# Patient Record
Sex: Female | Born: 1950 | Race: White | Hispanic: No | Marital: Married | Smoking: Never smoker
Health system: Southern US, Community
[De-identification: ages and names within clinical notes are randomized; demographics above are authoritative.]

## PROBLEM LIST (undated history)

## (undated) DIAGNOSIS — F419 Anxiety disorder, unspecified: Secondary | ICD-10-CM

## (undated) DIAGNOSIS — K5790 Diverticulosis of intestine, part unspecified, without perforation or abscess without bleeding: Secondary | ICD-10-CM

## (undated) DIAGNOSIS — M549 Dorsalgia, unspecified: Secondary | ICD-10-CM

## (undated) DIAGNOSIS — M1712 Unilateral primary osteoarthritis, left knee: Secondary | ICD-10-CM

## (undated) DIAGNOSIS — R569 Unspecified convulsions: Secondary | ICD-10-CM

## (undated) DIAGNOSIS — K589 Irritable bowel syndrome without diarrhea: Secondary | ICD-10-CM

## (undated) DIAGNOSIS — M8580 Other specified disorders of bone density and structure, unspecified site: Secondary | ICD-10-CM

## (undated) DIAGNOSIS — D509 Iron deficiency anemia, unspecified: Secondary | ICD-10-CM

## (undated) DIAGNOSIS — E782 Mixed hyperlipidemia: Secondary | ICD-10-CM

## (undated) DIAGNOSIS — G40209 Localization-related (focal) (partial) symptomatic epilepsy and epileptic syndromes with complex partial seizures, not intractable, without status epilepticus: Secondary | ICD-10-CM

## (undated) DIAGNOSIS — E559 Vitamin D deficiency, unspecified: Secondary | ICD-10-CM

## (undated) DIAGNOSIS — M1990 Unspecified osteoarthritis, unspecified site: Secondary | ICD-10-CM

## (undated) DIAGNOSIS — E538 Deficiency of other specified B group vitamins: Secondary | ICD-10-CM

## (undated) DIAGNOSIS — I10 Essential (primary) hypertension: Secondary | ICD-10-CM

## (undated) HISTORY — PX: FOOT SURGERY: SHX648

## (undated) HISTORY — DX: Diverticulosis of intestine, part unspecified, without perforation or abscess without bleeding: K57.90

## (undated) HISTORY — PX: MOUTH SURGERY: SHX715

## (undated) HISTORY — DX: Other specified disorders of bone density and structure, unspecified site: M85.80

## (undated) HISTORY — DX: Unspecified convulsions: R56.9

---

## 1981-07-27 HISTORY — PX: BREAST SURGERY: SHX581

## 1996-07-27 HISTORY — PX: AUGMENTATION MAMMAPLASTY: SUR837

## 1997-07-27 HISTORY — PX: GYNECOLOGIC CRYOSURGERY: SHX857

## 1997-07-27 HISTORY — PX: OTHER SURGICAL HISTORY: SHX169

## 2000-09-18 ENCOUNTER — Encounter: Payer: Self-pay | Admitting: Family Medicine

## 2000-09-18 LAB — CONVERTED CEMR LAB: Pap Smear: NORMAL

## 2000-10-11 ENCOUNTER — Encounter: Payer: Self-pay | Admitting: Family Medicine

## 2000-10-11 LAB — CONVERTED CEMR LAB: Blood Glucose, Fasting: 98 mg/dL

## 2000-10-18 ENCOUNTER — Other Ambulatory Visit: Admission: RE | Admit: 2000-10-18 | Discharge: 2000-10-18 | Payer: Self-pay | Admitting: Family Medicine

## 2001-11-22 ENCOUNTER — Encounter: Payer: Self-pay | Admitting: Family Medicine

## 2001-11-22 ENCOUNTER — Other Ambulatory Visit: Admission: RE | Admit: 2001-11-22 | Discharge: 2001-11-22 | Payer: Self-pay | Admitting: Family Medicine

## 2003-03-29 ENCOUNTER — Encounter: Payer: Self-pay | Admitting: Family Medicine

## 2003-03-29 ENCOUNTER — Other Ambulatory Visit: Admission: RE | Admit: 2003-03-29 | Discharge: 2003-03-29 | Payer: Self-pay | Admitting: Family Medicine

## 2003-03-29 LAB — CONVERTED CEMR LAB: Pap Smear: NORMAL

## 2003-03-30 ENCOUNTER — Encounter: Payer: Self-pay | Admitting: Family Medicine

## 2003-03-30 LAB — CONVERTED CEMR LAB: Blood Glucose, Fasting: 78 mg/dL

## 2004-04-18 ENCOUNTER — Encounter: Payer: Self-pay | Admitting: Family Medicine

## 2004-04-18 ENCOUNTER — Other Ambulatory Visit: Admission: RE | Admit: 2004-04-18 | Discharge: 2004-04-18 | Payer: Self-pay | Admitting: Family Medicine

## 2004-04-18 LAB — CONVERTED CEMR LAB
Blood Glucose, Fasting: 74 mg/dL
Pap Smear: NORMAL

## 2004-06-12 ENCOUNTER — Ambulatory Visit: Payer: Self-pay | Admitting: Family Medicine

## 2004-08-08 ENCOUNTER — Ambulatory Visit: Payer: Self-pay | Admitting: Family Medicine

## 2004-08-08 LAB — CONVERTED CEMR LAB: TSH: 1.83 microintl units/mL

## 2004-09-02 ENCOUNTER — Ambulatory Visit: Payer: Self-pay | Admitting: Family Medicine

## 2004-10-07 ENCOUNTER — Ambulatory Visit: Payer: Self-pay | Admitting: Family Medicine

## 2004-10-09 ENCOUNTER — Ambulatory Visit: Payer: Self-pay | Admitting: Family Medicine

## 2005-03-05 ENCOUNTER — Other Ambulatory Visit: Admission: RE | Admit: 2005-03-05 | Discharge: 2005-03-05 | Payer: Self-pay | Admitting: Family Medicine

## 2005-03-05 ENCOUNTER — Ambulatory Visit: Payer: Self-pay | Admitting: Family Medicine

## 2005-03-05 LAB — CONVERTED CEMR LAB: Pap Smear: ABNORMAL

## 2005-03-27 ENCOUNTER — Ambulatory Visit: Payer: Self-pay | Admitting: Family Medicine

## 2005-04-03 ENCOUNTER — Ambulatory Visit: Payer: Self-pay | Admitting: Family Medicine

## 2005-04-03 LAB — CONVERTED CEMR LAB
Blood Glucose, Fasting: 84 mg/dL
RBC count: 4.13 10*6/mm3
WBC, blood: 6.3 10*3/mm3

## 2005-04-07 ENCOUNTER — Ambulatory Visit: Payer: Self-pay | Admitting: Gastroenterology

## 2005-05-01 ENCOUNTER — Ambulatory Visit: Payer: Self-pay | Admitting: Gastroenterology

## 2005-05-01 HISTORY — PX: COLONOSCOPY: SHX174

## 2005-07-27 HISTORY — PX: LASIK: SHX215

## 2005-08-05 ENCOUNTER — Ambulatory Visit: Payer: Self-pay | Admitting: Family Medicine

## 2005-09-07 ENCOUNTER — Ambulatory Visit: Payer: Self-pay | Admitting: Family Medicine

## 2005-09-07 ENCOUNTER — Other Ambulatory Visit: Admission: RE | Admit: 2005-09-07 | Discharge: 2005-09-07 | Payer: Self-pay | Admitting: Family Medicine

## 2005-09-07 LAB — CONVERTED CEMR LAB: Pap Smear: NORMAL

## 2006-10-13 ENCOUNTER — Ambulatory Visit: Payer: Self-pay | Admitting: Family Medicine

## 2006-10-13 ENCOUNTER — Encounter (INDEPENDENT_AMBULATORY_CARE_PROVIDER_SITE_OTHER): Payer: Self-pay | Admitting: *Deleted

## 2006-10-13 ENCOUNTER — Other Ambulatory Visit: Admission: RE | Admit: 2006-10-13 | Discharge: 2006-10-13 | Payer: Self-pay | Admitting: Family Medicine

## 2006-10-13 LAB — CONVERTED CEMR LAB
ALT: 26 units/L (ref 0–40)
AST: 24 units/L (ref 0–37)
Basophils Relative: 0.2 % (ref 0.0–1.0)
Bilirubin, Direct: 0.1 mg/dL (ref 0.0–0.3)
Calcium: 9.2 mg/dL (ref 8.4–10.5)
Chloride: 106 meq/L (ref 96–112)
Creatinine, Ser: 0.6 mg/dL (ref 0.4–1.2)
Eosinophils Absolute: 0 10*3/uL (ref 0.0–0.6)
Eosinophils Relative: 0.4 % (ref 0.0–5.0)
GFR calc non Af Amer: 110 mL/min
Glucose, Bld: 88 mg/dL (ref 70–99)
HCT: 39.1 % (ref 36.0–46.0)
LDL Cholesterol: 111 mg/dL — ABNORMAL HIGH (ref 0–99)
Neutrophils Relative %: 54.6 % (ref 43.0–77.0)
Platelets: 297 10*3/uL (ref 150–400)
RBC count: 4.33 10*6/uL
RBC: 4.33 M/uL (ref 3.87–5.11)
RDW: 12.9 % (ref 11.5–14.6)
Sodium: 141 meq/L (ref 135–145)
TSH: 2.64 microintl units/mL (ref 0.35–5.50)
Total Bilirubin: 0.8 mg/dL (ref 0.3–1.2)
Total CHOL/HDL Ratio: 2.9
Triglycerides: 65 mg/dL (ref 0–149)
VLDL: 13 mg/dL (ref 0–40)
WBC: 6.3 10*3/uL (ref 4.5–10.5)

## 2006-11-11 ENCOUNTER — Ambulatory Visit: Payer: Self-pay | Admitting: Family Medicine

## 2006-12-01 ENCOUNTER — Telehealth (INDEPENDENT_AMBULATORY_CARE_PROVIDER_SITE_OTHER): Payer: Self-pay | Admitting: Internal Medicine

## 2006-12-13 ENCOUNTER — Telehealth (INDEPENDENT_AMBULATORY_CARE_PROVIDER_SITE_OTHER): Payer: Self-pay | Admitting: Internal Medicine

## 2006-12-14 ENCOUNTER — Ambulatory Visit: Payer: Self-pay | Admitting: Family Medicine

## 2007-01-10 ENCOUNTER — Telehealth (INDEPENDENT_AMBULATORY_CARE_PROVIDER_SITE_OTHER): Payer: Self-pay | Admitting: Internal Medicine

## 2007-01-12 ENCOUNTER — Ambulatory Visit: Payer: Self-pay | Admitting: Family Medicine

## 2007-01-12 DIAGNOSIS — M545 Low back pain, unspecified: Secondary | ICD-10-CM | POA: Insufficient documentation

## 2007-01-14 ENCOUNTER — Encounter: Admission: RE | Admit: 2007-01-14 | Discharge: 2007-01-14 | Payer: Self-pay | Admitting: Family Medicine

## 2007-03-09 ENCOUNTER — Telehealth (INDEPENDENT_AMBULATORY_CARE_PROVIDER_SITE_OTHER): Payer: Self-pay | Admitting: Internal Medicine

## 2007-04-04 ENCOUNTER — Encounter (INDEPENDENT_AMBULATORY_CARE_PROVIDER_SITE_OTHER): Payer: Self-pay | Admitting: Internal Medicine

## 2007-08-11 ENCOUNTER — Encounter (INDEPENDENT_AMBULATORY_CARE_PROVIDER_SITE_OTHER): Payer: Self-pay | Admitting: Internal Medicine

## 2007-11-17 ENCOUNTER — Encounter: Admission: RE | Admit: 2007-11-17 | Discharge: 2007-11-17 | Payer: Self-pay | Admitting: Orthopaedic Surgery

## 2007-12-06 ENCOUNTER — Encounter (INDEPENDENT_AMBULATORY_CARE_PROVIDER_SITE_OTHER): Payer: Self-pay | Admitting: Internal Medicine

## 2007-12-16 ENCOUNTER — Ambulatory Visit: Payer: Self-pay | Admitting: Family Medicine

## 2007-12-16 ENCOUNTER — Other Ambulatory Visit: Admission: RE | Admit: 2007-12-16 | Discharge: 2007-12-16 | Payer: Self-pay | Admitting: Family Medicine

## 2007-12-16 ENCOUNTER — Encounter (INDEPENDENT_AMBULATORY_CARE_PROVIDER_SITE_OTHER): Payer: Self-pay | Admitting: Internal Medicine

## 2007-12-19 ENCOUNTER — Ambulatory Visit: Payer: Self-pay | Admitting: Family Medicine

## 2007-12-20 ENCOUNTER — Encounter (INDEPENDENT_AMBULATORY_CARE_PROVIDER_SITE_OTHER): Payer: Self-pay | Admitting: Internal Medicine

## 2007-12-21 ENCOUNTER — Ambulatory Visit: Payer: Self-pay | Admitting: Family Medicine

## 2007-12-21 ENCOUNTER — Encounter (INDEPENDENT_AMBULATORY_CARE_PROVIDER_SITE_OTHER): Payer: Self-pay | Admitting: Internal Medicine

## 2007-12-21 DIAGNOSIS — M8580 Other specified disorders of bone density and structure, unspecified site: Secondary | ICD-10-CM

## 2007-12-21 HISTORY — DX: Other specified disorders of bone density and structure, unspecified site: M85.80

## 2007-12-22 ENCOUNTER — Encounter (INDEPENDENT_AMBULATORY_CARE_PROVIDER_SITE_OTHER): Payer: Self-pay | Admitting: Internal Medicine

## 2007-12-22 ENCOUNTER — Encounter (INDEPENDENT_AMBULATORY_CARE_PROVIDER_SITE_OTHER): Payer: Self-pay | Admitting: *Deleted

## 2007-12-23 ENCOUNTER — Encounter (INDEPENDENT_AMBULATORY_CARE_PROVIDER_SITE_OTHER): Payer: Self-pay | Admitting: *Deleted

## 2007-12-23 ENCOUNTER — Encounter (INDEPENDENT_AMBULATORY_CARE_PROVIDER_SITE_OTHER): Payer: Self-pay | Admitting: Internal Medicine

## 2007-12-27 ENCOUNTER — Encounter (INDEPENDENT_AMBULATORY_CARE_PROVIDER_SITE_OTHER): Payer: Self-pay | Admitting: Internal Medicine

## 2007-12-27 ENCOUNTER — Encounter: Payer: Self-pay | Admitting: Family Medicine

## 2007-12-27 ENCOUNTER — Encounter (INDEPENDENT_AMBULATORY_CARE_PROVIDER_SITE_OTHER): Payer: Self-pay | Admitting: *Deleted

## 2007-12-27 DIAGNOSIS — M949 Disorder of cartilage, unspecified: Secondary | ICD-10-CM

## 2007-12-27 DIAGNOSIS — M47817 Spondylosis without myelopathy or radiculopathy, lumbosacral region: Secondary | ICD-10-CM | POA: Insufficient documentation

## 2007-12-27 DIAGNOSIS — N951 Menopausal and female climacteric states: Secondary | ICD-10-CM | POA: Insufficient documentation

## 2007-12-27 DIAGNOSIS — M899 Disorder of bone, unspecified: Secondary | ICD-10-CM | POA: Insufficient documentation

## 2007-12-27 DIAGNOSIS — K5730 Diverticulosis of large intestine without perforation or abscess without bleeding: Secondary | ICD-10-CM | POA: Insufficient documentation

## 2007-12-27 DIAGNOSIS — Z978 Presence of other specified devices: Secondary | ICD-10-CM | POA: Insufficient documentation

## 2008-01-09 ENCOUNTER — Encounter: Payer: Self-pay | Admitting: Family Medicine

## 2008-02-08 ENCOUNTER — Telehealth (INDEPENDENT_AMBULATORY_CARE_PROVIDER_SITE_OTHER): Payer: Self-pay | Admitting: Internal Medicine

## 2008-02-25 DIAGNOSIS — R569 Unspecified convulsions: Secondary | ICD-10-CM | POA: Insufficient documentation

## 2008-02-27 ENCOUNTER — Other Ambulatory Visit: Payer: Self-pay

## 2008-02-27 ENCOUNTER — Emergency Department: Payer: Self-pay

## 2008-03-08 ENCOUNTER — Telehealth (INDEPENDENT_AMBULATORY_CARE_PROVIDER_SITE_OTHER): Payer: Self-pay | Admitting: Internal Medicine

## 2008-03-08 LAB — CONVERTED CEMR LAB
ALT: 22 units/L (ref 0–35)
AST: 23 units/L (ref 0–37)
Albumin: 3.9 g/dL (ref 3.5–5.2)
Alkaline Phosphatase: 34 units/L — ABNORMAL LOW (ref 39–117)
BUN: 16 mg/dL (ref 6–23)
Basophils Relative: 0.6 % (ref 0.0–1.0)
CO2: 26 meq/L (ref 19–32)
Chloride: 106 meq/L (ref 96–112)
Creatinine, Ser: 0.7 mg/dL (ref 0.4–1.2)
Eosinophils Relative: 0.9 % (ref 0.0–5.0)
Glucose, Bld: 100 mg/dL — ABNORMAL HIGH (ref 70–99)
HCT: 39.6 % (ref 36.0–46.0)
HDL: 46.5 mg/dL (ref >39.0)
Monocytes Relative: 8.8 % (ref 3.0–12.0)
Neutrophils Relative %: 49.8 % (ref 43.0–77.0)
Platelets: 279 10*3/uL (ref 150–400)
Potassium: 3.8 meq/L (ref 3.5–5.1)
RBC: 4.32 M/uL (ref 3.87–5.11)
TSH: 1.98 microintl units/mL (ref 0.35–5.50)
Total CHOL/HDL Ratio: 3.6
Total Protein: 6.8 g/dL (ref 6.0–8.3)
VLDL: 12 mg/dL (ref 0–40)
WBC: 5.3 10*3/uL (ref 4.5–10.5)

## 2008-03-20 ENCOUNTER — Encounter (INDEPENDENT_AMBULATORY_CARE_PROVIDER_SITE_OTHER): Payer: Self-pay | Admitting: Internal Medicine

## 2008-12-19 ENCOUNTER — Ambulatory Visit: Payer: Self-pay | Admitting: Family Medicine

## 2008-12-19 ENCOUNTER — Other Ambulatory Visit: Admission: RE | Admit: 2008-12-19 | Discharge: 2008-12-19 | Payer: Self-pay | Admitting: Family Medicine

## 2008-12-19 ENCOUNTER — Encounter (INDEPENDENT_AMBULATORY_CARE_PROVIDER_SITE_OTHER): Payer: Self-pay | Admitting: Internal Medicine

## 2008-12-20 ENCOUNTER — Telehealth (INDEPENDENT_AMBULATORY_CARE_PROVIDER_SITE_OTHER): Payer: Self-pay | Admitting: Internal Medicine

## 2008-12-20 LAB — CONVERTED CEMR LAB
ALT: 20 units/L (ref 0–35)
AST: 23 units/L (ref 0–37)
Albumin: 4.2 g/dL (ref 3.5–5.2)
Alkaline Phosphatase: 36 units/L — ABNORMAL LOW (ref 39–117)
BUN: 17 mg/dL (ref 6–23)
Basophils Relative: 0.6 % (ref 0.0–3.0)
Calcium: 9.3 mg/dL (ref 8.4–10.5)
Chloride: 111 meq/L (ref 96–112)
Cholesterol: 172 mg/dL (ref 0–200)
Creatinine, Ser: 0.7 mg/dL (ref 0.4–1.2)
Hemoglobin: 13.8 g/dL (ref 12.0–15.0)
LDL Cholesterol: 100 mg/dL — ABNORMAL HIGH (ref 0–99)
Lymphocytes Relative: 31.3 % (ref 12.0–46.0)
MCHC: 34 g/dL (ref 30.0–36.0)
Monocytes Relative: 9.1 % (ref 3.0–12.0)
Neutro Abs: 4 10*3/uL (ref 1.4–7.7)
RBC: 4.42 M/uL (ref 3.87–5.11)
Total CHOL/HDL Ratio: 3
Total Protein: 7.1 g/dL (ref 6.0–8.3)

## 2008-12-24 ENCOUNTER — Encounter (INDEPENDENT_AMBULATORY_CARE_PROVIDER_SITE_OTHER): Payer: Self-pay | Admitting: Internal Medicine

## 2008-12-24 ENCOUNTER — Ambulatory Visit: Payer: Self-pay | Admitting: Family Medicine

## 2008-12-27 ENCOUNTER — Encounter (INDEPENDENT_AMBULATORY_CARE_PROVIDER_SITE_OTHER): Payer: Self-pay | Admitting: Internal Medicine

## 2009-01-01 ENCOUNTER — Ambulatory Visit: Payer: Self-pay | Admitting: Family Medicine

## 2009-01-02 ENCOUNTER — Encounter (INDEPENDENT_AMBULATORY_CARE_PROVIDER_SITE_OTHER): Payer: Self-pay | Admitting: Internal Medicine

## 2009-07-27 HISTORY — PX: ROTATOR CUFF REPAIR: SHX139

## 2009-08-16 ENCOUNTER — Encounter: Admission: RE | Admit: 2009-08-16 | Discharge: 2009-08-16 | Payer: Self-pay | Admitting: Neurosurgery

## 2009-09-10 ENCOUNTER — Telehealth: Payer: Self-pay | Admitting: Family Medicine

## 2009-10-07 ENCOUNTER — Telehealth: Payer: Self-pay | Admitting: Family Medicine

## 2009-12-31 ENCOUNTER — Other Ambulatory Visit: Admission: RE | Admit: 2009-12-31 | Discharge: 2009-12-31 | Payer: Self-pay | Admitting: Family Medicine

## 2009-12-31 ENCOUNTER — Ambulatory Visit: Payer: Self-pay | Admitting: Family Medicine

## 2010-01-02 ENCOUNTER — Telehealth: Payer: Self-pay | Admitting: Family Medicine

## 2010-01-02 LAB — CONVERTED CEMR LAB
AST: 24 units/L (ref 0–37)
Alkaline Phosphatase: 46 units/L (ref 39–117)
BUN: 14 mg/dL (ref 6–23)
Direct LDL: 135.4 mg/dL
GFR calc non Af Amer: 87.85 mL/min (ref >60)
HDL: 73.2 mg/dL (ref >39.00)
Potassium: 4.2 meq/L (ref 3.5–5.1)
Sodium: 141 meq/L (ref 135–145)
Total Bilirubin: 0.6 mg/dL (ref 0.3–1.2)
VLDL: 12.8 mg/dL (ref 0.0–40.0)

## 2010-02-03 ENCOUNTER — Encounter: Payer: Self-pay | Admitting: Family Medicine

## 2010-02-03 ENCOUNTER — Ambulatory Visit: Payer: Self-pay | Admitting: Family Medicine

## 2010-02-06 ENCOUNTER — Ambulatory Visit: Payer: Self-pay | Admitting: Family Medicine

## 2010-02-06 ENCOUNTER — Encounter: Payer: Self-pay | Admitting: Family Medicine

## 2010-02-06 DIAGNOSIS — R928 Other abnormal and inconclusive findings on diagnostic imaging of breast: Secondary | ICD-10-CM | POA: Insufficient documentation

## 2010-02-18 ENCOUNTER — Encounter: Payer: Self-pay | Admitting: Family Medicine

## 2010-03-27 ENCOUNTER — Ambulatory Visit: Payer: Self-pay | Admitting: Family Medicine

## 2010-04-03 ENCOUNTER — Telehealth: Payer: Self-pay | Admitting: Family Medicine

## 2010-04-15 ENCOUNTER — Encounter (INDEPENDENT_AMBULATORY_CARE_PROVIDER_SITE_OTHER): Payer: Self-pay | Admitting: *Deleted

## 2010-05-16 ENCOUNTER — Ambulatory Visit: Payer: Self-pay | Admitting: Family Medicine

## 2010-05-16 DIAGNOSIS — H9209 Otalgia, unspecified ear: Secondary | ICD-10-CM | POA: Insufficient documentation

## 2010-05-16 DIAGNOSIS — R519 Headache, unspecified: Secondary | ICD-10-CM | POA: Insufficient documentation

## 2010-05-16 DIAGNOSIS — R51 Headache: Secondary | ICD-10-CM | POA: Insufficient documentation

## 2010-05-21 ENCOUNTER — Telehealth: Payer: Self-pay | Admitting: Family Medicine

## 2010-06-04 ENCOUNTER — Telehealth: Payer: Self-pay | Admitting: Family Medicine

## 2010-08-26 NOTE — Progress Notes (Signed)
Summary: needs prempro  Phone Note Call from Patient Call back at Home Phone [phone]   Caller: Patient Call For: Dr. Dayton Martes Summary of Call: Pt stopped prempro last month but has decided she needs to go back on it, cant deal with the hot flashes.  She has a physical appt with you on 6/7 and she is asking if she can have enough refills to last till then.  [facility]. Initial call taken by: Lowella Petties CMA,  October 07, 2009 12:40 PM  Follow-up for Phone Call        yes, please refill until she is seen. Follow-up by: Ruthe Mannan MD,  October 07, 2009 12:42 PM    Prescriptions: PREMPRO 0.45-1.5 MG  TABS (CONJ ESTROG-MEDROXYPROGEST ACE) `1 once daily fpr HRT by mouth  #1pk x 6   Entered by:   Delilah Shan CMA (AAMA)   Authorized by:   Ruthe Mannan MD   Signed by:   Delilah Shan CMA (AAMA) on 10/07/2009   Method used:   [facility]        [facility] [phone][facility] (retail)       [address]       [place], [place]  [address]       Ph: [phone]       Fax: [phone]   RxID:   [id number]

## 2010-08-26 NOTE — Assessment & Plan Note (Signed)
Summary: EAR/CLE   Vital Signs:  Patient profile:   60 year old female Height:      66.25 inches Weight:      172 pounds BMI:     27.65 Temp:     97.7 degrees F oral Pulse rate:   76 / minute Pulse rhythm:   regular BP sitting:   130 / 76  (right arm) Cuff size:   regular  Vitals Entered By: Linde Gillis CMA Duncan Dull) (May 16, 2010 3:19 PM) CC: left ear pain   History of Present Illness: 60 yo here for left ear pain x 2 weeks. Had some URI symptoms but they have resolved, still having some left ear pain. No discharge, hearing loss. Noticed her left temple was hurting this morning too. No pain with chewing. No fevers, chills or other symptoms.    Never had anything like this before.   No sensitivity to light.  Current Medications (verified): 1)  Prempro 0.45-1.5 Mg  Tabs (Conj Estrog-Medroxyprogest Ace) .... `1 Once Daily Fpr Hrt By Mouth 2)  Asa 81 Mg .Marland Kitchen.. 1 Once Daily 3)  Calcium Carbonate-Vitamin D 600-400 Mg-Unit  Tabs (Calcium Carbonate-Vitamin D) .... One Daily 4)  Multivitamins   Tabs (Multiple Vitamin) .... One Daily 5)  Lamictal Xr 100 Mg Xr24h-Tab (Lamotrigine) .... One Three Times A Day 6)  Azithromycin 250 Mg  Tabs (Azithromycin) .... 2 By  Mouth Today and Then 1 Daily For 4 Days  Allergies: 1)  ! Pcn 2)  ! * Shellfish 3)  ! * Shrimp  Past History:  Past Medical History: Last updated: 12/27/2007 Diverticulosis, colon:(04/2005) Osteopenia:(12/21/2007)  Past Surgical History: Last updated: 03/08/2008 NSVD X 1 SON BILATERAL BREAST IMPLANTS :(1983) CERVICAL DISEASE-- H/O NGEX:(5284) lasik bilat 2007 COLONOSCOPY --DIVERICS:(05/01/2005) DEXA SCAN --ABNORMAL:(12/21/2007) MRI and EEG after seizure 8/09--sees Dr Thad Ranger, neurology  Family History: Last updated: 2009/01/11 Father: DECEASED AT 64 GUN SHOT WOUND Mother: ALIVE AT 59 YOA SHINGLES, skin cancers--11/2008 Siblings: 1 OLDER SISTER/  1 TWIN SISTER CV: NEGATIVE HBP: NEGATIVE DM:  NEGATIVE GOUT/ARTHRITIS:  PROSTATE CANCER: BREAST/OVARIAN/UTERINE CANCER: NEGATIVE COLON CANCER: DEPRESSION: NEGATIVE ETOH/DRUG ABUSE: NEGATIVE OTHER STROKE NEGATIVE  Social History: Last updated: Jan 11, 2009 Marital Status: Married Children: 1 son --11/2008--new grand daughter 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 ENT:  Complains of earache; denies ear discharge, nasal congestion, and postnasal drainage.  Physical Exam  General:  alert, well-developed, well-nourished, and well-hydrated.   Head:  Previous scars from facial surgery, non tender to palp over left temple. Ears:  mild clear fluid behind left TM, right TM normal Nose:  External nasal examination shows no deformity or inflammation. Nasal mucosa are pink and moist without lesions or exudates. Mouth:  Oral mucosa and oropharynx without lesions or exudates.  Teeth in ok repair, no obvious abscess. Lungs:  normal respiratory effort, no intercostal retractions, no accessory muscle use, and normal breath sounds.   Heart:  normal rate, regular rhythm, and no murmur.   Extremities:  no edema either lower leg Psych:  normally interactive and subdued.     Impression & Recommendations:  Problem # 1:  EAR PAIN, LEFT (ICD-388.70) Assessment New LIkely due to URI, fluid behind ear drum. Will check ESR to rule out TA although very unlikely  based on exam. Her updated medication list for this problem includes:    Azithromycin 250 Mg Tabs (Azithromycin) .Marland Kitchen... 2 by  mouth today and then 1 daily for 4 days  Complete Medication List: 1)  Prempro 0.45-1.5 Mg Tabs (Conj estrog-medroxyprogest ace) .... `1 once daily fpr hrt by mouth 2)  Asa 81 Mg  .Marland Kitchen.. 1 once  daily 3)  Calcium Carbonate-vitamin D 600-400 Mg-unit Tabs (Calcium carbonate-vitamin d) .... One daily 4)  Multivitamins Tabs (Multiple vitamin) .... One daily 5)  Lamictal Xr 100 Mg Xr24h-tab (Lamotrigine) .... One three times a day 6)  Azithromycin 250 Mg Tabs (Azithromycin) .... 2 by  mouth today and then 1 daily for 4 days  Other Orders: Specimen Handling (64332) T-Sed Rate (Automated) (95188-41660) Prescriptions: AZITHROMYCIN 250 MG  TABS (AZITHROMYCIN) 2 by  mouth today and then 1 daily for 4 days  #6 x 0   Entered and Authorized by:   Ruthe Mannan MD   Signed by:   Ruthe Mannan MD on 05/16/2010   Method used:   Print then Give to Patient   RxID:   [id number]    Orders Added: 1)  Specimen Handling [99000] 2)  T-Sed Rate (Automated) [22025-42706] 3)  Est. Patient Level IV [23762]    Current Allergies (reviewed today): ! PCN ! * SHELLFISH ! * SHRIMP

## 2010-08-26 NOTE — Miscellaneous (Signed)
Summary: Orders Update  Clinical Lists Changes  Problems: Added new problem of MAMMOGRAM, ABNORMAL (ICD-793.80) Orders: Added new Referral order of Radiology Referral (Radiology) - Signed 

## 2010-08-26 NOTE — Progress Notes (Signed)
Summary: wants diet information  Phone Note Call from Patient   Caller: Patient Call For: Lisa Mannan MD Summary of Call: Pt is requesting that information be mailed to her regarding what foods she should and should not eat to help control her cholesterol.  Please mail to her home address. Initial call taken by: Lowella Petties CMA,  January 02, 2010 8:59 AM  Follow-up for Phone Call        In my box. Lisa Mannan MD  January 02, 2010 9:15 AM  Information mailed to patient's home address.  Follow-up by: Linde Gillis CMA Duncan Dull),  January 02, 2010 9:18 AM

## 2010-08-26 NOTE — Progress Notes (Signed)
Summary: update on ear pain   Phone Note Call from Patient Call back at Home Phone [phone] Call back at [phone]   Caller: Patient Call For: Ruthe Mannan MD Summary of Call: Patient was here last week for her ears. Paient called to let you know that she went to the dentist and they think that it is  a nerve and they gave her something for the inflammation. She was told that she may need to have a root canal, but at this point they feel that it is just the inflammation causing the pain.  Initial call taken by: Melody Comas,  May 21, 2010 2:35 PM  Follow-up for Phone Call        ok thank you. Ruthe Mannan MD  May 22, 2010 7:39 AM

## 2010-08-26 NOTE — Miscellaneous (Signed)
Summary: got zostavax at [facility]   Clinical Lists Changes  Observations: Added new observation of ZOSTAVAX: Zostavax (04/07/2010 10:23)      Immunization History:  Zostavax History:    Zostavax # 1:  zostavax (04/07/2010)  Zostavax vaccine received at [facility] pharmacy.                  Lowella Petties CMA  April 15, 2010 10:24 AM

## 2010-08-26 NOTE — Progress Notes (Signed)
Summary: wants order for zostavax  Phone Note Call from Patient   Caller: Patient Call For: Lisa Mannan MD Summary of Call: Pt is asking for order for zostavax to be sent to [facility].  She said she will pay for this if her insurance does not, [facility] charges $209.00. Initial call taken by: Lowella Petties CMA,  April 03, 2010 3:35 PM  Follow-up for Phone Call        In my box. Lisa Mannan MD  April 03, 2010 3:41 PM  Patient advised as instructed via telephone.  Rx for Zostavax vaccine was mailed to patient's home address per her request. Follow-up by: Linde Gillis CMA Duncan Dull),  April 03, 2010 4:29 PM

## 2010-08-26 NOTE — Assessment & Plan Note (Signed)
Summary: CPX--XFERED FROM BILLIE  CYD   Vital Signs:  Patient profile:   60 year old female Height:      66.25 inches Weight:      166.50 pounds BMI:     26.77 Temp:     98.6 degrees F oral Pulse rate:   64 / minute Pulse rhythm:   regular BP sitting:   120 / 80  (right arm) Cuff size:   regular  Vitals Entered By: Linde Gillis CMA Duncan Dull) (December 31, 2009 9:28 AM) CC: complete physical   History of Present Illness: 60 yo new to me here for CPX.  Well woman- Menopausal since 2004, has not had a hysterectomy.  Has been on Prempro   0.45-1.5 x76yr.  Tried to come off of it earlier this year but hot flashes became too severe.  Restarted it and feel much better now.   Due for pap, mammogram. UTD colonoscopy, tetanus, DEXA.  Seizure disorder- had one seizure on 02/27/08--sees Dr Thad Ranger, neurologist.  Sees them every three months, checks her levels and LFTs.  Currently taking 400 mg of Lamictal because was still having auras on lower dose.  Problems Prior to Update: 1)  Other Screening Mammogram  (ICD-V76.12) 2)  Screening For Malignant Neoplasm of The Cervix  (ICD-V76.2) 3)  Special Screening Malig Neoplasms Other Sites  (ICD-V76.49) 4)  Seizure Disorder  (ICD-780.39) 5)  Spondylosis, Lumbar  (ICD-721.3) 6)  Osteopenia  (ICD-733.90) 7)  Diverticulosis, Colon  (ICD-562.10) 8)  Breast Implants, Bilateral, Hx of  (ICD-V43.82) 9)  Well Adult Exam  (ICD-V70.0) 10)  Low Back Pain, Acute  (ICD-724.2) 11)  State, Symptomatic Menopause/fem Climacteric  (ICD-627.2)  Medications Prior to Update: 1)  Theratramadol-60 50 Mg  Misc (Tramadol Hcl-Nutritional Supl) .... 21 Q6h As Needed Pain 2)  Prempro 0.45-1.5 Mg  Tabs (Conj Estrog-Medroxyprogest Ace) .... `1 Once Daily Fpr Hrt By Mouth 3)  Asa 81 Mg .Marland Kitchen.. 1 Once Daily 4)  Calcium Carbonate-Vitamin D 600-400 Mg-Unit  Tabs (Calcium Carbonate-Vitamin D) .... One Daily 5)  Multivitamins   Tabs (Multiple Vitamin) .... One Daily 6)  Lamictal Xr 100  Mg Xr24h-Tab (Lamotrigine) .... One Three Times A Day 7)  Prempro 0.3-1.5 Mg Tabs (Conj Estrog-Medroxyprogest Ace) .Marland Kitchen.. 1 Daily For Hormone Replacement  Current Medications (verified): 1)  Prempro 0.45-1.5 Mg  Tabs (Conj Estrog-Medroxyprogest Ace) .... `1 Once Daily Fpr Hrt By Mouth 2)  Asa 81 Mg .Marland Kitchen.. 1 Once Daily 3)  Calcium Carbonate-Vitamin D 600-400 Mg-Unit  Tabs (Calcium Carbonate-Vitamin D) .... One Daily 4)  Multivitamins   Tabs (Multiple Vitamin) .... One Daily 5)  Lamictal Xr 100 Mg Xr24h-Tab (Lamotrigine) .... One Three Times A Day  Allergies: 1)  ! Pcn 2)  ! * Shellfish 3)  ! * Shrimp  Past History:  Past Medical History: Last updated: 12/27/2007 Diverticulosis, colon:(04/2005) Osteopenia:(12/21/2007)  Past Surgical History: Last updated: 03/08/2008 NSVD X 1 SON BILATERAL BREAST IMPLANTS :(1983) CERVICAL DISEASE-- H/O ZOXW:(9604) lasik bilat 2007 COLONOSCOPY --DIVERICS:(05/01/2005) DEXA SCAN --ABNORMAL:(12/21/2007) MRI and EEG after seizure 8/09--sees Dr Thad Ranger, neurology  Family History: Last updated: Dec 21, 2008 Father: DECEASED AT 15 GUN SHOT WOUND Mother: ALIVE AT 77 YOA SHINGLES, skin cancers--11/2008 Siblings: 1 OLDER SISTER/  1 TWIN SISTER CV: NEGATIVE HBP: NEGATIVE DM: NEGATIVE GOUT/ARTHRITIS:  PROSTATE CANCER: BREAST/OVARIAN/UTERINE CANCER: NEGATIVE COLON CANCER: DEPRESSION: NEGATIVE ETOH/DRUG ABUSE: NEGATIVE OTHER STROKE NEGATIVE  Social History: Last updated: 12/21/08 Marital Status: Married Children: 1 son --11/2008--new grand daughter 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 Father: DECEASED AT 50 GUN SHOT WOUND Mother: ALIVE AT 67 YOA  SHINGLES, skin cancers--11/2008 Siblings: 1 OLDER SISTER/  1 TWIN SISTER CV: NEGATIVE HBP: NEGATIVE DM: NEGATIVE GOUT/ARTHRITIS:  PROSTATE CANCER: BREAST/OVARIAN/UTERINE CANCER: NEGATIVE COLON CANCER: DEPRESSION: NEGATIVE ETOH/DRUG ABUSE: NEGATIVE OTHER STROKE NEGATIVE  Social History: Reviewed history from 12/19/2008 and no changes required. 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 Eyes:  Denies blurring. ENT:  Denies difficulty swallowing. CV:  Denies chest pain or discomfort and difficulty breathing at night. Resp:  Denies shortness of breath. GI:  Denies abdominal pain, bloody stools, and change in bowel habits. GU:  Denies abnormal vaginal bleeding. MS:  Denies joint pain, joint redness, and joint swelling. Derm:  Denies poor wound healing. Neuro:  Denies falling down. Psych:  Denies anxiety and depression. Endo:  Denies cold intolerance and heat intolerance.  Physical Exam  General:  alert, well-developed, well-nourished, and well-hydrated.   Head:  normocephalic.   Eyes:  no injection.   Ears:  R ear normal and L ear normal.   Nose:  External nasal examination shows no deformity or inflammation. Nasal mucosa are pink and moist without lesions or exudates. Mouth:  Oral mucosa and oropharynx without lesions or exudates.  Teeth in good repair. Neck:  No deformities, masses, or tenderness noted. Lungs:  normal respiratory effort, no intercostal retractions, no accessory muscle use, and normal breath sounds.   Heart:  normal rate, regular rhythm, and no murmur.   Abdomen:  soft, non-tender, normal bowel sounds, no distention, no masses, no guarding, no abdominal hernia, no inguinal hernia, no hepatomegaly, and no splenomegaly.   Genitalia:  Pelvic Exam:        External:  normal female genitalia without lesions or masses        Vagina: normal without lesions or masses        Cervix: normal without lesions or masses        Adnexa: normal bimanual exam without masses or fullness        Uterus: normal by palpation        Pap smear: performed Msk:  no joint swelling, no joint warmth, and no redness over joints.   Extremities:  no edema either lower leg Neurologic:  alert & oriented X3, sensation intact to light touch, and gait normal.   Skin:  turgor normal, color normal, and no rashes.   Psych:  normally interactive and subdued.     Impression & Recommendations:  Problem # 1:  Preventive Health Care (ICD-V70.0) Reviewed preventive care protocols, scheduled due services, and updated immunizations Discussed nutrition, exercise, diet, and healthy lifestyle.  Pap today. Set up mammogram today. FLP, hepatic panel, BMET today.  Complete Medication List: 1)  Prempro 0.45-1.5 Mg Tabs (Conj estrog-medroxyprogest ace) .... `1 once daily fpr hrt by mouth 2)  Asa 81 Mg  .Marland Kitchen.. 1 once daily 3)  Calcium Carbonate-vitamin D 600-400 Mg-unit Tabs (Calcium carbonate-vitamin d) .... One daily 4)  Multivitamins Tabs (Multiple vitamin) .... One daily 5)  Lamictal Xr 100 Mg Xr24h-tab (Lamotrigine) .... One three times a day  Other Orders: Pap Smear, Thin Prep ( Collection of) (E4540) Venipuncture (98119) TLB-Lipid Panel (80061-LIPID) TLB-BMP (Basic Metabolic Panel-BMET) (80048-METABOL) TLB-Hepatic/Liver Function Pnl (80076-HEPATIC) Radiology Referral (Radiology)  Patient Instructions: 1)  Great to meet you. 2)  Please stop by to see [name] on your way out to set up your mammogram.  Current Allergies (reviewed today): ! PCN ! * SHELLFISH ! * SHRIMP

## 2010-08-26 NOTE — Progress Notes (Signed)
Summary: wants abx  Phone Note Call from Patient Call back at [phone]   Caller: Patient Call For: Ruthe Mannan MD Summary of Call: Pt says she has a sore throat and a head cold and is asking for a z-pack.  I advised her that she will need to be seen to determine if she needs an abx or if she has a virus.  She said she was in on 10/21 with ear pain, which is better now.  She wanted me to ask you about the abx anyway. Initial call taken by: Lowella Petties CMA, AAMA,  June 04, 2010 12:36 PM  Follow-up for Phone Call        I cannot do that.  I gave her a Zpack on 10/21.  Has to be seen. Ruthe Mannan MD  June 04, 2010 1:46 PM  Advised pt, she declined appt. Follow-up by: Lowella Petties CMA, AAMA,  June 04, 2010 2:29 PM

## 2010-08-26 NOTE — Progress Notes (Signed)
Summary: can pt stop prempro  Phone Note Call from Patient Call back at [phone]   Caller: Patient Summary of Call: Billie had decreased pt's prempro dosage last may and pt is asking if she can just stop it now.  Please advise. Initial call taken by: Lowella Petties CMA,  September 10, 2009 1:08 PM  Follow-up for Phone Call        yes- just go ahead and stop it Follow-up by: Judith Part MD,  September 10, 2009 1:45 PM  Additional Follow-up for Phone Call Additional follow up Details #1::        Patient Advised.  Additional Follow-up by: Delilah Shan CMA (AAMA),  September 10, 2009 3:10 PM

## 2010-08-26 NOTE — Assessment & Plan Note (Signed)
Summary: ST,FEVER,COUGH,CONGESTION/CLE   Vital Signs:  Patient profile:   60 year old female Height:      66.25 inches Weight:      168.8 pounds BMI:     27.14 Temp:     98.6 degrees F oral Pulse rate:   72 / minute Pulse rhythm:   regular BP sitting:   110 / 70  (left arm) Cuff size:   regular  Vitals Entered By: Benny Lennert CMA Duncan Dull) (March 27, 2010 11:39 AM)  History of Present Illness: Chief complaint sore throat,fever,cough,and congestion  Acute Visit History:      The patient complains of cough, fever, headache, nasal discharge, sinus problems, and sore throat.  These symptoms began 6 days ago.  She denies chest pain, earache, eye symptoms, nausea, and vomiting.        The character of the cough is described as productive.  She has no history of COPD.  There is no history of wheezing, sleep interference, shortness of breath, respiratory retractions, tachypnea, cyanosis, or interference with oral intake associated with her cough.        'Cold' or URI symptoms have been present with the sore throat.  There is no history of dysphagia, drooling, or recent exposure to strep.        She complains of sinus pressure, nasal congestion, purulent drainage, and frontal headache.        Urine output has been normal.  She is tolerating clear liquids.        Allergies: 1)  ! Pcn 2)  ! * Shellfish 3)  ! * Shrimp  Past History:  Past medical, surgical, family and social histories (including risk factors) reviewed, and no changes noted (except as noted below).  Past Medical History: Reviewed history from 12/27/2007 and no changes required. Diverticulosis, colon:(04/2005) Osteopenia:(12/21/2007)  Past Surgical History: Reviewed history from 03/08/2008 and no changes required. NSVD X 1 SON BILATERAL BREAST IMPLANTS :(1983) CERVICAL DISEASE-- H/O FAOZ:(3086) lasik bilat 2007 COLONOSCOPY --DIVERICS:(05/01/2005) DEXA SCAN --ABNORMAL:(12/21/2007) MRI and EEG after seizure  8/09--sees Dr Thad Ranger, neurology  Family History: Reviewed history from 12/19/2008 and no changes required. Father: DECEASED AT 69 GUN SHOT WOUND Mother: ALIVE AT 87 YOA SHINGLES, skin cancers--11/2008 Siblings: 1 OLDER SISTER/  1 TWIN SISTER CV: NEGATIVE HBP: NEGATIVE DM: NEGATIVE GOUT/ARTHRITIS:  PROSTATE CANCER: BREAST/OVARIAN/UTERINE CANCER: NEGATIVE COLON CANCER: DEPRESSION: NEGATIVE ETOH/DRUG ABUSE: NEGATIVE OTHER STROKE NEGATIVE  Social History: Reviewed history from 12/19/2008 and no changes required. 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 CV: No chest pain or SOB GI: No noted N or V Otherwise, pertinent positives and negatives are noted in the HPI.   Physical Exam  Additional Exam:  GEN: A and O x 3. WDWN. NAD.    ENT: Nose clear, ext NML.  No LAD.  No JVD.  TM's clear. Oropharynx clear.  PULM: Normal WOB, no distress. No crackles, wheezes, rhonchi. CV: RRR, no M/G/R, No rubs, No JVD.   ABD: S, NT, ND, + BS. No rebound. No guarding. No HSM.   EXT: warm and well-perfused, No c/c/e. PSYCH: Pleasant and conversant.    Impression & Recommendations:  Problem # 1:  BRONCHITIS- ACUTE (ICD-466.0) Assessment New more than likely viral, will treat with Tessalon Perles. Chloraseptic Spray as needed. If not improving over the weekend, fill  Zpak  Her updated medication list for this problem includes:    Azithromycin 250 Mg Tabs (Azithromycin) .Marland Kitchen... 2 by  mouth today and then 1 daily for 4 days    Tessalon 200 Mg Caps (Benzonatate) .Marland Kitchen... Take one capsule by mouth three times a day as needed for cough  Problem # 2:  PHARYNGITIS-ACUTE (ICD-462) Assessment: New  Her updated medication list for this problem includes:    Azithromycin 250 Mg Tabs (Azithromycin) .Marland Kitchen... 2 by   mouth today and then 1 daily for 4 days  Complete Medication List: 1)  Prempro 0.45-1.5 Mg Tabs (Conj estrog-medroxyprogest ace) .... `1 once daily fpr hrt by mouth 2)  Asa 81 Mg  .Marland Kitchen.. 1 once daily 3)  Calcium Carbonate-vitamin D 600-400 Mg-unit Tabs (Calcium carbonate-vitamin d) .... One daily 4)  Multivitamins Tabs (Multiple vitamin) .... One daily 5)  Lamictal Xr 100 Mg Xr24h-tab (Lamotrigine) .... One three times a day 6)  Azithromycin 250 Mg Tabs (Azithromycin) .... 2 by  mouth today and then 1 daily for 4 days 7)  Tessalon 200 Mg Caps (Benzonatate) .... Take one capsule by mouth three times a day as needed for cough Prescriptions: TESSALON 200 MG CAPS (BENZONATATE) Take one capsule by mouth three times a day as needed for cough  #40 x 0   Entered and Authorized by:   Hannah Beat MD   Signed by:   Hannah Beat MD on 03/27/2010   Method used:   [facility]  [address] [phone][facility]       [address]       [place], [place]  [address]       Ph: [phone]       Fax: [phone]   RxID:   [id number] AZITHROMYCIN 250 MG  TABS (AZITHROMYCIN) 2 by  mouth today and then 1 daily for 4 days  #6 x 0   Entered and Authorized by:   Hannah Beat MD   Signed by:   Hannah Beat MD on 03/27/2010   Method used:   [facility]        [facility]  [address] [phone][facility] (retail)       [address]       [place], [place]  [address]       Ph: [phone]       Fax: [phone]   RxID:   [id number]   Current Allergies (reviewed today): ! PCN ! * SHELLFISH ! * SHRIMP

## 2010-08-27 ENCOUNTER — Encounter: Payer: Self-pay | Admitting: Family Medicine

## 2010-09-11 NOTE — Letter (Signed)
Summary: Lab Records Request  Lab Records Request   Imported By: Kassie Mends 09/03/2010 08:27:28  _____________________________________________________________________  External Attachment:    Type:   Image     Comment:   External Document

## 2011-01-07 ENCOUNTER — Encounter: Payer: Self-pay | Admitting: Family Medicine

## 2011-01-07 ENCOUNTER — Ambulatory Visit (INDEPENDENT_AMBULATORY_CARE_PROVIDER_SITE_OTHER): Payer: Managed Care, Other (non HMO) | Admitting: Family Medicine

## 2011-01-07 VITALS — BP 130/80 | HR 88 | Temp 98.7°F | Ht 66.5 in | Wt 176.0 lb

## 2011-01-07 DIAGNOSIS — J029 Acute pharyngitis, unspecified: Secondary | ICD-10-CM | POA: Insufficient documentation

## 2011-01-07 DIAGNOSIS — R300 Dysuria: Secondary | ICD-10-CM | POA: Insufficient documentation

## 2011-01-07 LAB — POCT URINALYSIS DIPSTICK
Bilirubin, UA: NEGATIVE
Glucose, UA: NEGATIVE
Nitrite, UA: NEGATIVE
Spec Grav, UA: 1.015

## 2011-01-07 LAB — POCT RAPID STREP A (OFFICE): Rapid Strep A Screen: NEGATIVE

## 2011-01-07 MED ORDER — CIPROFLOXACIN HCL 250 MG PO TABS: 250.0000 mg | ORAL_TABLET | Freq: Two times a day (BID) | ORAL | Status: AC

## 2011-01-07 MED ORDER — DEXAMETHASONE SODIUM PHOSPHATE 10 MG/ML IJ SOLN
10.0000 mg | Freq: Once | INTRAMUSCULAR | Status: AC
  Administered 2011-01-07: 10 mg via INTRAMUSCULAR

## 2011-01-07 MED ORDER — PREDNISONE 20 MG PO TABS: 20.0000 mg | ORAL_TABLET | Freq: Every day | ORAL | Status: AC

## 2011-01-07 NOTE — Progress Notes (Signed)
60 yo walked into clinic today for:    Sore throat- she was worried it was an allergic reaction. 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  Has not had either one recently. Last 3 days, throat very sore, painful to swallow. Has been a little congested as well. No hives, throat not feeling like it is closing shut, no wheezing. No fevers, but has felt a little achy. Also having some dysuria.  Had right great toe nail removed over a week ago, has a little rash on the toe now.    No back pain, no SOB.  ROS: See HPI  The PMH, PSH, Social History, Family History, Medications, and allergies have been reviewed in [facility], and have been updated if relevant.  Physical exam: BP 130/80  Pulse 88  Temp(Src) 98.7 F (37.1 C) (Oral)  Ht 5' 6.5" (1.689 m)  Wt 176 lb (79.833 kg)  BMI 27.98 kg/m2 Gen:  Alert, pleasant, NAD HEENT:  Throat is erythematous and edematous, +exudate, no midline shift  Lymph:  + cervical adenopathy Resp: CTA bilaterally CVS:  RRR Abd:  Soft, NT, NO CVA tenderness Skin:  Small papules on feet, palms  1. Pharyngitis -  New.  Rapid strep negative.  Does not appear consistent with allergic reaction. ?viral etiology, ?hand-foot-mouth since she takes care of her grand daughter.  Given IM decadron in office to help with pain/swelling today. Will give 4 more days of prednisone, red flags given requiring immediate follow up, such as difficulty swallowing. POCT rapid strep A    2. Dysuria - Unable to give urine sample now, will drop off later this afternoon for urinalysis.

## 2011-01-07 NOTE — Progress Notes (Signed)
Addended by: Dianne Dun on: 01/07/2011 11:52 AM   Modules accepted: Orders

## 2011-01-07 NOTE — Progress Notes (Signed)
Addended by: Gilmer Mor on: 01/07/2011 11:57 AM   Modules accepted: Orders

## 2011-01-07 NOTE — Patient Instructions (Signed)
Please take one tablet of prednisone 20 mg daily for next four mornings, starting tomorrow. Continue salt water gargles. If your symptoms worsen, please call us or go to the ER immediately.

## 2011-01-08 ENCOUNTER — Telehealth: Payer: Self-pay | Admitting: *Deleted

## 2011-01-08 NOTE — Telephone Encounter (Signed)
Patient advised as instructed via telephone.  She stated that she didn't get much sleep last night and that is probably what is causing the swelling above her eyes.  She stated that she would finish the Prednisone since she only has three pills left.

## 2011-01-08 NOTE — Telephone Encounter (Signed)
She can stop taking the prednisone if she thinks this is a reaction. Please ask her to take Benadryl 25 mg twice a day for next couple of days.

## 2011-01-08 NOTE — Telephone Encounter (Signed)
Pt was seen yesterday and given decadron injection and prednisone.  She says she is having a little bit of swelling above her eye and she is concerned that this could be caused by the prednisone.  She said that you told her that if she had any problems she could walk in and you would see her, but I asked her not to do that- to call first, and that you would not be in until tomorrow afternoon.

## 2011-01-09 LAB — URINE CULTURE: Colony Count: 25000

## 2011-01-23 ENCOUNTER — Ambulatory Visit (INDEPENDENT_AMBULATORY_CARE_PROVIDER_SITE_OTHER): Payer: Managed Care, Other (non HMO) | Admitting: Family Medicine

## 2011-01-23 ENCOUNTER — Encounter: Payer: Self-pay | Admitting: Family Medicine

## 2011-01-23 VITALS — BP 130/80 | HR 72 | Temp 98.7°F | Ht 66.5 in | Wt 166.0 lb

## 2011-01-23 DIAGNOSIS — J069 Acute upper respiratory infection, unspecified: Secondary | ICD-10-CM

## 2011-01-23 DIAGNOSIS — D509 Iron deficiency anemia, unspecified: Secondary | ICD-10-CM

## 2011-01-23 LAB — CBC WITH DIFFERENTIAL/PLATELET
Basophils Relative: 0.7 % (ref 0.0–3.0)
Eosinophils Absolute: 0.1 10*3/uL (ref 0.0–0.7)
Lymphocytes Relative: 25.7 % (ref 12.0–46.0)
MCHC: 34.3 g/dL (ref 30.0–36.0)
Neutrophils Relative %: 62.9 % (ref 43.0–77.0)
Platelets: 369 10*3/uL (ref 150.0–400.0)
RBC: 4.29 Mil/uL (ref 3.87–5.11)
WBC: 8.9 10*3/uL (ref 4.5–10.5)

## 2011-01-23 MED ORDER — AZITHROMYCIN 250 MG PO TABS: ORAL_TABLET | ORAL | Status: AC

## 2011-01-23 MED ORDER — GUAIFENESIN-CODEINE 100-10 MG/5ML PO SYRP: 5.0000 mL | ORAL_SOLUTION | Freq: Two times a day (BID) | ORAL | Status: DC | PRN

## 2011-01-23 NOTE — Progress Notes (Signed)
SUBJECTIVE:  Lisa Yu is a 60 y.o. female who complains of coryza, congestion, sore throat and fever for 8 days. She denies a history of chest pain, dizziness, vomiting and weakness and denies a history of asthma. Patient does not smoke cigarettes.   OBJECTIVE: BP 130/80  Pulse 72  Temp(Src) 98.7 F (37.1 C) (Oral)  Ht 5' 6.5" (1.689 m)  Wt 166 lb (75.297 kg)  BMI 26.39 kg/m2  She appears well, vital signs are as noted. Ears normal.  Throat and pharynx normal.  Neck supple. No adenopathy in the neck. Nose is congested. Sinuses non tender. +exp wheezes, faint on right.  ASSESSMENT:  sinusitis and bronchitis  PLAN: Symptomatic therapy suggested: push fluids, rest and return office visit prn if symptoms persist or worsen.  Given duration and progression of symptoms, will treat for bacterial sinusitis/bronchitis.  Call or return to clinic prn if these symptoms worsen or fail to improve as anticipated.

## 2011-01-29 ENCOUNTER — Encounter: Payer: Self-pay | Admitting: Family Medicine

## 2011-01-29 ENCOUNTER — Ambulatory Visit (INDEPENDENT_AMBULATORY_CARE_PROVIDER_SITE_OTHER): Payer: Managed Care, Other (non HMO) | Admitting: Family Medicine

## 2011-01-29 VITALS — BP 130/90 | HR 76 | Temp 98.3°F | Ht 66.5 in | Wt 173.2 lb

## 2011-01-29 DIAGNOSIS — Z136 Encounter for screening for cardiovascular disorders: Secondary | ICD-10-CM

## 2011-01-29 DIAGNOSIS — Z0000 Encounter for general adult medical examination without abnormal findings: Secondary | ICD-10-CM

## 2011-01-29 LAB — LIPID PANEL
Cholesterol: 201 mg/dL — ABNORMAL HIGH (ref 0–200)
Total CHOL/HDL Ratio: 3
Triglycerides: 67 mg/dL (ref 0.0–149.0)

## 2011-01-29 LAB — BASIC METABOLIC PANEL
CO2: 26 mEq/L (ref 19–32)
Calcium: 9 mg/dL (ref 8.4–10.5)
Chloride: 105 mEq/L (ref 96–112)
Creatinine, Ser: 0.8 mg/dL (ref 0.4–1.2)
Glucose, Bld: 95 mg/dL (ref 70–99)

## 2011-01-29 NOTE — Progress Notes (Signed)
60 yo here for CPX.  Well woman- Menopausal since 2004, has not had a hysterectomy.  Has been on Prempro  Last pap smear was normal, last year., 12/31/2009. No h/o abnormal pap smears since 2009.    Due for mammogram but having her breast implants removed in a few weeks. Will defer mammogram until after her procedure. UTD colonoscopy, tetanus, DEXA.  Seizure disorder- had one seizure on 02/27/08--sees Dr Thad Ranger, neurologist.  Sees them every three months, checks her levels and LFTs.  Currently taking 400 mg of Lamictal because was still having auras on lower dose.  Patient Active Problem List  Diagnoses  . EAR PAIN, LEFT  . DIVERTICULOSIS, COLON  . STATE, SYMPTOMATIC MENOPAUSE/FEM CLIMACTERIC  . SPONDYLOSIS, LUMBAR  . LOW BACK PAIN, ACUTE  . OSTEOPENIA  . SEIZURE DISORDER  . HEADACHE  . MAMMOGRAM, ABNORMAL  . BREAST IMPLANTS, BILATERAL, HX OF  . Pharyngitis  . Dysuria  . Iron deficiency anemia, unspecified   Past Medical History  Diagnosis Date  . Diverticulosis   . Osteopenia 12/21/2007   Past Surgical History  Procedure Date  . Vaginal delivery     x 1  . Breast surgery 1983    bilateral implants  . Cervical disease 1999    H/O Cryo  . Lasik 2007  . Colonoscopy 05/01/2005   History  Substance Use Topics  . Smoking status: Never Smoker   . Smokeless tobacco: Not on file  . Alcohol Use: Not on file   Family History  Problem Relation Age of Onset  . Cancer Mother     skin   Allergies  Allergen Reactions  . Shellfish Allergy Swelling and Rash  . Penicillins     REACTION: rash   Current Outpatient Prescriptions on File Prior to Visit  Medication Sig Dispense Refill  . aspirin 81 MG tablet Take 81 mg by mouth daily.        . Calcium Carbonate-Vitamin D (TH CALCIUM CARBONATE-VITAMIN D) 600-400 MG-UNIT per tablet Take 1 tablet by mouth daily.        Marland Kitchen estrogen, conjugated,-medroxyprogesterone (PREMPRO) 0.45-1.5 MG per tablet Take 1 tablet by mouth daily.          . LamoTRIgine (LAMICTAL XR) 100 MG TB24 Take 1 tablet by mouth 3 (three) times daily.        . Multiple Vitamin (MULTIVITAMIN) tablet Take 1 tablet by mouth daily.        Marland Kitchen azithromycin (ZITHROMAX Z-PAK) 250 MG tablet Take 2 tablets (500 mg) on  Day 1,  followed by 1 tablet (250 mg) once daily on Days 2 through 5.  6 each  0  . guaiFENesin-codeine (ROBITUSSIN AC) 100-10 MG/5ML syrup Take 5 mLs by mouth 2 (two) times daily as needed for cough.  240 mL  0    The PMH, PSH, Social History, Family History, Medications, and allergies have been reviewed in [facility], and have been updated if relevant.    Review of Systems       See HPI Patient reports no  vision/ hearing changes,anorexia, weight change, fever ,adenopathy, persistant / recurrent hoarseness, swallowing issues, chest pain, edema,persistant / recurrent cough, hemoptysis, dyspnea(rest, exertional, paroxysmal nocturnal), gastrointestinal  bleeding (melena, rectal bleeding), abdominal pain, excessive heart burn, GU symptoms(dysuria, hematuria, pyuria, voiding/incontinence  Issues) syncope, focal weakness, severe memory loss, concerning skin lesions, depression, anxiety, abnormal bruising/bleeding, major joint swelling, breast masses or abnormal vaginal bleeding.     Physical Exam BP 130/90  Pulse 76  Temp(Src)  98.3 F (36.8 C) (Oral)  Ht 5' 6.5" (1.689 m)  Wt 173 lb 4 oz (78.586 kg)  BMI 27.54 kg/m2  General:  alert, well-developed, well-nourished, and well-hydrated.   Head:  normocephalic.   Eyes:  no injection.   Ears:  R ear normal and L ear normal.   Nose:  External nasal examination shows no deformity or inflammation. Nasal mucosa are pink and moist without lesions or exudates. Mouth:  Oral mucosa and oropharynx without lesions or exudates.  Teeth in good repair. Neck:  No deformities, masses, or tenderness noted. Lungs:  normal respiratory effort, no intercostal retractions, no accessory muscle use, and normal breath sounds.    Heart:  normal rate, regular rhythm, and no murmur.   Abdomen:  soft, non-tender, normal bowel sounds, no distention, no masses, no guarding, no abdominal hernia, no inguinal hernia, no hepatomegaly, and no splenomegaly.   Msk:  no joint swelling, no joint warmth, and no redness over joints.   Extremities:  no edema either lower leg Neurologic:  alert & oriented X3, sensation intact to light touch, and gait normal.   Skin:  turgor normal, color normal, and no rashes.   Psych:  normally interactive and subdued.    Assessment and Plan:   Routine general medical examination at a health care facility   Reviewed preventive care protocols, scheduled due services, and updated immunizations Discussed nutrition, exercise, diet, and healthy lifestyle.  IFOB ordered today. Defer mammogram. FLP, BMET ordered today.

## 2011-02-09 ENCOUNTER — Telehealth: Payer: Self-pay | Admitting: *Deleted

## 2011-02-09 ENCOUNTER — Other Ambulatory Visit: Payer: Managed Care, Other (non HMO)

## 2011-02-09 MED ORDER — FLUCONAZOLE 150 MG PO TABS: 150.0000 mg | ORAL_TABLET | Freq: Once | ORAL | Status: AC

## 2011-02-09 NOTE — Telephone Encounter (Signed)
Yes [facility]

## 2011-02-09 NOTE — Telephone Encounter (Signed)
Patient advised as instructed via telephone. 

## 2011-02-09 NOTE — Telephone Encounter (Signed)
Patient was recently on antibiotic and now has a yeast infection. She is asking if something can be called in. Uses [facility].

## 2011-02-10 ENCOUNTER — Other Ambulatory Visit: Payer: Self-pay | Admitting: Family Medicine

## 2011-02-10 ENCOUNTER — Encounter: Payer: Self-pay | Admitting: *Deleted

## 2011-02-10 DIAGNOSIS — Z1211 Encounter for screening for malignant neoplasm of colon: Secondary | ICD-10-CM

## 2011-02-10 LAB — FECAL OCCULT BLOOD, IMMUNOCHEMICAL: Fecal Occult Bld: NEGATIVE

## 2011-03-02 ENCOUNTER — Ambulatory Visit (INDEPENDENT_AMBULATORY_CARE_PROVIDER_SITE_OTHER): Payer: Managed Care, Other (non HMO) | Admitting: Family Medicine

## 2011-03-02 ENCOUNTER — Encounter: Payer: Self-pay | Admitting: Family Medicine

## 2011-03-02 VITALS — BP 140/80 | HR 68 | Temp 98.5°F | Wt 174.8 lb

## 2011-03-02 DIAGNOSIS — J329 Chronic sinusitis, unspecified: Secondary | ICD-10-CM

## 2011-03-02 DIAGNOSIS — J069 Acute upper respiratory infection, unspecified: Secondary | ICD-10-CM

## 2011-03-02 MED ORDER — GUAIFENESIN-CODEINE 100-10 MG/5ML PO SOLN: 5.0000 mL | Freq: Three times a day (TID) | ORAL | Status: AC | PRN

## 2011-03-02 MED ORDER — AZITHROMYCIN 250 MG PO TABS: ORAL_TABLET | ORAL | Status: AC

## 2011-03-02 NOTE — Progress Notes (Signed)
SUBJECTIVE:  Lisa Yu is a 60 y.o. female who complains of coryza, congestion, sore throat and fever for 10 days. She denies a history of chest pain, dizziness, vomiting and weakness and denies a history of asthma. Patient does not smoke cigarettes. Given a course of Zpack in July for similar symptoms.  Felt better until after her breast implant removal surgery, same symptoms returned.   OBJECTIVE: BP 140/80  Pulse 68  Temp(Src) 98.5 F (36.9 C) (Oral)  Wt 174 lb 12 oz (79.266 kg)  She appears well, vital signs are as noted. Ears normal.  Throat and pharynx normal.  Neck supple. No adenopathy in the neck. Nose is congested. Sinuses non tender. No wheezes  ASSESSMENT:  sinusitis and bronchitis  PLAN: Symptomatic therapy suggested: push fluids, rest and return office visit prn if symptoms persist or worsen.  Given duration and progression of symptoms, will treat for bacterial sinusitis/bronchitis.  Call or return to clinic prn if these symptoms worsen or fail to improve as anticipated.

## 2011-03-25 ENCOUNTER — Other Ambulatory Visit: Payer: Self-pay | Admitting: *Deleted

## 2011-03-25 MED ORDER — CONJ ESTROG-MEDROXYPROGEST ACE 0.45-1.5 MG PO TABS: 1.0000 | ORAL_TABLET | Freq: Every day | ORAL | Status: DC

## 2011-03-25 NOTE — Telephone Encounter (Signed)
rx sent to pharmacy by e-script  

## 2011-04-19 ENCOUNTER — Other Ambulatory Visit: Payer: Self-pay | Admitting: Family Medicine

## 2011-07-06 ENCOUNTER — Encounter: Payer: Self-pay | Admitting: Family Medicine

## 2011-07-06 ENCOUNTER — Ambulatory Visit (INDEPENDENT_AMBULATORY_CARE_PROVIDER_SITE_OTHER): Payer: Managed Care, Other (non HMO) | Admitting: Family Medicine

## 2011-07-06 VITALS — BP 130/80 | HR 93 | Temp 98.4°F | Ht 66.5 in | Wt 171.8 lb

## 2011-07-06 DIAGNOSIS — J40 Bronchitis, not specified as acute or chronic: Secondary | ICD-10-CM

## 2011-07-06 DIAGNOSIS — J069 Acute upper respiratory infection, unspecified: Secondary | ICD-10-CM

## 2011-07-06 DIAGNOSIS — J019 Acute sinusitis, unspecified: Secondary | ICD-10-CM | POA: Insufficient documentation

## 2011-07-06 MED ORDER — AZITHROMYCIN 250 MG PO TABS: ORAL_TABLET | ORAL | Status: DC

## 2011-07-06 NOTE — Patient Instructions (Signed)
Take antibiotic as directed.  Drink lots of fluids.    Treat sympotmatically with Mucinex, nasal saline irrigation, and Tylenol/Ibuprofen.   You can use warm compresses.  Cough suppressant at night.   Call if not improving as expected in 5-7 days.    

## 2011-07-06 NOTE — Progress Notes (Signed)
SUBJECTIVE:  Lisa Yu is a 60 y.o. female who complains of coryza, congestion, dry cough, myalgias and both sinus pain for 14 days. She denies a history of anorexia, chest pain, sweats and vomiting and denies a history of asthma. Patient denies smoke cigarettes.   OBJECTIVE: BP 130/80  Pulse 93  Temp(Src) 98.4 F (36.9 C) (Oral)  Ht 5' 6.5" (1.689 m)  Wt 171 lb 12 oz (77.905 kg)  BMI 27.31 kg/m2  She appears well, vital signs are as noted. Ears normal.  Throat and pharynx normal.  Neck supple. No adenopathy in the neck. Nose is congested. Sinuses TTP throughout. Scattered exp wheezes, no rhonchi. Patient Active Problem List  Diagnoses  . EAR PAIN, LEFT  . DIVERTICULOSIS, COLON  . STATE, SYMPTOMATIC MENOPAUSE/FEM CLIMACTERIC  . SPONDYLOSIS, LUMBAR  . LOW BACK PAIN, ACUTE  . OSTEOPENIA  . SEIZURE DISORDER  . HEADACHE  . MAMMOGRAM, ABNORMAL  . BREAST IMPLANTS, BILATERAL, HX OF  . Pharyngitis  . Dysuria  . Iron deficiency anemia, unspecified  . URI (upper respiratory infection)   Past Medical History  Diagnosis Date  . Diverticulosis   . Osteopenia 12/21/2007   Past Surgical History  Procedure Date  . Vaginal delivery     x 1  . Breast surgery 1983    bilateral implants  . Cervical disease 1999    H/O Cryo  . Lasik 2007  . Colonoscopy 05/01/2005   History  Substance Use Topics  . Smoking status: Never Smoker   . Smokeless tobacco: Not on file  . Alcohol Use: Not on file   Family History  Problem Relation Age of Onset  . Cancer Mother     skin   Allergies  Allergen Reactions  . Shellfish Allergy Swelling and Rash  . Penicillins     REACTION: rash   Current Outpatient Prescriptions on File Prior to Visit  Medication Sig Dispense Refill  . aspirin 81 MG tablet Take 81 mg by mouth daily.        . Calcium Carbonate-Vitamin D (TH CALCIUM CARBONATE-VITAMIN D) 600-400 MG-UNIT per tablet Take 1 tablet by mouth daily.        . LamoTRIgine (LAMICTAL XR) 100  MG TB24 Take 1 tablet by mouth 3 (three) times daily.        . Multiple Vitamin (MULTIVITAMIN) tablet Take 1 tablet by mouth daily.        Marland Kitchen PREMPRO 0.45-1.5 MG per tablet TAKE 1 TABLET BY MOUTH DAILY.  28 tablet  11   The PMH, PSH, Social History, Family History, Medications, and allergies have been reviewed in [facility], and have been updated if relevant.   ASSESSMENT:  sinusitis and bronchitis  PLAN: Given duration and progression of symptoms, will treat for bacterial sinusitis. Symptomatic therapy suggested: push fluids, rest and return office visit prn if symptoms persist or worsen. Call or return to clinic prn if these symptoms worsen or fail to improve as anticipated.

## 2012-01-06 ENCOUNTER — Ambulatory Visit (INDEPENDENT_AMBULATORY_CARE_PROVIDER_SITE_OTHER): Payer: Managed Care, Other (non HMO) | Admitting: Family Medicine

## 2012-01-06 ENCOUNTER — Encounter: Payer: Self-pay | Admitting: Family Medicine

## 2012-01-06 VITALS — BP 140/90 | HR 76 | Temp 98.2°F | Wt 171.0 lb

## 2012-01-06 DIAGNOSIS — J069 Acute upper respiratory infection, unspecified: Secondary | ICD-10-CM

## 2012-01-06 MED ORDER — HYDROCOD POLST-CHLORPHEN POLST 10-8 MG/5ML PO LQCR: 5.0000 mL | Freq: Every evening | ORAL | Status: DC | PRN

## 2012-01-06 MED ORDER — FLUTICASONE PROPIONATE 50 MCG/ACT NA SUSP: 2.0000 | Freq: Every day | NASAL | Status: DC

## 2012-01-06 MED ORDER — AZITHROMYCIN 250 MG PO TABS: ORAL_TABLET | ORAL | Status: AC

## 2012-01-06 NOTE — Progress Notes (Signed)
SUBJECTIVE:  Lisa Yu is a 61 y.o. female who complains of coryza, congestion, sneezing, sore throat, swollen glands and bilateral sinus pain for 8 days. She denies a history of anorexia, chest pain, chills, dizziness, fatigue and fevers and denies a history of asthma. Patient denies smoke cigarettes.   Patient Active Problem List  Diagnosis  . EAR PAIN, LEFT  . DIVERTICULOSIS, COLON  . STATE, SYMPTOMATIC MENOPAUSE/FEM CLIMACTERIC  . SPONDYLOSIS, LUMBAR  . LOW BACK PAIN, ACUTE  . OSTEOPENIA  . SEIZURE DISORDER  . HEADACHE  . MAMMOGRAM, ABNORMAL  . BREAST IMPLANTS, BILATERAL, HX OF  . Pharyngitis  . Dysuria  . Iron deficiency anemia, unspecified  . URI (upper respiratory infection)   Past Medical History  Diagnosis Date  . Diverticulosis   . Osteopenia 12/21/2007   Past Surgical History  Procedure Date  . Vaginal delivery     x 1  . Breast surgery 1983    bilateral implants  . Cervical disease 1999    H/O Cryo  . Lasik 2007  . Colonoscopy 05/01/2005   History  Substance Use Topics  . Smoking status: Never Smoker   . Smokeless tobacco: Not on file  . Alcohol Use: Not on file   Family History  Problem Relation Age of Onset  . Cancer Mother     skin   Allergies  Allergen Reactions  . Shellfish Allergy Swelling and Rash  . Penicillins     REACTION: rash   Current Outpatient Prescriptions on File Prior to Visit  Medication Sig Dispense Refill  . aspirin 81 MG tablet Take 81 mg by mouth daily.        . Calcium Carbonate-Vitamin D (TH CALCIUM CARBONATE-VITAMIN D) 600-400 MG-UNIT per tablet Take 1 tablet by mouth daily.        . LamoTRIgine (LAMICTAL XR) 100 MG TB24 Take 1 tablet by mouth 3 (three) times daily.        . Multiple Vitamin (MULTIVITAMIN) tablet Take 1 tablet by mouth daily.        Marland Kitchen PREMPRO 0.45-1.5 MG per tablet TAKE 1 TABLET BY MOUTH DAILY.  28 tablet  11   The PMH, PSH, Social History, Family History, Medications, and allergies have been  reviewed in [facility], and have been updated if relevant.  OBJECTIVE: BP 140/90  Pulse 76  Temp 98.2 F (36.8 C)  Wt 171 lb (77.565 kg)  She appears well, vital signs are as noted. Ears normal.  Throat and pharynx normal.  Neck supple. No adenopathy in the neck. Nose is congested. Sinuses non tender. The chest is clear, without wheezes or rales.  ASSESSMENT:  .sinusitis  PLAN: Zpack (PCN allergic), Flonase. Symptomatic therapy suggested: push fluids, rest and return office visit prn if symptoms persist or worsen.  Call or return to clinic prn if these symptoms worsen or fail to improve as anticipated.

## 2012-01-06 NOTE — Patient Instructions (Addendum)
Take  Zpack as directed.  Drink lots of fluids.  We are starting flonse. Treat sympotmatically with Mucinex, nasal saline irrigation, and Tylenol/Ibuprofen. Cough suppressant at night. Call if not improving as expected in 5-7 days.

## 2012-04-28 ENCOUNTER — Other Ambulatory Visit: Payer: Self-pay | Admitting: Family Medicine

## 2012-05-09 ENCOUNTER — Ambulatory Visit (INDEPENDENT_AMBULATORY_CARE_PROVIDER_SITE_OTHER): Payer: Managed Care, Other (non HMO) | Admitting: Family Medicine

## 2012-05-09 ENCOUNTER — Telehealth: Payer: Self-pay | Admitting: *Deleted

## 2012-05-09 ENCOUNTER — Ambulatory Visit: Payer: Self-pay | Admitting: Family Medicine

## 2012-05-09 ENCOUNTER — Telehealth: Payer: Self-pay | Admitting: Family Medicine

## 2012-05-09 ENCOUNTER — Telehealth: Payer: Self-pay

## 2012-05-09 ENCOUNTER — Encounter: Payer: Self-pay | Admitting: Family Medicine

## 2012-05-09 VITALS — BP 150/100 | HR 76 | Temp 98.2°F | Wt 172.0 lb

## 2012-05-09 DIAGNOSIS — M79604 Pain in right leg: Secondary | ICD-10-CM | POA: Insufficient documentation

## 2012-05-09 DIAGNOSIS — M79609 Pain in unspecified limb: Secondary | ICD-10-CM

## 2012-05-09 NOTE — Addendum Note (Signed)
Addended by: Dianne Dun on: 05/09/2012 10:14 AM   Modules accepted: Orders

## 2012-05-09 NOTE — Progress Notes (Signed)
  Subjective:    Patient ID: Lisa Yu, female    DOB: 1951-05-01, 61 y.o.   MRN: [id number]  HPI 61 yo female here for ? Blood clot in her leg.  Noticed pain in her right calf approximately 1 week and a couple of days ago, noticed a painful, palpable mass.  No recent travel. No h/o smoking. No h/o malignancy or blood clots. No CP or SOB.  Risk factors include Prempro daily.  Patient Active Problem List  Diagnosis  . EAR PAIN, LEFT  . DIVERTICULOSIS, COLON  . STATE, SYMPTOMATIC MENOPAUSE/FEM CLIMACTERIC  . SPONDYLOSIS, LUMBAR  . LOW BACK PAIN, ACUTE  . OSTEOPENIA  . SEIZURE DISORDER  . HEADACHE  . MAMMOGRAM, ABNORMAL  . BREAST IMPLANTS, BILATERAL, HX OF  . Pharyngitis  . Dysuria  . Iron deficiency anemia, unspecified  . URI (upper respiratory infection)   Past Medical History  Diagnosis Date  . Diverticulosis   . Osteopenia 12/21/2007   Past Surgical History  Procedure Date  . Vaginal delivery     x 1  . Breast surgery 1983    bilateral implants  . Cervical disease 1999    H/O Cryo  . Lasik 2007  . Colonoscopy 05/01/2005   History  Substance Use Topics  . Smoking status: Never Smoker   . Smokeless tobacco: Not on file  . Alcohol Use: Not on file   Family History  Problem Relation Age of Onset  . Cancer Mother     skin   Allergies  Allergen Reactions  . Shellfish Allergy Swelling and Rash  . Penicillins     REACTION: rash   Current Outpatient Prescriptions on File Prior to Visit  Medication Sig Dispense Refill  . aspirin 81 MG tablet Take 81 mg by mouth daily.        . Calcium Carbonate-Vitamin D (TH CALCIUM CARBONATE-VITAMIN D) 600-400 MG-UNIT per tablet Take 1 tablet by mouth daily.        . fluticasone (FLONASE) 50 MCG/ACT nasal spray Place 2 sprays into the nose daily.  16 g  6  . LamoTRIgine (LAMICTAL XR) 100 MG TB24 Take 1 tablet by mouth 3 (three) times daily.        . Multiple Vitamin (MULTIVITAMIN) tablet Take 1 tablet by mouth  daily.        Marland Kitchen PREMPRO 0.45-1.5 MG per tablet TAKE 1 TABLET BY MOUTH DAILY.  28 tablet  0   The PMH, PSH, Social History, Family History, Medications, and allergies have been reviewed in [facility], and have been updated if relevant.   Review of Systems     Objective:   Physical Exam BP 150/100  Pulse 76  Temp 98.2 F (36.8 C)  Wt 172 lb (78.019 kg) Gen:  Alert pleasant, NAD Resp:  CTA bilaterally CVS:  RRR Ext: Right leg: Small palpable nodule RLL, TTP, no erythema or warmth. No edema     Assessment & Plan:   1. Right leg pain  Lower Extremity Venous Reflux Right, Lower Extremity Venous Reflux Right   New-  Refer to venous doppler of RLE to rule out DVT. Continue ASA. The patient indicates understanding of these issues and agrees with the plan.

## 2012-05-09 NOTE — Patient Instructions (Addendum)
Good to see you. Please stop by to see [name] on your way out to set up your ultrasound. I will call you as soon as I have the results.

## 2012-05-09 NOTE — Telephone Encounter (Signed)
Dr Dayton Martes said to tell pt doppler was negative for DVT, so pt does not have blood clot. Megan at [facility] will advise pt.

## 2012-05-09 NOTE — Telephone Encounter (Signed)
Opened in error

## 2012-05-09 NOTE — Telephone Encounter (Signed)
Caller: Laquitha/Patient; Patient Name: Lisa Yu; PCP: ; Janith Lima Phone Number: [phone]; Reason for call: clots.  Pt reports her leg has been hurting for "a while" (a couple of months).  Pain is on her rt calf.  Pt denies any redness or swelling on the leg and pt is afebrile. Pt does report a  pea size numb feeling on calf.  Pt rates her pain as 5/10.  Pt reports she has an appt scheduled for 2:30. Traiged patient per Leg Non-Injury.  See Provider within 24 hours Disposition for 'New onset mild to moderate pain that has not improved with 24 hours of home care'.  Home care advice and call back parameters given.  RN instructed pt to keep appt.

## 2012-05-09 NOTE — Telephone Encounter (Signed)
Megan with [facility] called preliminary report rt leg doppler; negative for DVT and soft tissue was negative. Pt waiting.

## 2012-05-09 NOTE — Telephone Encounter (Signed)
Pt will come in for appt now, per Dr.Aron's request.

## 2012-05-10 ENCOUNTER — Encounter: Payer: Self-pay | Admitting: Family Medicine

## 2012-05-17 ENCOUNTER — Other Ambulatory Visit (HOSPITAL_COMMUNITY)
Admission: RE | Admit: 2012-05-17 | Discharge: 2012-05-17 | Disposition: A | Discharge status: Home / Self Care | Payer: Managed Care, Other (non HMO) | Source: Ambulatory Visit | Attending: Family Medicine | Admitting: Family Medicine

## 2012-05-17 ENCOUNTER — Encounter: Payer: Self-pay | Admitting: Family Medicine

## 2012-05-17 ENCOUNTER — Ambulatory Visit (INDEPENDENT_AMBULATORY_CARE_PROVIDER_SITE_OTHER): Payer: Managed Care, Other (non HMO) | Admitting: Family Medicine

## 2012-05-17 VITALS — BP 140/80 | HR 68 | Temp 97.9°F | Ht 66.0 in | Wt 171.0 lb

## 2012-05-17 DIAGNOSIS — M949 Disorder of cartilage, unspecified: Secondary | ICD-10-CM

## 2012-05-17 DIAGNOSIS — Z1231 Encounter for screening mammogram for malignant neoplasm of breast: Secondary | ICD-10-CM

## 2012-05-17 DIAGNOSIS — Z0000 Encounter for general adult medical examination without abnormal findings: Secondary | ICD-10-CM

## 2012-05-17 DIAGNOSIS — Z1151 Encounter for screening for human papillomavirus (HPV): Secondary | ICD-10-CM | POA: Insufficient documentation

## 2012-05-17 DIAGNOSIS — Z136 Encounter for screening for cardiovascular disorders: Secondary | ICD-10-CM

## 2012-05-17 DIAGNOSIS — R569 Unspecified convulsions: Secondary | ICD-10-CM

## 2012-05-17 DIAGNOSIS — Z23 Encounter for immunization: Secondary | ICD-10-CM

## 2012-05-17 DIAGNOSIS — Z1211 Encounter for screening for malignant neoplasm of colon: Secondary | ICD-10-CM

## 2012-05-17 DIAGNOSIS — D509 Iron deficiency anemia, unspecified: Secondary | ICD-10-CM

## 2012-05-17 DIAGNOSIS — M899 Disorder of bone, unspecified: Secondary | ICD-10-CM

## 2012-05-17 DIAGNOSIS — Z01419 Encounter for gynecological examination (general) (routine) without abnormal findings: Secondary | ICD-10-CM | POA: Insufficient documentation

## 2012-05-17 LAB — LIPID PANEL
HDL: 52.6 mg/dL (ref >39.00)
Total CHOL/HDL Ratio: 3
Triglycerides: 71 mg/dL (ref 0.0–149.0)
VLDL: 14.2 mg/dL (ref 0.0–40.0)

## 2012-05-17 LAB — CBC WITH DIFFERENTIAL/PLATELET
Basophils Relative: 0.4 % (ref 0.0–3.0)
Eosinophils Relative: 0.6 % (ref 0.0–5.0)
Hemoglobin: 13.7 g/dL (ref 12.0–15.0)
Lymphocytes Relative: 41.3 % (ref 12.0–46.0)
MCHC: 32.5 g/dL (ref 30.0–36.0)
Monocytes Relative: 7.6 % (ref 3.0–12.0)
Neutro Abs: 2.5 10*3/uL (ref 1.4–7.7)
Neutrophils Relative %: 50.1 % (ref 43.0–77.0)
RBC: 4.58 Mil/uL (ref 3.87–5.11)
WBC: 5 10*3/uL (ref 4.5–10.5)

## 2012-05-17 LAB — COMPREHENSIVE METABOLIC PANEL
ALT: 35 U/L (ref 0–35)
AST: 31 U/L (ref 0–37)
BUN: 16 mg/dL (ref 6–23)
Calcium: 9.1 mg/dL (ref 8.4–10.5)
Chloride: 102 mEq/L (ref 96–112)
Creatinine, Ser: 0.7 mg/dL (ref 0.4–1.2)
Total Bilirubin: 0.4 mg/dL (ref 0.3–1.2)

## 2012-05-17 NOTE — Patient Instructions (Addendum)
Good to see you, Lisa Yu. Please stop by to see [name] after you go to the lab. We will call you with your lab results.

## 2012-05-17 NOTE — Progress Notes (Signed)
61 yo here for CPX.  Well woman- Menopausal since 2004, has not had a hysterectomy.  Has been on Prempro  Last pap smear was normal, last year., 12/31/2009. No h/o abnormal pap smears since 2009.    Due for mammogram.    UTD colonoscopy, tetanus, DEXA.  Seizure disorder- had one seizure on 02/27/08--sees Dr Thad Ranger, neurologist.  Sees them every three months, checks her levels and LFTs.  Currently taking 400 mg of Lamictal because was still having auras on lower dose.  Patient Active Problem List  Diagnosis  . EAR PAIN, LEFT  . DIVERTICULOSIS, COLON  . STATE, SYMPTOMATIC MENOPAUSE/FEM CLIMACTERIC  . SPONDYLOSIS, LUMBAR  . LOW BACK PAIN, ACUTE  . OSTEOPENIA  . SEIZURE DISORDER  . HEADACHE  . MAMMOGRAM, ABNORMAL  . BREAST IMPLANTS, BILATERAL, HX OF  . Pharyngitis  . Dysuria  . Iron deficiency anemia, unspecified  . URI (upper respiratory infection)  . Right leg pain  . Routine general medical examination at a health care facility   Past Medical History  Diagnosis Date  . Diverticulosis   . Osteopenia 12/21/2007   Past Surgical History  Procedure Date  . Vaginal delivery     x 1  . Breast surgery 1983    bilateral implants  . Cervical disease 1999    H/O Cryo  . Lasik 2007  . Colonoscopy 05/01/2005   History  Substance Use Topics  . Smoking status: Never Smoker   . Smokeless tobacco: Not on file  . Alcohol Use: Not on file   Family History  Problem Relation Age of Onset  . Cancer Mother     skin   Allergies  Allergen Reactions  . Shellfish Allergy Swelling and Rash  . Penicillins     REACTION: rash   Current Outpatient Prescriptions on File Prior to Visit  Medication Sig Dispense Refill  . aspirin 81 MG tablet Take 81 mg by mouth daily.        . Calcium Carbonate-Vitamin D (TH CALCIUM CARBONATE-VITAMIN D) 600-400 MG-UNIT per tablet Take 1 tablet by mouth daily.        . fluticasone (FLONASE) 50 MCG/ACT nasal spray Place 2 sprays into the nose daily.  16 g   6  . LamoTRIgine (LAMICTAL XR) 100 MG TB24 Take 1 tablet by mouth 3 (three) times daily.        . Multiple Vitamin (MULTIVITAMIN) tablet Take 1 tablet by mouth daily.        Marland Kitchen PREMPRO 0.45-1.5 MG per tablet TAKE 1 TABLET BY MOUTH DAILY.  28 tablet  0    The PMH, PSH, Social History, Family History, Medications, and allergies have been reviewed in [facility], and have been updated if relevant.    Review of Systems       See HPI Patient reports no  vision/ hearing changes,anorexia, weight change, fever ,adenopathy, persistant / recurrent hoarseness, swallowing issues, chest pain, edema,persistant / recurrent cough, hemoptysis, dyspnea(rest, exertional, paroxysmal nocturnal), gastrointestinal  bleeding (melena, rectal bleeding), abdominal pain, excessive heart burn, GU symptoms(dysuria, hematuria, pyuria, voiding/incontinence  Issues) syncope, focal weakness, severe memory loss, concerning skin lesions, depression, anxiety, abnormal bruising/bleeding, major joint swelling, breast masses or abnormal vaginal bleeding.     Physical Exam BP 140/80  Pulse 68  Temp 97.9 F (36.6 C)  Ht 5\' 6"  (1.676 m)  Wt 171 lb (77.565 kg)  BMI 27.60 kg/m2  General:  Well-developed,well-nourished,in no acute distress; alert,appropriate and cooperative throughout examination Head:  normocephalic and atraumatic.  Eyes:  vision grossly intact, pupils equal, pupils round, and pupils reactive to light.   Ears:  R ear normal and L ear normal.   Nose:  no external deformity.   Mouth:  good dentition.   Neck:  No deformities, masses, or tenderness noted. Breasts:  No mass, nodules, thickening, tenderness, bulging, retraction, inflamation, nipple discharge or skin changes noted.   Lungs:  Normal respiratory effort, chest expands symmetrically. Lungs are clear to auscultation, no crackles or wheezes. Heart:  Normal rate and regular rhythm. S1 and S2 normal without gallop, murmur, click, rub or other extra sounds. Abdomen:   Bowel sounds positive,abdomen soft and non-tender without masses, organomegaly or hernias noted. Rectal:  no external abnormalities.   Genitalia:  Pelvic Exam:        External: normal female genitalia without lesions or masses        Vagina: normal without lesions or masses        Cervix: normal without lesions or masses        Adnexa: normal bimanual exam without masses or fullness        Uterus: normal by palpation        Pap smear: performed Msk:  No deformity or scoliosis noted of thoracic or lumbar spine.   Extremities:  No clubbing, cyanosis, edema, or deformity noted with normal full range of motion of all joints.   Neurologic:  alert & oriented X3 and gait normal.   Skin:  Intact without suspicious lesions or rashes Cervical Nodes:  No lymphadenopathy noted Axillary Nodes:  No palpable lymphadenopathy Psych:  Cognition and judgment appear intact. Alert and cooperative with normal attention span and concentration. No apparent delusions, illusions, hallucinations   Assessment and Plan: 1. Routine general medical examination at a health care facility  Reviewed preventive care protocols, scheduled due services, and updated immunizations Discussed nutrition, exercise, diet, and healthy lifestyle.   Comprehensive metabolic panel Pap Smear IFOB  3. Iron deficiency anemia, unspecified  CBC with Differential  4. Other screening mammogram  MM Digital Screening  5. Screening for ischemic heart disease  Lipid Panel  7. Screening for colon cancer  Fecal occult blood, imunochemical

## 2012-05-20 ENCOUNTER — Encounter: Payer: Self-pay | Admitting: *Deleted

## 2012-05-20 ENCOUNTER — Encounter: Payer: Self-pay | Admitting: Family Medicine

## 2012-05-24 ENCOUNTER — Telehealth: Payer: Self-pay

## 2012-05-24 NOTE — Telephone Encounter (Signed)
Pt got stool kit when seen 05/17/12;pt noticed her name is not on bag;[place] name on specimen bag. Rodney Booze advised pt to discard that kit and she will mail pt a new stool kit. Pt voiced understanding and will wait to receive new kit in mail.

## 2012-05-27 ENCOUNTER — Ambulatory Visit: Payer: Self-pay | Admitting: Family Medicine

## 2012-05-30 ENCOUNTER — Encounter: Payer: Self-pay | Admitting: Family Medicine

## 2012-05-31 ENCOUNTER — Encounter: Payer: Self-pay | Admitting: *Deleted

## 2012-05-31 ENCOUNTER — Encounter: Payer: Self-pay | Admitting: Family Medicine

## 2012-05-31 LAB — HM MAMMOGRAPHY: HM Mammogram: NORMAL

## 2012-06-07 ENCOUNTER — Telehealth: Payer: Self-pay

## 2012-06-07 NOTE — Telephone Encounter (Signed)
Advised patient, appt made with Dr. Copland. 

## 2012-06-07 NOTE — Telephone Encounter (Signed)
Pt request MRI of right knee; back of rt knee hurting all the time; pain level 8. Please advise.

## 2012-06-07 NOTE — Telephone Encounter (Signed)
She would need knee xray first.  I would recommend coming in for xray and to see Dr. Patsy Lager.

## 2012-06-08 ENCOUNTER — Ambulatory Visit (INDEPENDENT_AMBULATORY_CARE_PROVIDER_SITE_OTHER)
Admission: RE | Admit: 2012-06-08 | Discharge: 2012-06-08 | Disposition: A | Discharge status: Home / Self Care | Payer: Managed Care, Other (non HMO) | Source: Ambulatory Visit | Attending: Family Medicine | Admitting: Family Medicine

## 2012-06-08 ENCOUNTER — Ambulatory Visit (INDEPENDENT_AMBULATORY_CARE_PROVIDER_SITE_OTHER): Payer: Managed Care, Other (non HMO) | Admitting: Family Medicine

## 2012-06-08 ENCOUNTER — Encounter: Payer: Self-pay | Admitting: Family Medicine

## 2012-06-08 VITALS — BP 120/84 | HR 68 | Temp 97.7°F | Ht 66.0 in | Wt 171.5 lb

## 2012-06-08 DIAGNOSIS — M7120 Synovial cyst of popliteal space [Baker], unspecified knee: Secondary | ICD-10-CM

## 2012-06-08 DIAGNOSIS — M25569 Pain in unspecified knee: Secondary | ICD-10-CM

## 2012-06-08 MED ORDER — ETODOLAC 400 MG PO TABS: 400.0000 mg | ORAL_TABLET | Freq: Two times a day (BID) | ORAL | Status: DC

## 2012-06-08 NOTE — Progress Notes (Signed)
Nature conservation officer at [facility] [address] [place] [address] Phone: [phone] Fax: [phone]  Date:  06/08/2012   Name:  Lisa Yu   DOB:  1950-11-23   MRN:  [id number] Gender: female Age: 61 y.o.  PCP:  Ruthe Mannan, MD  Evaluating MD: Hannah Beat, MD   Chief Complaint: Knee Pain   History of Present Illness:  Lisa Yu is a 61 y.o. pleasant patient who presents with the following:  Came in the middle of October, felt a little knot in the back of her leg, and then it was feeling ok. Will never really quit -- even with taking. No injury.   Patient presents with 1 mo h/o R sided knee pain after no known injury. No audible pop was heard. The patient has not had an effusion. No symptomatic giving-way. No mechanical clicking. Joint has not locked up. Patient has been able to walk but is limping some. The patient does have pain going up and down stairs or rising from a seated position.   Pain location: lateral and posterior Current physical activity: none Prior Knee Surgery: none Current pain meds: none Bracing: none   Patient Active Problem List  Diagnosis  . EAR PAIN, LEFT  . DIVERTICULOSIS, COLON  . STATE, SYMPTOMATIC MENOPAUSE/FEM CLIMACTERIC  . SPONDYLOSIS, LUMBAR  . LOW BACK PAIN, ACUTE  . OSTEOPENIA  . SEIZURE DISORDER  . HEADACHE  . MAMMOGRAM, ABNORMAL  . BREAST IMPLANTS, BILATERAL, HX OF  . Pharyngitis  . Dysuria  . Iron deficiency anemia, unspecified  . URI (upper respiratory infection)  . Right leg pain  . Routine general medical examination at a health care facility    Past Medical History  Diagnosis Date  . Diverticulosis   . Osteopenia 12/21/2007    Past Surgical History  Procedure Date  . Vaginal delivery     x 1  . Breast surgery 1983    bilateral implants  . Cervical disease 1999    H/O Cryo  . Lasik 2007  . Colonoscopy 05/01/2005    History  Substance Use Topics  . Smoking status: Never Smoker     . Smokeless tobacco: Not on file  . Alcohol Use: Not on file    Family History  Problem Relation Age of Onset  . Cancer Mother     skin    Allergies  Allergen Reactions  . Shellfish Allergy Swelling and Rash  . Penicillins     REACTION: rash    Medication list has been reviewed and updated.  Outpatient Prescriptions Prior to Visit  Medication Sig Dispense Refill  . aspirin 81 MG tablet Take 81 mg by mouth daily.        . Calcium Carbonate-Vitamin D (TH CALCIUM CARBONATE-VITAMIN D) 600-400 MG-UNIT per tablet Take 1 tablet by mouth daily.        . fluticasone (FLONASE) 50 MCG/ACT nasal spray Place 2 sprays into the nose daily.  16 g  6  . LamoTRIgine (LAMICTAL XR) 100 MG TB24 Take 1 tablet by mouth 3 (three) times daily.        . Multiple Vitamin (MULTIVITAMIN) tablet Take 1 tablet by mouth daily.        Marland Kitchen PREMPRO 0.45-1.5 MG per tablet TAKE 1 TABLET BY MOUTH DAILY.  28 tablet  0   Last reviewed on 06/08/2012 11:14 AM by Consuello Masse, CMA  Review of Systems:   GEN: No fevers, chills. Nontoxic. Primarily MSK c/o today. MSK:  Detailed in the HPI GI: tolerating PO intake without difficulty Neuro: No numbness, parasthesias, or tingling associated. Otherwise the pertinent positives of the ROS are noted above.    Physical Examination: Filed Vitals:   06/08/12 1112  BP: 120/84  Pulse: 68  Temp: 97.7 F (36.5 C)  TempSrc: Oral  Height: 5\' 6"  (1.676 m)  Weight: 171 lb 8 oz (77.792 kg)  SpO2: 98%    Body mass index is 27.68 kg/(m^2). Ideal Body Weight: Weight in (lb) to have BMI = 25: 154.6    GEN: WDWN, NAD, Non-toxic, Alert & Oriented x 3 HEENT: Atraumatic, Normocephalic.  Ears and Nose: No external deformity. EXTR: No clubbing/cyanosis/edema NEURO: Normal gait.  PSYCH: Normally interactive. Conversant. Not depressed or anxious appearing.  Calm demeanor.   Knee:  R Gait: Normal heel toe pattern ROM: 0-125 Effusion: neg Echymosis or edema: none Patellar  tendon NT Painful PLICA: neg Patellar grind: negative Medial and lateral patellar facet loading: negative medial and lateral joint lines: lateral joint line TTP Mcmurray's some pain Flexion-pinch pain Varus and valgus stress: stable Lachman: neg Ant and Post drawer: neg Hip abduction, IR, ER: WNL Hip flexion str: 5/5 Hip abd: 5/5 Quad: 5/5 VMO atrophy: mild-mod on the R Hamstring concentric and eccentric: 5/5   Objective:  Dg Knee Ap/lat W/sunrise Right  06/08/2012  *RADIOLOGY REPORT*  Clinical Data: Right knee pain  DG KNEE - 3 VIEWS  Comparison: None  Findings: Osseous mineralization question mildly decreased. Bone island medial tibial plateau. No acute fracture, dislocation or bone destruction. No definite knee joint effusion or regional soft tissue abnormality.  IMPRESSION: No acute abnormalities.   Original Report Authenticated By: Ulyses Southward, M.D.     Assessment and Plan:  1. Knee pain  DG Knee AP/LAT W/Sunrise Right  2. Baker's cyst     Joint space preserved Concern for degenerative meniscal tear with baker's cyst  NSAIDS [facility] knee rehab reviewed  Recheck 5 weeks  Knee Injection, R Patient verbally consented to procedure. Risks (including potential rare risk of infection), benefits, and alternatives explained. Sterilely prepped with Chloraprep. Ethyl cholride used for anesthesia. 8 cc Lidocaine 1% mixed with 2 cc of Depo-Medrol 40 mg injected using the anterolateral approach without difficulty. No complications with procedure and tolerated well. Patient had decreased pain post-injection.   Orders Today:  Orders Placed This Encounter  Procedures  . DG Knee AP/LAT W/Sunrise Right    Standing Status: Future     Number of Occurrences: 1     Standing Expiration Date: 08/08/2013    Order Specific Question:  Reason for exam:    Answer:  knee pain    Order Specific Question:  Preferred imaging location?    Answer:  [facility]    Updated Medication List:  (Includes new medications, updates to list, dose adjustments) Meds ordered this encounter  Medications  . etodolac (LODINE) 400 MG tablet    Sig: Take 1 tablet (400 mg total) by mouth 2 (two) times daily.    Dispense:  60 tablet    Refill:  3    Medications Discontinued: There are no discontinued medications.   Hannah Beat, MD

## 2012-06-08 NOTE — Patient Instructions (Addendum)
Recheck in 5-6 weeks 

## 2012-06-18 ENCOUNTER — Other Ambulatory Visit: Payer: Self-pay | Admitting: Family Medicine

## 2012-07-13 ENCOUNTER — Ambulatory Visit (INDEPENDENT_AMBULATORY_CARE_PROVIDER_SITE_OTHER): Payer: Managed Care, Other (non HMO) | Admitting: Family Medicine

## 2012-07-13 ENCOUNTER — Encounter: Payer: Self-pay | Admitting: Family Medicine

## 2012-07-13 VITALS — BP 120/82 | HR 85 | Temp 98.2°F | Ht 66.0 in | Wt 172.8 lb

## 2012-07-13 DIAGNOSIS — M25561 Pain in right knee: Secondary | ICD-10-CM

## 2012-07-13 DIAGNOSIS — M25569 Pain in unspecified knee: Secondary | ICD-10-CM

## 2012-07-13 NOTE — Progress Notes (Signed)
Nature conservation officer at [facility] [address] [place] [address] Phone: [phone] Fax: [phone]  Date:  07/13/2012   Name:  Lisa Yu   DOB:  November 22, 1950   MRN:  [id number] Gender: female Age: 61 y.o.  PCP:  Ruthe Mannan, MD  Evaluating MD: Hannah Beat, MD   Chief Complaint: Follow-up   History of Present Illness:  Lisa Yu is a 61 y.o. pleasant patient who presents with the following:  F/u R knee pain from one month ago, felt she had a baker's cyst and could not rule out meniscal pathology. S/p intraarticular injection, feeling better now.   Patient Active Problem List  Diagnosis  . EAR PAIN, LEFT  . DIVERTICULOSIS, COLON  . STATE, SYMPTOMATIC MENOPAUSE/FEM CLIMACTERIC  . SPONDYLOSIS, LUMBAR  . LOW BACK PAIN, ACUTE  . OSTEOPENIA  . SEIZURE DISORDER  . HEADACHE  . MAMMOGRAM, ABNORMAL  . BREAST IMPLANTS, BILATERAL, HX OF  . Pharyngitis  . Dysuria  . Iron deficiency anemia, unspecified  . URI (upper respiratory infection)  . Right leg pain  . Routine general medical examination at a health care facility    Past Medical History  Diagnosis Date  . Diverticulosis   . Osteopenia 12/21/2007    Past Surgical History  Procedure Date  . Vaginal delivery     x 1  . Breast surgery 1983    bilateral implants  . Cervical disease 1999    H/O Cryo  . Lasik 2007  . Colonoscopy 05/01/2005    History  Substance Use Topics  . Smoking status: Never Smoker   . Smokeless tobacco: Not on file  . Alcohol Use: Not on file    Family History  Problem Relation Age of Onset  . Cancer Mother     skin    Allergies  Allergen Reactions  . Shellfish Allergy Swelling and Rash  . Penicillins     REACTION: rash    Medication list has been reviewed and updated.  Outpatient Prescriptions Prior to Visit  Medication Sig Dispense Refill  . aspirin 81 MG tablet Take 81 mg by mouth daily.        . Calcium Carbonate-Vitamin D (TH CALCIUM  CARBONATE-VITAMIN D) 600-400 MG-UNIT per tablet Take 1 tablet by mouth daily.        Marland Kitchen etodolac (LODINE) 400 MG tablet Take 1 tablet (400 mg total) by mouth 2 (two) times daily.  60 tablet  3  . fluticasone (FLONASE) 50 MCG/ACT nasal spray Place 2 sprays into the nose daily.  16 g  6  . LamoTRIgine (LAMICTAL XR) 100 MG TB24 Take 1 tablet by mouth 3 (three) times daily.        . Multiple Vitamin (MULTIVITAMIN) tablet Take 1 tablet by mouth daily.        Marland Kitchen PREMPRO 0.45-1.5 MG per tablet TAKE 1 TABLET BY MOUTH EVERY DAY  28 tablet  5   Last reviewed on 07/13/2012  7:53 AM by Consuello Masse, CMA  Review of Systems:   GEN: No fevers, chills. Nontoxic. Primarily MSK c/o today. MSK: Detailed in the HPI GI: tolerating PO intake without difficulty Neuro: No numbness, parasthesias, or tingling associated. Otherwise the pertinent positives of the ROS are noted above.    Physical Examination: Filed Vitals:   07/13/12 0749  BP: 120/82  Pulse: 85  Temp: 98.2 F (36.8 C)  TempSrc: Oral  Height: 5\' 6"  (1.676 m)  Weight: 172 lb 12 oz (78.359 kg)  SpO2: 98%    Body mass index is 27.88 kg/(m^2). Ideal Body Weight: Weight in (lb) to have BMI = 25: 154.6    GEN: WDWN, NAD, Non-toxic, Alert & Oriented x 3 HEENT: Atraumatic, Normocephalic.  Ears and Nose: No external deformity. EXTR: No clubbing/cyanosis/edema NEURO: Normal gait.  PSYCH: Normally interactive. Conversant. Not depressed or anxious appearing.  Calm demeanor.   Knee:  R Gait: Normal heel toe pattern ROM: 0-125 Effusion: neg Echymosis or edema: none Patellar tendon NT Painful PLICA: neg Patellar grind: negative Medial and lateral patellar facet loading: negative medial and lateral joint lines:NT Mcmurray's neg Flexion-pinch neg Varus and valgus stress: stable Lachman: neg Ant and Post drawer: neg Hip abduction, IR, ER: WNL Hip flexion str: 5/5 Hip abd: 5/5 Quad: 5/5 VMO atrophy:No Hamstring concentric and  eccentric: 5/5   Assessment and Plan: 1. Right knee pain     Doing well post-inj.  nsaids prn Keep working on quad str  F/u prn  Hannah Beat, MD

## 2012-07-18 ENCOUNTER — Ambulatory Visit (INDEPENDENT_AMBULATORY_CARE_PROVIDER_SITE_OTHER): Payer: Managed Care, Other (non HMO) | Admitting: Internal Medicine

## 2012-07-18 ENCOUNTER — Encounter: Payer: Self-pay | Admitting: Internal Medicine

## 2012-07-18 VITALS — BP 140/80 | HR 89 | Temp 98.4°F | Wt 173.0 lb

## 2012-07-18 DIAGNOSIS — J069 Acute upper respiratory infection, unspecified: Secondary | ICD-10-CM

## 2012-07-18 MED ORDER — HYDROCODONE-HOMATROPINE 5-1.5 MG/5ML PO SYRP: 5.0000 mL | ORAL_SOLUTION | Freq: Every evening | ORAL | Status: DC | PRN

## 2012-07-18 NOTE — Patient Instructions (Addendum)
Please try ibuprofen 400-600mg  up to three times a day or naproxen 220mg  two tabs twice a day for the sore throat.  Call back later in the week if you are not feeling better

## 2012-07-18 NOTE — Progress Notes (Signed)
  Subjective:    Patient ID: Lisa Yu, female    DOB: [birth year], 61 y.o.   MRN: [id number]  HPI Having an "awful" sore throat Flu in family Right ear pain Stuffiness Started 2 days ago or so No fever Some chills though and sweats  Some cough---mild. Slight mucus No sig nasal drainage---just a little  Gargling with salt water dayquil not helping  Current Outpatient Prescriptions on File Prior to Visit  Medication Sig Dispense Refill  . aspirin 81 MG tablet Take 81 mg by mouth daily.        . Calcium Carbonate-Vitamin D (TH CALCIUM CARBONATE-VITAMIN D) 600-400 MG-UNIT per tablet Take 1 tablet by mouth daily.        Marland Kitchen etodolac (LODINE) 400 MG tablet Take 1 tablet (400 mg total) by mouth 2 (two) times daily.  60 tablet  3  . fluticasone (FLONASE) 50 MCG/ACT nasal spray Place 2 sprays into the nose daily.  16 g  6  . LamoTRIgine (LAMICTAL XR) 100 MG TB24 Take 1 tablet by mouth 3 (three) times daily.        . Multiple Vitamin (MULTIVITAMIN) tablet Take 1 tablet by mouth daily.        Marland Kitchen PREMPRO 0.45-1.5 MG per tablet TAKE 1 TABLET BY MOUTH EVERY DAY  28 tablet  5    Allergies  Allergen Reactions  . Shellfish Allergy Swelling and Rash  . Penicillins     REACTION: rash    Past Medical History  Diagnosis Date  . Diverticulosis   . Osteopenia 12/21/2007    Past Surgical History  Procedure Date  . Vaginal delivery     x 1  . Breast surgery 1983    bilateral implants  . Cervical disease 1999    H/O Cryo  . Lasik 2007  . Colonoscopy 05/01/2005    Family History  Problem Relation Age of Onset  . Cancer Mother     skin    History   Social History  . Marital Status: Married    Spouse Name: N/A    Number of Children: 1  . Years of Education: N/A   Occupational History  . Retired from [employer]    Social History Main Topics  . Smoking status: Never Smoker   . Smokeless tobacco: Never Used  . Alcohol Use: No  . Drug Use: No  . Sexually Active: Not on  file   Other Topics Concern  . Not on file   Social History Narrative  . No narrative on file   Review of Systems No rash No vomiting or diarrhea Appetite fair     Objective:   Physical Exam  Constitutional: She appears well-developed. No distress.       Coarse cough  HENT:       Slight pharyngeal injection --no exudates or petechiae Very slight frontal tenderness Mild nasal congestion TMs normal  Neck: Normal range of motion. Neck supple.  Pulmonary/Chest: Effort normal and breath sounds normal. No respiratory distress. She has no wheezes. She has no rales.  Lymphadenopathy:    She has no cervical adenopathy.          Assessment & Plan:

## 2012-07-18 NOTE — Assessment & Plan Note (Signed)
Discussed viral etiology Will give hydrocodone for cough Discussed symptomatic care If worsening later in week, would try empiric z-pak then

## 2012-07-21 ENCOUNTER — Telehealth: Payer: Self-pay | Admitting: Family Medicine

## 2012-07-21 MED ORDER — AZITHROMYCIN 250 MG PO TABS: ORAL_TABLET | ORAL | Status: DC

## 2012-07-21 NOTE — Telephone Encounter (Signed)
There is only 1 Z-pak, I thought. The 500mg  one is for 3 days and is called something else (I thought) 250mg   #6 x 0 2 tabs today, then 1 tab daily for following 4 days

## 2012-07-21 NOTE — Telephone Encounter (Signed)
Spoke with patient and advised results rx sent to pharmacy by e-script  

## 2012-07-21 NOTE — Telephone Encounter (Signed)
Dr.Letvak which z-pak did you want per your office note? 250 mg or 500 mg, I was going to send in but didn't know which one. Please advise

## 2012-07-21 NOTE — Telephone Encounter (Signed)
Patient Information:  Caller Name: [facility]  Phone: [phone]  Patient: Lisa Yu  Gender: Female  DOB: [birth year]  Age: 61 Years  PCP: Ruthe Mannan [facility])  Office Follow Up:  Does the office need to follow up with this patient?: Yes  Instructions For The Office: OFFICE WILL NEED TO CALL IN MEDICATION PER EPIC . PER DR. Alphonsus Sias NOTES.  RN Note:  Reviewed EPIC and verified that Dr. Alphonsus Sias stated he would give Zpack if not better. Patient uses [facility] [phone].PLEASE CALL IN MEDICATION AS DIRECTED BY DR.LETVAK.  Symptoms  Reason For Call & Symptoms: PATIENT CALLING FOR PRESCRIPTION-  Patinet seen in office on 07/18/12 with URI by Dr. Alphonsus Sias. She was told if she was worse to call back in for antibiotic and would not be seen.  Patients states she is worse.  Cold symptoms /congestion/Headache  . Afebrile.  +cough productive green mucus.  Reviewed Health History In EMR: Yes  Reviewed Medications In EMR: Yes  Reviewed Allergies In EMR: Yes  Reviewed Surgeries / Procedures: No  Date of Onset of Symptoms: 07/18/2012  Treatments Tried: Treatment at home with -Dayquil, Ibuprofen/ Hycodon Rx  Treatments Tried Worked: No  Guideline(s) Used:  Colds  Disposition Per Guideline:   Home Care  Reason For Disposition Reached:   Colds with no complications  Advice Given:  Reassurance  It sounds like an uncomplicated cold that we can treat at home.  Colds are very common and may make you feel uncomfortable.  Here is some care advice that should help.  For a Runny Nose With Profuse Discharge:   Nasal mucus and discharge helps to wash viruses and bacteria out of the nose and sinuses.  Blowing the nose is all that is needed.  For a Stuffy Nose - Use Nasal Washes:  How it Helps: The salt water rinses out excess mucus, washes out any irritants (dust, allergens) that might be present, and moistens the nasal cavity.  Methods: There are several ways to perform nasal  irrigation. You can use a saline nasal spray bottle (available over-the-counter), a rubber ear syringe, a medical syringe without the needle, or a Neti Pot.  Humidifier:  If the air in your home is dry, use a cool-mist humidifier  Treatment for Associated Symptoms of Colds:  For muscle aches, headaches, or moderate fever (more than 101 F or 38.9 C): Take acetaminophen every 4 hours.  Sore throat: Try throat lozenges, hard candy, or warm chicken broth.  Cough: Use cough drops.  Hydrate: Drink adequate liquids.  Contagiousness:  The cold virus is present in your nasal secretions.  Cover your nose and mouth with a tissue when you sneeze or cough.  Wash your hands frequently with soap and water.  Call Back If:  Difficulty breathing occurs  Fever lasts more than 3 days  Nasal discharge lasts more than 10 days  Cough lasts more than 3 weeks  You become worse

## 2012-09-23 ENCOUNTER — Other Ambulatory Visit (INDEPENDENT_AMBULATORY_CARE_PROVIDER_SITE_OTHER): Payer: Managed Care, Other (non HMO)

## 2012-09-23 DIAGNOSIS — Z1211 Encounter for screening for malignant neoplasm of colon: Secondary | ICD-10-CM

## 2012-09-26 ENCOUNTER — Encounter: Payer: Self-pay | Admitting: *Deleted

## 2012-09-26 ENCOUNTER — Encounter: Payer: Self-pay | Admitting: Family Medicine

## 2012-09-26 LAB — FECAL OCCULT BLOOD, GUAIAC: Fecal Occult Blood: NEGATIVE

## 2012-10-07 ENCOUNTER — Telehealth: Payer: Self-pay | Admitting: Family Medicine

## 2012-10-07 NOTE — Telephone Encounter (Signed)
Patient Information:  Caller Name: [facility]  Phone: [phone]  Patient: Lisa Yu  Gender: Female  DOB: 20-Apr-1951  Age: 62 Years  PCP: Ruthe Mannan [facility])  Office Follow Up:  Does the office need to follow up with this patient?: Yes  Instructions For The Office: Patient declins appt.and wants to try Immodium that her husband bought her.  Home care instructions and callback parameters reviewed. Understanding expressed.  PATIENT REQUESTING RX TO STOP DIARRHEA  RN Note:  Patient declins appt.and wants to try Immodium that her husband bought her.  Home care instructions and callback parameters reviewed. Understanding expressed.  PATIENT REQUESTING RX TO STOP DIARRHEA  Symptoms  Reason For Call & Symptoms: Patient states onset of GI virus that has run through her family.  Patient began s/sx on Friday 09/30/12.  She does not have vomiting but has diarrhea.  she is has had x17-18 x stools in the last 24 hours.Marland Kitchen loose  to liquid. [place] in color. She is eating and drinking.  Last UOP-this morning  Reviewed Health History In EMR: Yes  Reviewed Medications In EMR: Yes  Reviewed Allergies In EMR: Yes  Reviewed Surgeries / Procedures: No  Date of Onset of Symptoms: 09/30/2012  Treatments Tried: Activa Yogurt, Ginger ale, probiotics  Treatments Tried Worked: No  Guideline(s) Used:  Diarrhea  Disposition Per Guideline:   Go to Office Now  Reason For Disposition Reached:   Age > 60 years and has had > 6 diarrhea stools in past 24 hours  Advice Given:  Reassurance:  In healthy adults, new-onset diarrhea is usually caused by a viral infection of the intestines, which you can treat at home. Diarrhea is the body's way of getting rid of the infection. Here are some tips on how to keep ahead of the fluid losses.  Here is some care advice that should help.  Fluids:  Drink more fluids, at least 8-10 glasses (8 oz or 240 ml) daily.  For example: sports drinks, diluted fruit juices,  soft drinks.  Supplement this with saltine crackers or soups to make certain that you are getting sufficient fluid and salt to meet your body's needs.  Avoid caffeinated beverages (Reason: caffeine is mildly dehydrating).  Nutrition:  Maintaining some food intake during episodes of diarrhea is important.  Ideal initial foods include boiled starches/cereals (e.g., potatoes, rice, noodles, wheat, oats) with a small amount of salt to taste.  Other acceptable foods include: bananas, yogurt, crackers, soup.  As your stools return to normal consistency, resume a normal diet.  Diarrhea Medication  - Imodium AD:   Helps reduce diarrhea.  Adult dosage: 4 mg (2 capsules or 4 teaspoons or 20 ml) is the recommended first dose. You may take an additional 2 mg (1 capsule or 2 teaspoons or 10 ml) after each loose BM.  Caution: Do not use if you have a fever greater than 100F (37.8C). Do not use if there is blood or mucus in your stools. Do not use for more than 2 days.  Expected Course:  Viral diarrhea lasts 4-7 days. Always worse on days 1 and 2.  Call Back If:  Signs of dehydration occur (e.g., no urine for more than 12 hours, very dry mouth, lightheaded, etc.)  Diarrhea lasts over 7 days  You become worse.  Patient Refused Recommendation:  Patient Requests Prescription  Patient declins appt.and wants to try Immodium that her husband bought her.  Home care instructions and callback parameters reviewed. Understanding expressed.  PATIENT REQUESTING  RX TO STOP DIARRHEA

## 2012-10-07 NOTE — Telephone Encounter (Signed)
Advised patient as instructed.  Advised her to avoid immodium.

## 2012-10-07 NOTE — Telephone Encounter (Signed)
Unfortunately, you have to let the diarrhea run it's course.  There is no rx I can given her.  It's best to stay as hydrated as possible.  If she was nauseated, we could send zofran or phenergan.

## 2012-10-28 ENCOUNTER — Encounter: Payer: Self-pay | Admitting: Family Medicine

## 2012-10-28 ENCOUNTER — Ambulatory Visit (INDEPENDENT_AMBULATORY_CARE_PROVIDER_SITE_OTHER): Payer: 59 | Admitting: Family Medicine

## 2012-10-28 VITALS — BP 122/78 | HR 70 | Temp 97.9°F | Ht 66.5 in | Wt 171.2 lb

## 2012-10-28 DIAGNOSIS — J019 Acute sinusitis, unspecified: Secondary | ICD-10-CM

## 2012-10-28 MED ORDER — HYDROCODONE-HOMATROPINE 5-1.5 MG/5ML PO SYRP: 5.0000 mL | ORAL_SOLUTION | Freq: Every evening | ORAL | Status: DC | PRN

## 2012-10-28 MED ORDER — AZITHROMYCIN 250 MG PO TABS: ORAL_TABLET | ORAL | Status: DC

## 2012-10-28 NOTE — Assessment & Plan Note (Signed)
Given duration of sxs, will treat as sinusitis with zpack and hycodan.  Concern for developing bronchitis - discussed supportive care as per instructions. If not better, return for further evaluation.

## 2012-10-28 NOTE — Patient Instructions (Signed)
You have a sinus infection. Take medicine as prescribed: zpack antibiotic and hycodan cough syrup May use ibuprofen for inflammation Push fluids and plenty of rest. Nasal saline irrigation or neti pot to help drain sinuses. May use plain mucinex or immediate release guaifenesin with plenty of fluid to help mobilize mucous. Let us know if fever >101.5, trouble opening/closing mouth, difficulty swallowing, or worsening - you may need to be seen again.

## 2012-10-28 NOTE — Progress Notes (Signed)
  Subjective:    Patient ID: Lisa Yu, female    DOB: 08/17/1950, 62 y.o.   MRN: [id number]  HPI CC: sinusitis?  10d h/o productive cough.  Purulent mucous when blowing nose as well.  + coryza, mild pressure headache.  + coughing fits.  gums sore (h/o dental implants).  + PNdrainage. Preceding viral gastroenteritis.  So far has tried mucinex which hasn't helped.  Has increased water intake.  No fevers/chills, abd pain, nausea, ear or tooth pain, ST, chest pain or shortness of breath.  Chronic allergic rhinitis, treats with flonase. No other sick contacts. No smokers at home. No h/o asthma.  Past Medical History  Diagnosis Date  . Diverticulosis   . Osteopenia 12/21/2007    Review of Systems Per HPI    Objective:   Physical Exam  Nursing note and vitals reviewed. Constitutional: She appears well-developed and well-nourished. No distress.  HENT:  Head: Normocephalic and atraumatic.  Right Ear: Hearing, tympanic membrane, external ear and ear canal normal.  Left Ear: Hearing, tympanic membrane, external ear and ear canal normal.  Nose: Mucosal edema present. No rhinorrhea. Right sinus exhibits maxillary sinus tenderness. Right sinus exhibits no frontal sinus tenderness. Left sinus exhibits maxillary sinus tenderness. Left sinus exhibits no frontal sinus tenderness.  Mouth/Throat: Uvula is midline and mucous membranes are normal. Posterior oropharyngeal erythema present. No oropharyngeal exudate, posterior oropharyngeal edema or tonsillar abscesses.  Pale turbinates  White post nasal drainage   Eyes: Conjunctivae and EOM are normal. Pupils are equal, round, and reactive to light. No scleral icterus.  Neck: Normal range of motion. Neck supple.  Cardiovascular: Normal rate, regular rhythm, normal heart sounds and intact distal pulses.   No murmur heard. Pulmonary/Chest: Effort normal and breath sounds normal. No respiratory distress. She has no wheezes. She has no rales.   Harsh cough present. Faint exp wheezing with forced exhalation  Lymphadenopathy:    She has no cervical adenopathy.  Skin: Skin is warm and dry. No rash noted.       Assessment & Plan:

## 2012-11-28 ENCOUNTER — Other Ambulatory Visit: Payer: Self-pay

## 2012-11-28 ENCOUNTER — Telehealth: Payer: Self-pay | Admitting: Nurse Practitioner

## 2012-11-28 MED ORDER — CONJ ESTROG-MEDROXYPROGEST ACE 0.45-1.5 MG PO TABS: ORAL_TABLET | ORAL | Status: DC

## 2012-11-28 NOTE — Telephone Encounter (Signed)
Pt said she had gotten prempro ordered by another doctor but now her insurance said can get prempro less expensive if sent in for 90 day rx to [facility]. Please advise.

## 2012-12-26 ENCOUNTER — Telehealth: Payer: Self-pay | Admitting: Neurology

## 2012-12-26 MED ORDER — LAMICTAL XR 100 MG PO TB24: 4.0000 | ORAL_TABLET | Freq: Every day | ORAL | Status: DC

## 2012-12-26 NOTE — Telephone Encounter (Signed)
By viewing chart I can see that the patient did call on 05/05, however, the phone note was never routed to anyone, so we did not know she called.  I refilled Rx for 90 day supply, enough to last until appt.   I called patient back.  She is aware 90 day Rx was Sent.

## 2013-03-01 ENCOUNTER — Ambulatory Visit: Payer: Self-pay | Admitting: Nurse Practitioner

## 2013-03-25 ENCOUNTER — Other Ambulatory Visit: Payer: Self-pay | Admitting: Neurology

## 2013-03-28 ENCOUNTER — Telehealth: Payer: Self-pay | Admitting: Nurse Practitioner

## 2013-03-28 MED ORDER — LAMICTAL XR 100 MG PO TB24: 400.0000 mg | ORAL_TABLET | Freq: Every day | ORAL | Status: DC

## 2013-03-28 NOTE — Telephone Encounter (Signed)
Rx sent 

## 2013-04-13 ENCOUNTER — Ambulatory Visit: Payer: Self-pay | Admitting: Nurse Practitioner

## 2013-04-26 ENCOUNTER — Ambulatory Visit: Payer: Self-pay | Admitting: Nurse Practitioner

## 2013-05-09 ENCOUNTER — Ambulatory Visit (INDEPENDENT_AMBULATORY_CARE_PROVIDER_SITE_OTHER): Payer: 59 | Admitting: Nurse Practitioner

## 2013-05-09 ENCOUNTER — Encounter: Payer: Self-pay | Admitting: Nurse Practitioner

## 2013-05-09 VITALS — BP 126/76 | HR 69 | Ht 67.25 in | Wt 169.0 lb

## 2013-05-09 DIAGNOSIS — G40209 Localization-related (focal) (partial) symptomatic epilepsy and epileptic syndromes with complex partial seizures, not intractable, without status epilepticus: Secondary | ICD-10-CM

## 2013-05-09 MED ORDER — LAMICTAL XR 100 MG PO TB24: 400.0000 mg | ORAL_TABLET | Freq: Every day | ORAL | Status: DC

## 2013-05-09 NOTE — Progress Notes (Signed)
[name] DOB: 02/15/1951   REASON FOR VISIT: follow up for seizure    HISTORY OF PRESENT ILLNESS:[name], is a 62 year-old Caucasian female with history of complex partial seizures with secondary generalization. She was last seen 02/25/12.   She is taking and tolerating Lamictal XR without side effect. Her last seizure occurred in July, 2010, on a day of traveling. She says that she was tired and experienced a funny feeling like when she had seizures in the past, but no overt seizure activity. She has had no further auras or seizures. She is retired and keeps her grandchildren. No new neurological complaints.     REVIEW OF SYSTEMS: Full 14 system review of systems performed and notable only for:  Constitutional: N/A  Cardiovascular: N/A  Ear/Nose/Throat: N/A  Skin: N/A  Eyes: N/A  Respiratory: N/A  Gastroitestinal: N/A  Hematology/Lymphatic: N/A  Endocrine: N/A Musculoskeletal:N/A  Allergy/Immunology: N/A  Neurological: N/A Psychiatric: N/A   ALLERGIES: Allergies  Allergen Reactions  . Shellfish Allergy Swelling and Rash  . Penicillins     REACTION: rash    HOME MEDICATIONS: Outpatient Prescriptions Prior to Visit  Medication Sig Dispense Refill  . aspirin 81 MG tablet Take 81 mg by mouth daily.        . Calcium Carbonate-Vitamin D (TH CALCIUM CARBONATE-VITAMIN D) 600-400 MG-UNIT per tablet Take 1 tablet by mouth daily.        Marland Kitchen estrogen, conjugated,-medroxyprogesterone (PREMPRO) 0.45-1.5 MG per tablet TAKE 1 TABLET BY MOUTH EVERY DAY  90 tablet  1  . LAMICTAL XR 100 MG TB24 Take 4 tablets (400 mg total) by mouth at bedtime.  360 tablet  0  . Multiple Vitamin (MULTIVITAMIN) tablet Take 1 tablet by mouth daily.        . fluticasone (FLONASE) 50 MCG/ACT nasal spray Place 2 sprays into the nose daily.  16 g  6  . azithromycin (ZITHROMAX Z-PAK) 250 MG tablet Two on day 1 followed by one daily for 4 days for total of 5 days,  PO  6 each  0  . etodolac (LODINE) 400 MG tablet Take 1 tablet (400 mg total) by mouth 2 (two) times daily.  60 tablet  3  . HYDROcodone-homatropine (HYCODAN) 5-1.5 MG/5ML syrup Take 5 mLs by mouth at bedtime as needed for cough.  120 mL  0   No facility-administered medications prior to visit.    PAST MEDICAL HISTORY: Past Medical History  Diagnosis Date  . Diverticulosis   . Osteopenia 12/21/2007  . Seizures     PAST SURGICAL HISTORY: Past Surgical History  Procedure Laterality Date  . Vaginal delivery      x 1  . Breast surgery  1983    bilateral implants  . Cervical disease  1999    H/O Cryo  . Lasik  2007  . Colonoscopy  05/01/2005    FAMILY HISTORY: Family History  Problem Relation Age of Onset  . Cancer Mother     skin    SOCIAL HISTORY: History   Social History  . Marital Status: Married    Spouse Name: N/A    Number of Children: 1  . Years of Education: High school diploma   Occupational History  . Retired from [employer]    Social History Main Topics  . Smoking status: Never Smoker   . Smokeless tobacco: Never Used  . Alcohol Use: No  . Drug Use: No  . Sexual Activity: Not  on file   Other Topics Concern  . Not on file   Social History Narrative  . No narrative on file     PHYSICAL EXAM  Filed Vitals:   05/09/13 0933  BP: 126/76  Pulse: 69  Height: 5' 7.25" (1.708 m)  Weight: 169 lb (76.658 kg)   Body mass index is 26.28 kg/(m^2).  Generalized: Well developed, in no acute distress  Head: normocephalic and atraumatic,. Oropharynx benign  Neck: Supple, no carotid bruits  Cardiac: Regular rate rhythm, no murmur  Musculoskeletal: No deformity   Neurological examination   Mentation: Alert oriented to time, place, history taking. Follows all commands speech and language fluent  Cranial nerve II-XII: .Pupils were equal round reactive to light extraocular movements were full, visual field were full on confrontational test. Facial  sensation and strength were normal. hearing was intact to finger rubbing bilaterally. Uvula tongue midline. head turning and shoulder shrug and were normal and symmetric.Tongue protrusion into cheek strength was normal. Motor: normal bulk and tone, full strength in the BUE, BLE, fine finger movements normal, no pronator drift. No focal weakness Sensory: normal and symmetric to light touch, pinprick, and  vibration  Coordination: finger-nose-finger, heel-to-shin bilaterally, no dysmetria Reflexes: 1+ upper, lower and symmetric Gait and Station: Rising up from seated position without assistance, normal stance, moderate stride, good arm swing, smooth turning, able to perform tiptoe, and heel walking without difficulty. Tandem steady.   DIAGNOSTIC DATA (LABS, IMAGING, TESTING) - I reviewed patient records, labs, notes, testing and imaging myself where available.  Lab Results  Component Value Date   WBC 5.0 05/17/2012   HGB 13.7 05/17/2012   HCT 42.0 05/17/2012   MCV 91.6 05/17/2012   PLT 296.0 05/17/2012      Component Value Date/Time   NA 138 05/17/2012 0839   K 4.2 05/17/2012 0839   CL 102 05/17/2012 0839   CO2 29 05/17/2012 0839   GLUCOSE 82 05/17/2012 0839   BUN 16 05/17/2012 0839   CREATININE 0.7 05/17/2012 0839   CALCIUM 9.1 05/17/2012 0839   PROT 7.2 05/17/2012 0839   ALBUMIN 4.0 05/17/2012 0839   AST 31 05/17/2012 0839   ALT 35 05/17/2012 0839   ALKPHOS 52 05/17/2012 0839   BILITOT 0.4 05/17/2012 0839   GFRNONAA 87.85 12/31/2009 0953   GFRAA 111 12/21/2007 0816   Lab Results  Component Value Date   CHOL 176 05/17/2012   HDL 52.60 05/17/2012   LDLCALC 109* 05/17/2012   LDLDIRECT 139.7 01/29/2011   TRIG 71.0 05/17/2012   CHOLHDL 3 05/17/2012       ASSESSMENT AND PLAN  62 y.o. year old female  has a past medical history of Diverticulosis; Osteopenia (12/21/2007); and Seizures. here for followup. Last seizure 2010. Currently taking and tolerating Lamictal XR with out  side effects.   Pt to continue Lamictal XR will refill for 3 months with 3 refills F/U yearly and prn Nilda Riggs, [facility], [facility], APRN  [facility] [address], [address] [place], [place] [address] [phone]

## 2013-05-09 NOTE — Patient Instructions (Signed)
Pt to continue Lamictal XR will refill for 3 months with 3 refills F/U yearly and prn

## 2013-06-20 ENCOUNTER — Encounter: Payer: Self-pay | Admitting: Family Medicine

## 2013-06-20 ENCOUNTER — Ambulatory Visit (INDEPENDENT_AMBULATORY_CARE_PROVIDER_SITE_OTHER): Payer: 59 | Admitting: Family Medicine

## 2013-06-20 VITALS — BP 162/80 | HR 105 | Temp 98.6°F | Wt 174.0 lb

## 2013-06-20 DIAGNOSIS — R030 Elevated blood-pressure reading, without diagnosis of hypertension: Secondary | ICD-10-CM | POA: Insufficient documentation

## 2013-06-20 DIAGNOSIS — J209 Acute bronchitis, unspecified: Secondary | ICD-10-CM | POA: Insufficient documentation

## 2013-06-20 DIAGNOSIS — IMO0001 Reserved for inherently not codable concepts without codable children: Secondary | ICD-10-CM

## 2013-06-20 MED ORDER — AZITHROMYCIN 250 MG PO TABS: ORAL_TABLET | ORAL | Status: DC

## 2013-06-20 MED ORDER — BENZONATATE 200 MG PO CAPS: 200.0000 mg | ORAL_CAPSULE | Freq: Three times a day (TID) | ORAL | Status: DC | PRN

## 2013-06-20 NOTE — Progress Notes (Signed)
  Subjective:    Patient ID: Lisa Yu, female    DOB: 07-Apr-1951, 62 y.o.   MRN: [id number]  Cough This is a new problem. The current episode started in the past 7 days. The problem has been gradually worsening. The problem occurs constantly. The cough is productive of sputum. Associated symptoms include ear congestion, ear pain, headaches, myalgias, nasal congestion and a sore throat. Pertinent negatives include no chest pain, fever, hemoptysis, rash, shortness of breath or wheezing. Associated symptoms comments: Left ear pain yesterday. The symptoms are aggravated by lying down (cough keeping her up at night). Treatments tried: mucinex max, ibuprofen... stopped hydrocodone cough syrup due to rash. The treatment provided mild relief. Her past medical history is significant for bronchitis. There is no history of asthma, bronchiectasis, COPD, emphysema, environmental allergies or pneumonia. no HTN, nonsmoker      Review of Systems  Constitutional: Negative for fever.  HENT: Positive for ear pain and sore throat.   Respiratory: Positive for cough. Negative for hemoptysis, shortness of breath and wheezing.   Cardiovascular: Negative for chest pain.  Musculoskeletal: Positive for myalgias.  Skin: Negative for rash.  Allergic/Immunologic: Negative for environmental allergies.  Neurological: Positive for headaches.       Objective:   Physical Exam        Assessment & Plan:

## 2013-06-20 NOTE — Assessment & Plan Note (Signed)
>   2 weeks of symptoms.. Cover with antibiotics.

## 2013-06-20 NOTE — Assessment & Plan Note (Signed)
Follow at home.. Call if greater than 140/90.

## 2013-06-20 NOTE — Patient Instructions (Addendum)
Follow blood pressure at pharmacy.. Goal < 140/90.  Avoid decongestant.  Continuing plain mucinex or  mucinex DM.  Complete antibiotoics.  Fluids.  Call if not improving in 7-10 days, call sooner if shortness of breath. Go to ER if severe shortness of breath.

## 2013-06-21 ENCOUNTER — Telehealth: Payer: Self-pay

## 2013-06-21 ENCOUNTER — Ambulatory Visit (INDEPENDENT_AMBULATORY_CARE_PROVIDER_SITE_OTHER): Payer: 59 | Admitting: Podiatry

## 2013-06-21 ENCOUNTER — Other Ambulatory Visit: Payer: Self-pay | Admitting: Internal Medicine

## 2013-06-21 ENCOUNTER — Encounter: Payer: Self-pay | Admitting: Podiatry

## 2013-06-21 ENCOUNTER — Ambulatory Visit (INDEPENDENT_AMBULATORY_CARE_PROVIDER_SITE_OTHER): Payer: 59

## 2013-06-21 VITALS — BP 124/79 | HR 90 | Resp 16 | Ht 66.0 in | Wt 147.0 lb

## 2013-06-21 DIAGNOSIS — M79671 Pain in right foot: Secondary | ICD-10-CM

## 2013-06-21 DIAGNOSIS — M79609 Pain in unspecified limb: Secondary | ICD-10-CM

## 2013-06-21 MED ORDER — PROMETHAZINE-DM 6.25-15 MG/5ML PO SYRP: 5.0000 mL | ORAL_SOLUTION | Freq: Four times a day (QID) | ORAL | Status: DC | PRN

## 2013-06-21 NOTE — Telephone Encounter (Signed)
Patient notified as instructed by telephone. 

## 2013-06-21 NOTE — Progress Notes (Signed)
Lisa Yu presents today as a 62 year old white female after having I seen her since we future fourth toes bilateral. She states that her fourth toes are painful on occasion like to have the screws removed at all possible she states with these has been perfectly straight is causing more tenderness with the other toes. She says the toes also painful when it's cold outside.  Objective: I have reviewed her past medical history medications and allergies. Vital signs are stable she is alert and oriented x3. Pulses are strong and palpable bilateral. She does have tenderness on palpation of the fourth digits bilateral. These fourth toes are painful with palpation radiographically speaking the screws intact and arthrodesis is complete. I see no reason we shouldn't remove the root.  Assessment: Pain in limb secondary to painful internal fixation fourth toes bilateral.  Plan: We discussed the etiology pathology conservative versus surgical therapies. I did relate to her that there is an off chance that we may not be able to get the screws out. She was fine with this she would like to try any way. We went over consent form today line bylined number by number giving her ample time to ask questions she saw fit regarding screw removal fourth toes bilateral answered all the questions regarding these procedures best Troy Sine terms she understood it was amenable to it signed Dr. pages of the consent form. I will followup with her in the near future. Currently she does have acute bronchitis and we will be unable to perform any type of anesthesia until she is at least 3 weeks post bronchitis.

## 2013-06-21 NOTE — Telephone Encounter (Signed)
I have called her promethazine-dextromethorphan cough syrup to her pharmacy

## 2013-06-21 NOTE — Telephone Encounter (Signed)
Pt left v/m; was seen 06/20/13 and request stronger cough med; tessalon not helping cough;pt is allergic to hydrocodone homatropine,hives and itching.[facility]Please advise.

## 2013-06-25 ENCOUNTER — Other Ambulatory Visit: Payer: Self-pay | Admitting: Neurology

## 2013-06-28 ENCOUNTER — Encounter: Payer: Self-pay | Admitting: Podiatry

## 2013-07-21 DIAGNOSIS — Z472 Encounter for removal of internal fixation device: Secondary | ICD-10-CM

## 2013-07-25 ENCOUNTER — Encounter: Payer: Self-pay | Admitting: Podiatry

## 2013-07-26 ENCOUNTER — Encounter: Payer: Self-pay | Admitting: Podiatry

## 2013-07-26 ENCOUNTER — Ambulatory Visit (INDEPENDENT_AMBULATORY_CARE_PROVIDER_SITE_OTHER): Payer: 59 | Admitting: Podiatry

## 2013-07-26 ENCOUNTER — Ambulatory Visit (INDEPENDENT_AMBULATORY_CARE_PROVIDER_SITE_OTHER): Payer: 59

## 2013-07-26 VITALS — BP 142/82 | HR 69 | Resp 16 | Ht 66.0 in | Wt 174.0 lb

## 2013-07-26 DIAGNOSIS — Z9889 Other specified postprocedural states: Secondary | ICD-10-CM

## 2013-07-26 NOTE — Progress Notes (Signed)
Lisa Yu presents today 5 days status post screw removal fourth digit bilateral. She denies fever chills nausea vomiting muscle aches and pains. She does relate that the toes are quite tender.  Objective: Dry sterile dressings were removed today vital signs are stable she is alert and oriented x3. Minimal edema about the toes no erythema saline is drainage or odor radiographs demonstrate nice arthrodesis referred removal of screws fourth digit bilateral.  Assessment: 5 days status post screw removal fourth digits bilateral.  Plan: I will allow her to start getting this wet and soaking in Epsom salts warm water. She's to dress these daily with compression dressings and I will followup with her in one week I advised her against wearing any other shoes and the [place]co shoes she was presented.

## 2013-08-02 ENCOUNTER — Ambulatory Visit (INDEPENDENT_AMBULATORY_CARE_PROVIDER_SITE_OTHER): Payer: 59 | Admitting: Podiatry

## 2013-08-02 ENCOUNTER — Encounter: Payer: Self-pay | Admitting: Podiatry

## 2013-08-02 VITALS — BP 140/84 | HR 72 | Temp 98.5°F | Resp 18

## 2013-08-02 DIAGNOSIS — Z9889 Other specified postprocedural states: Secondary | ICD-10-CM

## 2013-08-02 NOTE — Progress Notes (Signed)
   Subjective:    Patient ID: Lisa Yu, female    DOB: 09-07-1950, 63 y.o.   MRN: [id number]  HPI Comments: Post op 4th digits bilateral , still really sore dos 12.26.14     Review of Systems     Objective:   Physical Exam: She presents today now little more than 2 weeks status post screw removal fourth digits bilateral. Sutures are intact margins appear to be well coapted. With the sutures removed she has some dehiscence of the incision site of the fourth toe but is only superficial. And there no signs of infection.        Assessment & Plan:  Assessment: Well-healing surgical toes removal internal fixation fourth bilateral.  Plan: She will Steri-Strips the end of the fourth toe right foot just until the wound is healed 100% she'll start soaking in Epsom salts warm water within the next day or 2 and then start showering normally.

## 2013-08-30 ENCOUNTER — Ambulatory Visit (INDEPENDENT_AMBULATORY_CARE_PROVIDER_SITE_OTHER): Payer: 59

## 2013-08-30 ENCOUNTER — Encounter: Payer: Self-pay | Admitting: Podiatry

## 2013-08-30 ENCOUNTER — Ambulatory Visit (INDEPENDENT_AMBULATORY_CARE_PROVIDER_SITE_OTHER): Payer: 59 | Admitting: Podiatry

## 2013-08-30 VITALS — BP 133/82 | HR 76 | Resp 16

## 2013-08-30 DIAGNOSIS — Z9889 Other specified postprocedural states: Secondary | ICD-10-CM

## 2013-08-30 NOTE — Progress Notes (Signed)
Post op 12.26.14 4th digits bilateral , pt states that it is still tender.  Objective: Vital signs are stable she is alert and oriented x3. Screw removal fourth toes bilateral gone to heal quite nicely radiographic evaluation demonstrates consolidation of the toes were the screws removed history healing quite nicely.  Assessment: Well-healing surgical fourth toes bilateral.  Plan: We will followup with her on as-needed basis.

## 2013-09-25 ENCOUNTER — Telehealth: Payer: Self-pay | Admitting: Family Medicine

## 2013-09-25 NOTE — Telephone Encounter (Signed)
Pt request jury duty excusal. Pt would like Dr. Deborra Medina to call her and talk about this. Pt would not give me any detailed information.

## 2013-09-25 NOTE — Telephone Encounter (Signed)
Please call her to get more information-i.e. Why she would like to be excused.  Also, we will need a copy of her jury duty letter.

## 2013-09-25 NOTE — Telephone Encounter (Signed)
I received a message on my vm from pt indicating that since there was " a problem" for her to receive letter to cancel request. Unfortunately, we are not able to dismiss a pt from jury duty without a valid medical reason, as I informed her when we spoke.

## 2013-09-25 NOTE — Telephone Encounter (Signed)
Pt states that she has IBS and back problems. She states that you spoke with her at her previous physical, but that was in 2013. She states taht she is not having any IBS flare-ups nor any back problems at this current time. Her jury duty date is  10/11/13 and states that she will fax a copy of the letter to office

## 2013-10-30 ENCOUNTER — Other Ambulatory Visit: Payer: Self-pay | Admitting: Family Medicine

## 2013-10-30 NOTE — Telephone Encounter (Signed)
Pt request [facility] day rx [facility]; pt has CPX scheduled on 11/30/13. Last pap 05/20/12. Pt request 90 days due to cost.Please advise.

## 2013-11-30 ENCOUNTER — Other Ambulatory Visit (HOSPITAL_COMMUNITY)
Admission: RE | Admit: 2013-11-30 | Discharge: 2013-11-30 | Disposition: A | Discharge status: Home / Self Care | Payer: Managed Care, Other (non HMO) | Source: Ambulatory Visit | Attending: Family Medicine | Admitting: Family Medicine

## 2013-11-30 ENCOUNTER — Encounter: Payer: Self-pay | Admitting: Family Medicine

## 2013-11-30 ENCOUNTER — Ambulatory Visit (INDEPENDENT_AMBULATORY_CARE_PROVIDER_SITE_OTHER): Payer: Managed Care, Other (non HMO) | Admitting: Family Medicine

## 2013-11-30 ENCOUNTER — Encounter: Payer: Self-pay | Admitting: Gastroenterology

## 2013-11-30 VITALS — BP 138/82 | HR 64 | Temp 97.9°F | Ht 66.25 in | Wt 174.8 lb

## 2013-11-30 DIAGNOSIS — K589 Irritable bowel syndrome without diarrhea: Secondary | ICD-10-CM

## 2013-11-30 DIAGNOSIS — Z1211 Encounter for screening for malignant neoplasm of colon: Secondary | ICD-10-CM

## 2013-11-30 DIAGNOSIS — R030 Elevated blood-pressure reading, without diagnosis of hypertension: Secondary | ICD-10-CM

## 2013-11-30 DIAGNOSIS — Z01419 Encounter for gynecological examination (general) (routine) without abnormal findings: Secondary | ICD-10-CM | POA: Insufficient documentation

## 2013-11-30 DIAGNOSIS — Z0000 Encounter for general adult medical examination without abnormal findings: Secondary | ICD-10-CM

## 2013-11-30 DIAGNOSIS — G40209 Localization-related (focal) (partial) symptomatic epilepsy and epileptic syndromes with complex partial seizures, not intractable, without status epilepticus: Secondary | ICD-10-CM

## 2013-11-30 DIAGNOSIS — Z136 Encounter for screening for cardiovascular disorders: Secondary | ICD-10-CM

## 2013-11-30 DIAGNOSIS — Z79890 Hormone replacement therapy (postmenopausal): Secondary | ICD-10-CM

## 2013-11-30 DIAGNOSIS — Z1231 Encounter for screening mammogram for malignant neoplasm of breast: Secondary | ICD-10-CM

## 2013-11-30 LAB — CBC WITH DIFFERENTIAL/PLATELET
BASOS ABS: 0 10*3/uL (ref 0.0–0.1)
BASOS PCT: 0.6 % (ref 0.0–3.0)
Eosinophils Absolute: 0.1 10*3/uL (ref 0.0–0.7)
Eosinophils Relative: 1.2 % (ref 0.0–5.0)
HCT: 41.5 % (ref 36.0–46.0)
Hemoglobin: 13.8 g/dL (ref 12.0–15.0)
Lymphocytes Relative: 32.6 % (ref 12.0–46.0)
Lymphs Abs: 1.9 10*3/uL (ref 0.7–4.0)
MCHC: 33.3 g/dL (ref 30.0–36.0)
MCV: 90.5 fl (ref 78.0–100.0)
MONO ABS: 0.5 10*3/uL (ref 0.1–1.0)
Monocytes Relative: 8.5 % (ref 3.0–12.0)
NEUTROS PCT: 57.1 % (ref 43.0–77.0)
Neutro Abs: 3.3 10*3/uL (ref 1.4–7.7)
PLATELETS: 314 10*3/uL (ref 150.0–400.0)
RBC: 4.58 Mil/uL (ref 3.87–5.11)
RDW: 14.8 % (ref 11.5–15.5)
WBC: 5.8 10*3/uL (ref 4.0–10.5)

## 2013-11-30 LAB — COMPREHENSIVE METABOLIC PANEL
ALK PHOS: 49 U/L (ref 39–117)
ALT: 23 U/L (ref 0–35)
AST: 23 U/L (ref 0–37)
Albumin: 4.3 g/dL (ref 3.5–5.2)
BUN: 18 mg/dL (ref 6–23)
CO2: 28 mEq/L (ref 19–32)
Calcium: 9.2 mg/dL (ref 8.4–10.5)
Chloride: 106 mEq/L (ref 96–112)
Creatinine, Ser: 0.7 mg/dL (ref 0.4–1.2)
GFR: 86.73 mL/min (ref >60.00)
Glucose, Bld: 90 mg/dL (ref 70–99)
POTASSIUM: 4.2 meq/L (ref 3.5–5.1)
Sodium: 139 mEq/L (ref 135–145)
Total Bilirubin: 1 mg/dL (ref 0.2–1.2)
Total Protein: 7 g/dL (ref 6.0–8.3)

## 2013-11-30 LAB — LIPID PANEL
CHOL/HDL RATIO: 3
Cholesterol: 185 mg/dL (ref 0–200)
HDL: 61 mg/dL (ref >39.00)
LDL Cholesterol: 115 mg/dL — ABNORMAL HIGH (ref 0–99)
TRIGLYCERIDES: 47 mg/dL (ref 0.0–149.0)
VLDL: 9.4 mg/dL (ref 0.0–40.0)

## 2013-11-30 LAB — TSH: TSH: 2.81 u[IU]/mL (ref 0.35–4.50)

## 2013-11-30 MED ORDER — CONJ ESTROG-MEDROXYPROGEST ACE 0.45-1.5 MG PO TABS: ORAL_TABLET | ORAL | Status: DC

## 2013-11-30 MED ORDER — CONJ ESTROG-MEDROXYPROGEST ACE 0.3-1.5 MG PO TABS: 1.0000 | ORAL_TABLET | Freq: Every day | ORAL | Status: DC

## 2013-11-30 NOTE — Assessment & Plan Note (Signed)
Reviewed preventive care protocols, scheduled due services, and updated immunizations Discussed nutrition, exercise, diet, and healthy lifestyle.Marland Kitchen  Refer to GI for colonoscopy.  Orders Placed This Encounter  Procedures  . MM DIGITAL SCREENING BILATERAL  . CBC with Differential  . Comprehensive metabolic panel  . Lipid panel  . TSH  . Ambulatory referral to Gastroenterology

## 2013-11-30 NOTE — Assessment & Plan Note (Signed)
Wean down dose of prempro. eRx sent.

## 2013-11-30 NOTE — Assessment & Plan Note (Signed)
Pap smear today. Mammogram ordered. 

## 2013-11-30 NOTE — Progress Notes (Signed)
Pre visit review using our clinic review tool, if applicable. No additional management support is needed unless otherwise documented below in the visit note. 

## 2013-11-30 NOTE — Patient Instructions (Addendum)
Good to see you.  We will call you with your lab results.  Please stop by to see Rosaria Ferries on your way out to set up your mammogram and colonoscopy appointment.  We are decreasing your Prempro to lower dose- call me if you have any issues.

## 2013-11-30 NOTE — Progress Notes (Signed)
63 yo pleasant female here for CPX.  Menopausal since 2004, has not had a hysterectomy.  Has been on Prempro for years, has not been able to wean off in past.  Last pap smear was normal, 05/17/12- done by me. No h/o abnormal pap smears since 2009.   Zostavax 03/2010  Mammogram 05/30/12 ? Due for colonoscopy- results not in chart. Does have h/o IBS- having more loose stools and diarrhea.  No blood in stool.  No abdominal pain.  No n/v/d.   Seizure disorder- had one seizure on 02/27/08--sees Dr Doy Mince, neurologist.  Sees them every three months, checks her levels and LFTs.  Currently taking 400 mg of Lamictal because was still having auras on lower dose.  Patient Active Problem List   Diagnosis Date Noted  . Elevated blood pressure reading without diagnosis of hypertension 06/20/2013  . Localization-related (focal) (partial) epilepsy and epileptic syndromes with complex partial seizures, without mention of intractable epilepsy 05/09/2013  . Routine general medical examination at a health care facility 05/17/2012  . Iron deficiency anemia, unspecified 01/23/2011  . HEADACHE 05/16/2010  . MAMMOGRAM, ABNORMAL 02/06/2010  . SEIZURE DISORDER 02/25/2008  . DIVERTICULOSIS, COLON 12/27/2007  . STATE, SYMPTOMATIC MENOPAUSE/FEM CLIMACTERIC 12/27/2007  . SPONDYLOSIS, LUMBAR 12/27/2007  . OSTEOPENIA 12/27/2007  . BREAST IMPLANTS, BILATERAL, HX OF 12/27/2007   Past Medical History  Diagnosis Date  . Diverticulosis   . Osteopenia 12/21/2007  . Seizures    Past Surgical History  Procedure Laterality Date  . Vaginal delivery      x 1  . Breast surgery  1983    bilateral implants  . Cervical disease  1999    H/O Cryo  . Lasik  2007  . Colonoscopy  05/01/2005  . Foot surgery Bilateral    History  Substance Use Topics  . Smoking status: Never Smoker   . Smokeless tobacco: Never Used  . Alcohol Use: No   Family History  Problem Relation Age of Onset  . Cancer Mother     skin    Allergies  Allergen Reactions  . Shellfish Allergy Swelling and Rash  . Hydrocodone-Homatropine Hives and Itching  . Penicillins     REACTION: rash   Current Outpatient Prescriptions on File Prior to Visit  Medication Sig Dispense Refill  . aspirin 81 MG tablet Take 81 mg by mouth daily.        . Calcium Carbonate-Vitamin D (TH CALCIUM CARBONATE-VITAMIN D) 600-400 MG-UNIT per tablet Take 1 tablet by mouth.       Marland Kitchen LAMICTAL XR 100 MG TB24 Take 4 tablets (400 mg total) by mouth at bedtime.  360 tablet  3  . Multiple Vitamin (MULTIVITAMIN) tablet Take 1 tablet by mouth daily.        Marland Kitchen PREMPRO 0.45-1.5 MG per tablet TAKE 1 TABLET BY MOUTH EVERY DAY  90 tablet  0   No current facility-administered medications on file prior to visit.    The PMH, PSH, Social History, Family History, Medications, and allergies have been reviewed in [facility], and have been updated if relevant.    Review of Systems       See HPI Patient reports no  vision/ hearing changes,anorexia, weight change, fever ,adenopathy, persistant / recurrent hoarseness, swallowing issues, chest pain, edema,persistant / recurrent cough, hemoptysis, dyspnea(rest, exertional, paroxysmal nocturnal), gastrointestinal  bleeding (melena, rectal bleeding), abdominal pain, excessive heart burn, GU symptoms(dysuria, hematuria, pyuria, voiding/incontinence  Issues) syncope, focal weakness, severe memory loss, concerning skin lesions, depression, anxiety, abnormal bruising/bleeding,  major joint swelling, breast masses or abnormal vaginal bleeding.     Physical Exam BP 138/82  Pulse 64  Temp(Src) 97.9 F (36.6 C) (Oral)  Ht 5' 6.25" (1.683 m)  Wt 174 lb 12 oz (79.266 kg)  BMI 27.98 kg/m2  SpO2 98%  General:  Well-developed,well-nourished,in no acute distress; alert,appropriate and cooperative throughout examination Head:  normocephalic and atraumatic.   Eyes:  vision grossly intact, pupils equal, pupils round, and pupils reactive to  light.   Ears:  R ear normal and L ear normal.   Nose:  no external deformity.   Mouth:  good dentition.   Neck:  No deformities, masses, or tenderness noted. Breasts:  No mass, nodules, thickening, tenderness, bulging, retraction, inflamation, nipple discharge or skin changes noted.   Lungs:  Normal respiratory effort, chest expands symmetrically. Lungs are clear to auscultation, no crackles or wheezes. Heart:  Normal rate and regular rhythm. S1 and S2 normal without gallop, murmur, click, rub or other extra sounds. Abdomen:  Bowel sounds positive,abdomen soft and non-tender without masses, organomegaly or hernias noted. Rectal:  no external abnormalities.   Genitalia:  Pelvic Exam:        External: normal female genitalia without lesions or masses        Vagina: normal without lesions or masses        Cervix: normal without lesions or masses        Adnexa: normal bimanual exam without masses or fullness        Uterus: normal by palpation        Pap smear: performed Msk:  No deformity or scoliosis noted of thoracic or lumbar spine.   Extremities:  No clubbing, cyanosis, edema, or deformity noted with normal full range of motion of all joints.   Neurologic:  alert & oriented X3 and gait normal.   Skin:  Intact without suspicious lesions or rashes Cervical Nodes:  No lymphadenopathy noted Axillary Nodes:  No palpable lymphadenopathy Psych:  Cognition and judgment appear intact. Alert and cooperative with normal attention span and concentration. No apparent delusions, illusions, hallucinations   Assessment and Plan:

## 2013-12-04 ENCOUNTER — Encounter: Payer: Self-pay | Admitting: *Deleted

## 2013-12-12 ENCOUNTER — Ambulatory Visit: Payer: Self-pay | Admitting: Family Medicine

## 2013-12-13 ENCOUNTER — Encounter: Payer: Self-pay | Admitting: Family Medicine

## 2014-01-29 ENCOUNTER — Ambulatory Visit: Payer: Managed Care, Other (non HMO) | Admitting: Gastroenterology

## 2014-04-05 ENCOUNTER — Ambulatory Visit (INDEPENDENT_AMBULATORY_CARE_PROVIDER_SITE_OTHER): Payer: Managed Care, Other (non HMO) | Admitting: Family Medicine

## 2014-04-05 ENCOUNTER — Encounter: Payer: Self-pay | Admitting: Family Medicine

## 2014-04-05 VITALS — BP 138/80 | HR 90 | Temp 97.1°F | Wt 176.0 lb

## 2014-04-05 DIAGNOSIS — Z23 Encounter for immunization: Secondary | ICD-10-CM

## 2014-04-05 DIAGNOSIS — J069 Acute upper respiratory infection, unspecified: Secondary | ICD-10-CM

## 2014-04-05 MED ORDER — AZITHROMYCIN 250 MG PO TABS: ORAL_TABLET | ORAL | Status: DC

## 2014-04-05 MED ORDER — PROMETHAZINE-DM 6.25-15 MG/5ML PO SYRP: 5.0000 mL | ORAL_SOLUTION | Freq: Four times a day (QID) | ORAL | Status: DC | PRN

## 2014-04-05 MED ORDER — ALBUTEROL SULFATE HFA 108 (90 BASE) MCG/ACT IN AERS: 2.0000 | INHALATION_SPRAY | Freq: Four times a day (QID) | RESPIRATORY_TRACT | Status: DC | PRN

## 2014-04-05 NOTE — Progress Notes (Signed)
Pre visit review using our clinic review tool, if applicable. No additional management support is needed unless otherwise documented below in the visit note. 

## 2014-04-05 NOTE — Addendum Note (Signed)
Addended by: Despina Hidden on: 04/05/2014 10:41 AM   Modules accepted: Orders

## 2014-04-05 NOTE — Progress Notes (Signed)
SUBJECTIVE:  Lisa Yu is a 63 y.o. female who complains of productive cough for 14 days. She denies a history of anorexia, chest pain, chills, fatigue, fevers, sweats, vomiting and weakness and denies a history of asthma. Patient denies smoke cigarettes.   Current Outpatient Prescriptions on File Prior to Visit  Medication Sig Dispense Refill  . aspirin 81 MG tablet Take 81 mg by mouth daily.        . Calcium Carbonate-Vitamin D (TH CALCIUM CARBONATE-VITAMIN D) 600-400 MG-UNIT per tablet Take 1 tablet by mouth.       . estrogen, conjugated,-medroxyprogesterone (PREMPRO) 0.3-1.5 MG per tablet Take 1 tablet by mouth daily.  30 tablet  3  . fluticasone (FLONASE) 50 MCG/ACT nasal spray Place 2 sprays into the nose daily as needed.      Marland Kitchen LAMICTAL XR 100 MG TB24 Take 4 tablets (400 mg total) by mouth at bedtime.  360 tablet  3  . Multiple Vitamin (MULTIVITAMIN) tablet Take 1 tablet by mouth daily.         No current facility-administered medications on file prior to visit.    Allergies  Allergen Reactions  . Shellfish Allergy Swelling and Rash  . Hydrocodone-Homatropine Hives and Itching  . Penicillins     REACTION: rash    Past Medical History  Diagnosis Date  . Diverticulosis   . Osteopenia 12/21/2007  . Seizures     Past Surgical History  Procedure Laterality Date  . Vaginal delivery      x 1  . Breast surgery  1983    bilateral implants  . Cervical disease  1999    H/O Cryo  . Lasik  2007  . Colonoscopy  05/01/2005  . Foot surgery Bilateral     Family History  Problem Relation Age of Onset  . Cancer Mother     skin    History   Social History  . Marital Status: Married    Spouse Name: N/A    Number of Children: 1  . Years of Education: N/A   Occupational History  . Retired from [employer] Topics  . Smoking status: Never Smoker   . Smokeless tobacco: Never Used  . Alcohol Use: No  . Drug Use: No  . Sexual Activity: Not on  file   Other Topics Concern  . Not on file   Social History Narrative  . No narrative on file   The PMH, PSH, Social History, Family History, Medications, and allergies have been reviewed in [facility], and have been updated if relevant.  OBJECTIVE: BP 138/80  Pulse 90  Temp(Src) 97.1 F (36.2 C) (Tympanic)  Wt 176 lb (79.833 kg)  SpO2 95%  She appears well, vital signs are as noted. Ears normal.  Throat and pharynx normal.  Neck supple. No adenopathy in the neck. Nose is congested. Sinuses non tender.  Scattered exp wheezes, right great then left Occasional ronchi   ASSESSMENT:  bronchitis  PLAN: Given duration and progression of symptoms, will treat for bacterial process with zpack. proair inhaler as needed, as well as prn cough suppressants- states she can take promethazine DM without allergic rxn.  Symptomatic therapy suggested: push fluids, rest and return office visit prn if symptoms persist or worsen.  Call or return to clinic prn if these symptoms worsen or fail to improve as anticipated.

## 2014-04-05 NOTE — Patient Instructions (Signed)
Good to see you. Take Zpack as directed. Use proair as needed for cough or wheezing.  Acute Bronchitis Bronchitis is inflammation of the airways that extend from the windpipe into the lungs (bronchi). The inflammation often causes mucus to develop. This leads to a cough, which is the most common symptom of bronchitis.  In acute bronchitis, the condition usually develops suddenly and goes away over time, usually in a couple weeks. Smoking, allergies, and asthma can make bronchitis worse. Repeated episodes of bronchitis may cause further lung problems.  CAUSES Acute bronchitis is most often caused by the same virus that causes a cold. The virus can spread from person to person (contagious) through coughing, sneezing, and touching contaminated objects. SIGNS AND SYMPTOMS   Cough.   Fever.   Coughing up mucus.   Body aches.   Chest congestion.   Chills.   Shortness of breath.   Sore throat.  DIAGNOSIS  Acute bronchitis is usually diagnosed through a physical exam. Your health care provider will also ask you questions about your medical history.   TREATMENT  Acute bronchitis usually goes away in a couple weeks. Oftentimes, no medical treatment is necessary. Medicines are sometimes given for relief of fever or cough. Antibiotic medicines are usually not needed but may be prescribed in certain situations. In some cases, an inhaler may be recommended to help reduce shortness of breath and control the cough. A cool mist vaporizer may also be used to help thin bronchial secretions and make it easier to clear the chest.  HOME CARE INSTRUCTIONS  Get plenty of rest.   Drink enough fluids to keep your urine clear or pale yellow (unless you have a medical condition that requires fluid restriction). Increasing fluids may help thin your respiratory secretions (sputum) and reduce chest congestion, and it will prevent dehydration.   Take medicines only as directed by your health care  provider.  If you were prescribed an antibiotic medicine, finish it all even if you start to feel better.  Avoid smoking and secondhand smoke. Exposure to cigarette smoke or irritating chemicals will make bronchitis worse. If you are a smoker, consider using nicotine gum or skin patches to help control withdrawal symptoms. Quitting smoking will help your lungs heal faster.   Reduce the chances of another bout of acute bronchitis by washing your hands frequently, avoiding people with cold symptoms, and trying not to touch your hands to your mouth, nose, or eyes.   Keep all follow-up visits as directed by your health care provider.  SEEK MEDICAL CARE IF: Your symptoms do not improve after 1 week of treatment.  SEEK IMMEDIATE MEDICAL CARE IF:  You develop an increased fever or chills.   You have chest pain.   You have severe shortness of breath.  You have bloody sputum.   You develop dehydration.  You faint or repeatedly feel like you are going to pass out.  You develop repeated vomiting.  You develop a severe headache. MAKE SURE YOU:   Understand these instructions.  Will watch your condition.  Will get help right away if you are not doing well or get worse. Document Released: 08/20/2004 Document Revised: 11/27/2013 Document Reviewed: 01/03/2013 [facility] Patient Information 2015 [place], [place]. This information is not intended to replace advice given to you by your health care provider. Make sure you discuss any questions you have with your health care provider.

## 2014-04-09 ENCOUNTER — Telehealth: Payer: Self-pay | Admitting: Family Medicine

## 2014-04-09 NOTE — Telephone Encounter (Signed)
Continue supportive care. Zpack is still in her system- give it a little more time. Update [place] towards the end of the week if not better.

## 2014-04-09 NOTE — Telephone Encounter (Signed)
Pt came in last Thursday to see dr Deborra Medina.  She stated she has taken all her zpak  And stated she is still has congestion in her chest and coughing up green stuff.  She wanted to know what she needed to do  She is going out of town Saturday.  She wanted to know if she needed another zpak or something else to help her  [facility]

## 2014-04-09 NOTE — Telephone Encounter (Signed)
Spoke to pt and advised per Dr Deborra Medina; pt verbally expressed understanding. States that she will contact office on Friday, 9/18 before going out of town.

## 2014-04-12 ENCOUNTER — Telehealth: Payer: Self-pay | Admitting: Family Medicine

## 2014-04-12 NOTE — Telephone Encounter (Signed)
It can take some time for symptoms to resolve completely.  Continue supportive care with inhaler and cough suppressant.  If symptoms persist into next week, please call us back.

## 2014-04-12 NOTE — Telephone Encounter (Signed)
Pt called and after completing antibiotic on Monday, is still having a cough with green phlegm all day even though it is productive.  Continuing to cough at night without much relief.  Pt states she is having some wheezing.  Pt declined appointment, was instructed to call back today if not better after completing medication.  Best number to call pt is [phone].  Pharmacy of choice is [facility]

## 2014-04-12 NOTE — Telephone Encounter (Signed)
Spoke to pt and advised per Dr Aron; pt verbally expressed understanding.  

## 2014-04-13 ENCOUNTER — Encounter: Payer: Self-pay | Admitting: Family Medicine

## 2014-04-13 ENCOUNTER — Ambulatory Visit (INDEPENDENT_AMBULATORY_CARE_PROVIDER_SITE_OTHER): Payer: Managed Care, Other (non HMO) | Admitting: Family Medicine

## 2014-04-13 VITALS — BP 150/82 | HR 65 | Temp 98.0°F | Wt 175.2 lb

## 2014-04-13 DIAGNOSIS — J069 Acute upper respiratory infection, unspecified: Secondary | ICD-10-CM

## 2014-04-13 MED ORDER — ALBUTEROL SULFATE HFA 108 (90 BASE) MCG/ACT IN AERS: 2.0000 | INHALATION_SPRAY | Freq: Four times a day (QID) | RESPIRATORY_TRACT | Status: DC | PRN

## 2014-04-13 MED ORDER — LEVOFLOXACIN 500 MG PO TABS: 500.0000 mg | ORAL_TABLET | Freq: Every day | ORAL | Status: DC

## 2014-04-13 NOTE — Progress Notes (Signed)
Pre visit review using our clinic review tool, if applicable. No additional management support is needed unless otherwise documented below in the visit note. 

## 2014-04-13 NOTE — Progress Notes (Signed)
SUBJECTIVE:  Lisa Yu is a 63 y.o. female here for follow up.  I saw her on 9/10 for 2 weeks of productive cough. Had diffuses wheezes on exam with productive cough. Given rx for Zpack [facility] allergic) and sent in rx for cough suppressant and prn albuterol.  Here today because she feels her cough is progressive. "Zpack usually works."   Current Outpatient Prescriptions on File Prior to Visit  Medication Sig Dispense Refill  . aspirin 81 MG tablet Take 81 mg by mouth daily.        . Calcium Carbonate-Vitamin D (TH CALCIUM CARBONATE-VITAMIN D) 600-400 MG-UNIT per tablet Take 1 tablet by mouth.       . estrogen, conjugated,-medroxyprogesterone (PREMPRO) 0.3-1.5 MG per tablet Take 1 tablet by mouth daily.  30 tablet  3  . fluticasone (FLONASE) 50 MCG/ACT nasal spray Place 2 sprays into the nose daily as needed.      Marland Kitchen LAMICTAL XR 100 MG TB24 Take 4 tablets (400 mg total) by mouth at bedtime.  360 tablet  3  . Multiple Vitamin (MULTIVITAMIN) tablet Take 1 tablet by mouth daily.        . promethazine-dextromethorphan (PROMETHAZINE-DM) 6.25-15 MG/5ML syrup Take 5 mLs by mouth 4 (four) times daily as needed for cough.  118 mL  0   No current facility-administered medications on file prior to visit.    Allergies  Allergen Reactions  . Shellfish Allergy Swelling and Rash  . Hydrocodone-Homatropine Hives and Itching  . Penicillins     REACTION: rash    Past Medical History  Diagnosis Date  . Diverticulosis   . Osteopenia 12/21/2007  . Seizures     Past Surgical History  Procedure Laterality Date  . Vaginal delivery      x 1  . Breast surgery  1983    bilateral implants  . Cervical disease  1999    H/O Cryo  . Lasik  2007  . Colonoscopy  05/01/2005  . Foot surgery Bilateral     Family History  Problem Relation Age of Onset  . Cancer Mother     skin    History   Social History  . Marital Status: Married    Spouse Name: N/A    Number of Children: 1  . Years of  Education: N/A   Occupational History  . Retired from [employer] Topics  . Smoking status: Never Smoker   . Smokeless tobacco: Never Used  . Alcohol Use: No  . Drug Use: No  . Sexual Activity: Not on file   Other Topics Concern  . Not on file   Social History Narrative  . No narrative on file   The PMH, PSH, Social History, Family History, Medications, and allergies have been reviewed in [facility], and have been updated if relevant.  ROS: See HPI No fevers No CP +SOB when she is coughing only +productive cough  OBJECTIVE: BP 150/82  Pulse 65  Temp(Src) 98 F (36.7 C) (Oral)  Wt 175 lb 4 oz (79.493 kg)  SpO2 99%  She appears well, vital signs are as noted. Ears normal.  Throat and pharynx normal.  Neck supple. No adenopathy in the neck. Nose is congested. Sinuses non tender.  Persistent  exp wheezes, right great then left Rhonchi RLL    ASSESSMENT:  Bronchitis/ ? CAP  PLAN: Given duration and progression of symptoms, will treat with 7 day course of avelox. Proair refilled. Call or return  to clinic prn if these symptoms worsen or fail to improve as anticipated. The patient indicates understanding of these issues and agrees with the plan.

## 2014-04-13 NOTE — Patient Instructions (Signed)
Good to see you. Take avelox as directed. Call me with an update.

## 2014-05-10 ENCOUNTER — Ambulatory Visit (INDEPENDENT_AMBULATORY_CARE_PROVIDER_SITE_OTHER): Payer: 59 | Admitting: Neurology

## 2014-05-10 ENCOUNTER — Ambulatory Visit: Payer: Self-pay | Admitting: Neurology

## 2014-05-10 ENCOUNTER — Encounter: Payer: Self-pay | Admitting: Neurology

## 2014-05-10 VITALS — BP 137/74 | HR 64 | Ht 67.5 in | Wt 179.0 lb

## 2014-05-10 DIAGNOSIS — G40209 Localization-related (focal) (partial) symptomatic epilepsy and epileptic syndromes with complex partial seizures, not intractable, without status epilepticus: Secondary | ICD-10-CM

## 2014-05-10 DIAGNOSIS — R5601 Complex febrile convulsions: Secondary | ICD-10-CM

## 2014-05-10 MED ORDER — LAMOTRIGINE ER 100 MG PO TB24: 400.0000 mg | ORAL_TABLET | Freq: Every day | ORAL | Status: DC

## 2014-05-10 NOTE — Progress Notes (Signed)
[name] DOB: 1951-04-27  HISTORY OF PRESENT ILLNESS:[name], is a 63 year-old Caucasian female with history of complex partial seizures with secondary generalization. Last clinical visit was October 2014 with nurse practitioner Cecille Rubin,   She is taking and tolerating brand-name Lamictal XR 100 mg fourth tablets every night and without side effect. Her last seizure occurred in July, 2010, on a day of traveling. She says that she was tired and experienced a funny feeling like when she had seizures in the past, but no overt seizure activity. She has had no further auras or seizures. She is retired and keeps her grandchildren. No new neurological complaints.   UPDATE Oct 15th 2015: She has has no recurrent seizure, is taking brand name Lamictal xr 100mg  4 tabs qhs.  She is worried about the medication cost, . Is open to other options. She still drives.   REVIEW OF SYSTEMS: Full 14 system review of systems performed and notable only for: As above    ALLERGIES: Allergies  Allergen Reactions  . Shellfish Allergy Swelling and Rash  . Hydrocodone-Homatropine Hives and Itching  . Penicillins     REACTION: rash    HOME MEDICATIONS: Outpatient Prescriptions Prior to Visit  Medication Sig Dispense Refill  . albuterol (PROVENTIL HFA;VENTOLIN HFA) 108 (90 BASE) MCG/ACT inhaler Inhale 2 puffs into the lungs every 6 (six) hours as needed.  1 Inhaler  0  . aspirin 81 MG tablet Take 81 mg by mouth daily.        . Calcium Carbonate-Vitamin D (TH CALCIUM CARBONATE-VITAMIN D) 600-400 MG-UNIT per tablet Take 1 tablet by mouth.       . estrogen, conjugated,-medroxyprogesterone (PREMPRO) 0.3-1.5 MG per tablet Take 1 tablet by mouth daily.  30 tablet  3  . fluticasone (FLONASE) 50 MCG/ACT nasal spray Place 2 sprays into the nose daily as needed.      Marland Kitchen LAMICTAL XR 100 MG TB24 Take 4 tablets (400 mg total) by mouth at bedtime.  360 tablet  3  .  Multiple Vitamin (MULTIVITAMIN) tablet Take 1 tablet by mouth daily.        . promethazine-dextromethorphan (PROMETHAZINE-DM) 6.25-15 MG/5ML syrup Take 5 mLs by mouth 4 (four) times daily as needed for cough.  118 mL  0  . levofloxacin (LEVAQUIN) 500 MG tablet Take 1 tablet (500 mg total) by mouth daily.  7 tablet  0   No facility-administered medications prior to visit.    PAST MEDICAL HISTORY: Past Medical History  Diagnosis Date  . Diverticulosis   . Osteopenia 12/21/2007  . Seizures     PAST SURGICAL HISTORY: Past Surgical History  Procedure Laterality Date  . Vaginal delivery      x 1  . Breast surgery  1983    bilateral implants  . Cervical disease  1999    H/O Cryo  . Lasik  2007  . Colonoscopy  05/01/2005  . Foot surgery Bilateral     FAMILY HISTORY: Family History  Problem Relation Age of Onset  . Cancer Mother     skin    SOCIAL HISTORY: History   Social History  . Marital Status: Married    Spouse Name: N/A    Number of Children: 1  . Years of Education: High school diploma   Occupational History  . Retired from [employer] Topics  . Smoking status: Never Smoker   . Smokeless tobacco: Never Used  .  Alcohol Use: No  . Drug Use: No  . Sexual Activity: Not on file   Other Topics Concern  . Not on file   Social History Narrative  . No narrative on file     PHYSICAL EXAM  Filed Vitals:   05/10/14 0816  BP: 137/74  Pulse: 64  Height: 5' 7.5" (1.715 m)  Weight: 179 lb (81.194 kg)   Body mass index is 27.61 kg/(m^2).  Generalized: Well developed, in no acute distress  Head: normocephalic and atraumatic,. Oropharynx benign  Neck: Supple, no carotid bruits  Cardiac: Regular rate rhythm, no murmur  Musculoskeletal: No deformity   Neurological examination   Mentation: Alert oriented to time, place, history taking. Follows all commands speech and language fluent  Cranial nerve II-XII: .Pupils were equal round  reactive to light extraocular movements were full, visual field were full on confrontational test. Facial sensation and strength were normal. hearing was intact to finger rubbing bilaterally. Uvula tongue midline. head turning and shoulder shrug and were normal and symmetric.Tongue protrusion into cheek strength was normal. Motor: normal bulk and tone, full strength in the BUE, BLE, fine finger movements normal, no pronator drift. No focal weakness Sensory: normal and symmetric to light touch, pinprick, and  vibration  Coordination: finger-nose-finger, heel-to-shin bilaterally, no dysmetria Reflexes: 1+ upper, lower and symmetric Gait and Station: Rising up from seated position without assistance, normal stance, moderate stride, good arm swing, smooth turning, able to perform tiptoe, and heel walking without difficulty. Tandem steady.   DIAGNOSTIC DATA (LABS, IMAGING, TESTING) - I reviewed patient records, labs, notes, testing and imaging myself where available.  Lab Results  Component Value Date   WBC 5.8 11/30/2013   HGB 13.8 11/30/2013   HCT 41.5 11/30/2013   MCV 90.5 11/30/2013   PLT 314.0 11/30/2013      Component Value Date/Time   NA 139 11/30/2013 0924   K 4.2 11/30/2013 0924   CL 106 11/30/2013 0924   CO2 28 11/30/2013 0924   GLUCOSE 90 11/30/2013 0924   BUN 18 11/30/2013 0924   CREATININE 0.7 11/30/2013 0924   CALCIUM 9.2 11/30/2013 0924   PROT 7.0 11/30/2013 0924   ALBUMIN 4.3 11/30/2013 0924   AST 23 11/30/2013 0924   ALT 23 11/30/2013 0924   ALKPHOS 49 11/30/2013 0924   BILITOT 1.0 11/30/2013 0924   GFRNONAA 87.85 12/31/2009 0953   GFRAA 111 12/21/2007 0816   Lab Results  Component Value Date   CHOL 185 11/30/2013   HDL 61.00 11/30/2013   LDLCALC 115* 11/30/2013   LDLDIRECT 139.7 01/29/2011   TRIG 47.0 11/30/2013   CHOLHDL 3 11/30/2013    ASSESSMENT AND PLAN  63 y.o. year old female  has a past medical history of Diverticulosis; Osteopenia (12/21/2007); and Seizures. here for followup. Last seizure 2010. She  is on brand-name Lamictal xr 100 mg 4 tablets every night  1, I will write her generic lamotrigine xr 100 mg 4 tablets every night 2, check lamotrigine level today, call her result 3., return to clinic in 3 months with [facility] [address], [place], [place] [address] [phone]

## 2014-05-11 LAB — LAMOTRIGINE LEVEL: Lamotrigine Lvl: 11.5 ug/mL (ref 2.0–20.0)

## 2014-05-17 NOTE — Progress Notes (Signed)
Quick Note:  Called and spoke to patient told her normal labs. Patient understood. ______ 

## 2014-06-04 ENCOUNTER — Telehealth: Payer: Self-pay | Admitting: Neurology

## 2014-06-04 NOTE — Telephone Encounter (Signed)
ICD 10 40.209

## 2014-06-05 ENCOUNTER — Other Ambulatory Visit: Payer: Self-pay | Admitting: *Deleted

## 2014-06-05 MED ORDER — CONJ ESTROG-MEDROXYPROGEST ACE 0.3-1.5 MG PO TABS: 1.0000 | ORAL_TABLET | Freq: Every day | ORAL | Status: DC

## 2014-06-07 ENCOUNTER — Other Ambulatory Visit: Payer: Self-pay

## 2014-06-07 MED ORDER — CONJ ESTROG-MEDROXYPROGEST ACE 0.3-1.5 MG PO TABS: 1.0000 | ORAL_TABLET | Freq: Every day | ORAL | Status: DC

## 2014-06-07 NOTE — Telephone Encounter (Signed)
Pt request 90 day rx for prempro to [facility] because less expensive than getting monthly. Spoke with Marcello Moores at [facility] and cancelled 30 day rx. With 90 day replacement. Pt notified.

## 2014-08-14 ENCOUNTER — Ambulatory Visit (INDEPENDENT_AMBULATORY_CARE_PROVIDER_SITE_OTHER): Payer: 59 | Admitting: Neurology

## 2014-08-14 ENCOUNTER — Ambulatory Visit: Payer: 59 | Admitting: Nurse Practitioner

## 2014-08-14 ENCOUNTER — Encounter: Payer: Self-pay | Admitting: Neurology

## 2014-08-14 VITALS — BP 142/79 | HR 70 | Ht 67.5 in | Wt 179.0 lb

## 2014-08-14 DIAGNOSIS — R5601 Complex febrile convulsions: Secondary | ICD-10-CM

## 2014-08-14 NOTE — Progress Notes (Signed)
[name] DOB: Nov 06, 1950  HISTORY OF PRESENT ILLNESS:Lisa Yu, is a 64 year-old Caucasian female with history of complex partial seizures with secondary generalization. Last clinical visit was October 2014 with nurse practitioner Cecille Rubin,   She is taking and tolerating brand-name Lamictal XR 100 mg fourth tablets every night and without side effect. Her last seizure occurred in July, 2010, on a day of traveling. She says that she was tired and experienced a funny feeling like when she had seizures in the past, but no overt seizure activity. She has had no further auras or seizures. She is retired and keeps her grandchildren. No new neurological complaints.   UPDATE Oct 15th 2015: She has has no recurrent seizure, is taking brand name Lamictal xr 100mg  4 tabs qhs.  She is worried about the medication cost, open to option of generic, She still drives.  UPDATE Jan 19th 2016: Left parietal area intermittent transient sharp pain, lasting for few second, mild headache afterwards, she tolerating generic lamotrigine xr 100mg  4 tabs well.  Level was 11.8.   REVIEW OF SYSTEMS: Full 14 system review of systems performed and notable only for:  as above    ALLERGIES: Allergies  Allergen Reactions  . Shellfish Allergy Swelling and Rash  . Hydrocodone-Homatropine Hives and Itching  . Penicillins     REACTION: rash    HOME MEDICATIONS: Outpatient Prescriptions Prior to Visit  Medication Sig Dispense Refill  . albuterol (PROVENTIL HFA;VENTOLIN HFA) 108 (90 BASE) MCG/ACT inhaler Inhale 2 puffs into the lungs every 6 (six) hours as needed. 1 Inhaler 0  . aspirin 81 MG tablet Take 81 mg by mouth daily.      . Calcium Carbonate-Vitamin D (TH CALCIUM CARBONATE-VITAMIN D) 600-400 MG-UNIT per tablet Take 1 tablet by mouth.     . estrogen, conjugated,-medroxyprogesterone (PREMPRO) 0.3-1.5 MG per tablet Take 1 tablet by mouth daily. 90 tablet 1  .  fluticasone (FLONASE) 50 MCG/ACT nasal spray Place 2 sprays into the nose daily as needed.    . LamoTRIgine (LAMICTAL XR) 100 MG TB24 Take 4 tablets (400 mg total) by mouth at bedtime. 360 tablet 3  . Multiple Vitamin (MULTIVITAMIN) tablet Take 1 tablet by mouth daily.      . promethazine-dextromethorphan (PROMETHAZINE-DM) 6.25-15 MG/5ML syrup Take 5 mLs by mouth 4 (four) times daily as needed for cough. 118 mL 0   No facility-administered medications prior to visit.    PAST MEDICAL HISTORY: Past Medical History  Diagnosis Date  . Diverticulosis   . Osteopenia 12/21/2007  . Seizures     PAST SURGICAL HISTORY: Past Surgical History  Procedure Laterality Date  . Vaginal delivery      x 1  . Breast surgery  1983    bilateral implants  . Cervical disease  1999    H/O Cryo  . Lasik  2007  . Colonoscopy  05/01/2005  . Foot surgery Bilateral     FAMILY HISTORY: Family History  Problem Relation Age of Onset  . Cancer Mother     skin    SOCIAL HISTORY: History   Social History  . Marital Status: Married    Spouse Name: N/A    Number of Children: 1  . Years of Education: High school diploma   Occupational History  . Retired from [employer] Topics  . Smoking status: Never Smoker   . Smokeless tobacco: Never Used  . Alcohol Use: No  .  Drug Use: No  . Sexual Activity: Not on file   Other Topics Concern  . Not on file   Social History Narrative  . No narrative on file     PHYSICAL EXAM  Filed Vitals:   08/14/14 0812  BP: 142/79  Pulse: 70  Height: 5' 7.5" (1.715 m)  Weight: 179 lb (81.194 kg)   Body mass index is 27.61 kg/(m^2).  Generalized: Well developed, in no acute distress  Head: normocephalic and atraumatic,. Oropharynx benign  Neck: Supple, no carotid bruits  Cardiac: Regular rate rhythm, no murmur  Musculoskeletal: No deformity   Neurological examination   Mentation: Alert oriented to time, place, history taking.  Follows all commands speech and language fluent  Cranial nerve II-XII: .Pupils were equal round reactive to light extraocular movements were full, visual field were full on confrontational test. Facial sensation and strength were normal. hearing was intact to finger rubbing bilaterally. Uvula tongue midline. head turning and shoulder shrug and were normal and symmetric.Tongue protrusion into cheek strength was normal. Motor: normal bulk and tone, full strength in the BUE, BLE, fine finger movements normal, no pronator drift. No focal weakness Sensory: normal and symmetric to light touch, pinprick, and  vibration  Coordination: finger-nose-finger, heel-to-shin bilaterally, no dysmetria Reflexes: 1+ upper, lower and symmetric Gait and Station: Rising up from seated position without assistance, normal stance, moderate stride, good arm swing, smooth turning, able to perform tiptoe, and heel walking without difficulty. Tandem steady.   DIAGNOSTIC DATA (LABS, IMAGING, TESTING) - I reviewed patient records, labs, notes, testing and imaging myself where available.  Lab Results  Component Value Date   WBC 5.8 11/30/2013   HGB 13.8 11/30/2013   HCT 41.5 11/30/2013   MCV 90.5 11/30/2013   PLT 314.0 11/30/2013      Component Value Date/Time   NA 139 11/30/2013 0924   K 4.2 11/30/2013 0924   CL 106 11/30/2013 0924   CO2 28 11/30/2013 0924   GLUCOSE 90 11/30/2013 0924   BUN 18 11/30/2013 0924   CREATININE 0.7 11/30/2013 0924   CALCIUM 9.2 11/30/2013 0924   PROT 7.0 11/30/2013 0924   ALBUMIN 4.3 11/30/2013 0924   AST 23 11/30/2013 0924   ALT 23 11/30/2013 0924   ALKPHOS 49 11/30/2013 0924   BILITOT 1.0 11/30/2013 0924   GFRNONAA 87.85 12/31/2009 0953   GFRAA 111 12/21/2007 0816   Lab Results  Component Value Date   CHOL 185 11/30/2013   HDL 61.00 11/30/2013   LDLCALC 115* 11/30/2013   LDLDIRECT 139.7 01/29/2011   TRIG 47.0 11/30/2013   CHOLHDL 3 11/30/2013    ASSESSMENT AND  PLAN  64 y.o. year old female  has a past medical history of Diverticulosis; Osteopenia (12/21/2007); and Seizures. here for followup. Last seizure 2010. She is on brand-name Lamictal xr 100 mg 4 tablets every night  1, keep  generic lamotrigine xr 100 mg 4 tablets every night, 2. return to clinic in 12 months with [facility] [address], [place], [place] [address] [phone]

## 2015-02-28 ENCOUNTER — Telehealth: Payer: Self-pay | Admitting: Neurology

## 2015-02-28 MED ORDER — LAMOTRIGINE 200 MG PO TABS: 200.0000 mg | ORAL_TABLET | Freq: Two times a day (BID) | ORAL | Status: DC

## 2015-02-28 NOTE — Telephone Encounter (Signed)
Patient called stating LamoTRIgine (LAMICTAL XR) 100 MG TB24  will cost her $80/mth but if directions are changed to 1 every 6 hours the cost will be $6/month. Also she is stating need XR dropped. She has enough to last for tonight and tomorrow night. Please call [facility] in [place] asap.

## 2015-02-28 NOTE — Telephone Encounter (Signed)
Can she be changed to immediate release lamotrigine?

## 2015-02-28 NOTE — Telephone Encounter (Signed)
Patient was taking lamotrigine xr 100 mg 4 tablets every night, I have changed to lamotrigine 200 mg twice a day, new prescription was called into the pharmacy, 60 tablets for one month with 11 refills,  Janett Billow, please let patient  know

## 2015-02-28 NOTE — Telephone Encounter (Signed)
I called the patient.  Relayed providers message.  She expressed understanding and appreciation.

## 2015-05-14 ENCOUNTER — Ambulatory Visit (INDEPENDENT_AMBULATORY_CARE_PROVIDER_SITE_OTHER): Payer: Medicare Other | Admitting: Family Medicine

## 2015-05-14 ENCOUNTER — Encounter: Payer: Self-pay | Admitting: Family Medicine

## 2015-05-14 VITALS — BP 126/82 | HR 70 | Temp 98.7°F | Ht 67.0 in | Wt 180.4 lb

## 2015-05-14 DIAGNOSIS — Z1239 Encounter for other screening for malignant neoplasm of breast: Secondary | ICD-10-CM

## 2015-05-14 DIAGNOSIS — R5601 Complex febrile convulsions: Secondary | ICD-10-CM

## 2015-05-14 DIAGNOSIS — Z0000 Encounter for general adult medical examination without abnormal findings: Secondary | ICD-10-CM

## 2015-05-14 DIAGNOSIS — Z23 Encounter for immunization: Secondary | ICD-10-CM | POA: Diagnosis not present

## 2015-05-14 DIAGNOSIS — Z1211 Encounter for screening for malignant neoplasm of colon: Secondary | ICD-10-CM

## 2015-05-14 DIAGNOSIS — Z79890 Hormone replacement therapy (postmenopausal): Secondary | ICD-10-CM

## 2015-05-14 LAB — COMPREHENSIVE METABOLIC PANEL
ALBUMIN: 4.2 g/dL (ref 3.5–5.2)
ALT: 20 U/L (ref 0–35)
AST: 21 U/L (ref 0–37)
Alkaline Phosphatase: 52 U/L (ref 39–117)
BILIRUBIN TOTAL: 0.5 mg/dL (ref 0.2–1.2)
BUN: 21 mg/dL (ref 6–23)
CHLORIDE: 105 meq/L (ref 96–112)
CO2: 31 mEq/L (ref 19–32)
CREATININE: 0.64 mg/dL (ref 0.40–1.20)
Calcium: 9.7 mg/dL (ref 8.4–10.5)
GFR: 98.91 mL/min (ref >60.00)
Glucose, Bld: 96 mg/dL (ref 70–99)
POTASSIUM: 4.4 meq/L (ref 3.5–5.1)
Sodium: 141 mEq/L (ref 135–145)
TOTAL PROTEIN: 6.9 g/dL (ref 6.0–8.3)

## 2015-05-14 LAB — CBC WITH DIFFERENTIAL/PLATELET
BASOS PCT: 0.7 % (ref 0.0–3.0)
Basophils Absolute: 0 10*3/uL (ref 0.0–0.1)
EOS PCT: 1.7 % (ref 0.0–5.0)
Eosinophils Absolute: 0.1 10*3/uL (ref 0.0–0.7)
HCT: 40.4 % (ref 36.0–46.0)
Hemoglobin: 13.2 g/dL (ref 12.0–15.0)
Lymphocytes Relative: 28.5 % (ref 12.0–46.0)
Lymphs Abs: 2.1 10*3/uL (ref 0.7–4.0)
MCHC: 32.7 g/dL (ref 30.0–36.0)
MCV: 90 fl (ref 78.0–100.0)
Monocytes Absolute: 0.6 10*3/uL (ref 0.1–1.0)
Monocytes Relative: 8.8 % (ref 3.0–12.0)
Neutro Abs: 4.3 10*3/uL (ref 1.4–7.7)
Neutrophils Relative %: 60.3 % (ref 43.0–77.0)
Platelets: 321 10*3/uL (ref 150.0–400.0)
RBC: 4.49 Mil/uL (ref 3.87–5.11)
RDW: 14.7 % (ref 11.5–15.5)
WBC: 7.2 10*3/uL (ref 4.0–10.5)

## 2015-05-14 LAB — LIPID PANEL
CHOL/HDL RATIO: 3
CHOLESTEROL: 186 mg/dL (ref 0–200)
HDL: 67.1 mg/dL (ref >39.00)
LDL Cholesterol: 109 mg/dL — ABNORMAL HIGH (ref 0–99)
NonHDL: 118.68
TRIGLYCERIDES: 48 mg/dL (ref 0.0–149.0)
VLDL: 9.6 mg/dL (ref 0.0–40.0)

## 2015-05-14 LAB — TSH: TSH: 2.5 u[IU]/mL (ref 0.35–4.50)

## 2015-05-14 NOTE — Assessment & Plan Note (Addendum)
The patients weight, height, BMI and visual acuity have been recorded in the chart.  Cognitive function assessed.   I have made referrals, counseling and provided education to the patient based review of the above and I have provided the pt with a written personalized care plan for preventive services.  EKG reassuring- NSR.  Labs today. Mammogram ordered. Orders Placed This Encounter  Procedures  . Fecal occult blood, imunochemical  . CBC with Differential/Platelet  . Comprehensive metabolic panel  . Lipid panel  . TSH  . Hepatitis C Antibody  . HIV antibody (with reflex)

## 2015-05-14 NOTE — Addendum Note (Signed)
Addended by: Lucille Passy on: 05/14/2015 08:14 AM   Modules accepted: Miquel Dunn

## 2015-05-14 NOTE — Addendum Note (Signed)
Addended byCloyd Stagers B on: 05/14/2015 08:52 AM   Modules accepted: Miquel Dunn

## 2015-05-14 NOTE — Patient Instructions (Addendum)
Great to see you.  We will call you with your lab results and you can view the online. Please stop by to see Rosaria Ferries on your way out.

## 2015-05-14 NOTE — Addendum Note (Signed)
Addended by: Lucille Passy on: 05/14/2015 08:46 AM   Modules accepted: Miquel Dunn

## 2015-05-14 NOTE — Progress Notes (Signed)
64 yo pleasant female here for welcome to medicare visit and follow up of chronic medical conditions.  I have personally reviewed the Medicare Annual Wellness questionnaire and have noted 1. The patient's medical and social history 2. Their use of alcohol, tobacco or illicit drugs 3. Their current medications and supplements 4. The patient's functional ability including ADL's, fall risks, home safety risks and hearing or visual             impairment. 5. Diet and physical activities 6. Evidence for depression or mood disorders  End of life wishes discussed and updated in Social History.  The roster of all physicians providing medical care to patient - is listed in the CareTeams section of the chart.   Menopausal since 2004, has not had a hysterectomy.  Has been on Prempro for years, has not been able to wean off in past.  She is planning on weaning off this now due to cost.   Last pap smear was normal, 12/04/13- done by me. No h/o abnormal pap smears since 2009.   Zostavax 03/2010  Mammogram 12/12/13 ? Due for colonoscopy- results not in chart.  Neg IFOB 09/26/12 No changes in bowel habits.  No blood in stool.  No abdominal pain.  No n/v/d.   Seizure disorder- had one seizure on 02/27/08--sees Dr. Krista Blue, neurologist. Was last seen in 08/14/14.  Note reviewed. Currently taking 400 mg of Lamictal because was still having auras on lower dose.  Advised follow up in 1 year.  Lab Results  Component Value Date   CHOL 185 11/30/2013   HDL 61.00 11/30/2013   LDLCALC 115* 11/30/2013   LDLDIRECT 139.7 01/29/2011   TRIG 47.0 11/30/2013   CHOLHDL 3 11/30/2013   Lab Results  Component Value Date   WBC 5.8 11/30/2013   HGB 13.8 11/30/2013   HCT 41.5 11/30/2013   MCV 90.5 11/30/2013   PLT 314.0 11/30/2013   Lab Results  Component Value Date   NA 139 11/30/2013   K 4.2 11/30/2013   CL 106 11/30/2013   CO2 28 11/30/2013   Lab Results  Component Value Date   CREATININE 0.7 11/30/2013      Patient Active Problem List   Diagnosis Date Noted  . Welcome to Medicare preventive visit 05/14/2015  . Complex partial seizure ([place]) 05/10/2014  . IBS (irritable bowel syndrome) 11/30/2013  . Postmenopausal HRT (hormone replacement therapy) 11/30/2013  . Encounter for routine gynecological examination 11/30/2013  . Elevated blood pressure reading without diagnosis of hypertension 06/20/2013  . Localization-related (focal) (partial) epilepsy and epileptic syndromes with complex partial seizures, without mention of intractable epilepsy 05/09/2013  . Iron deficiency anemia, unspecified 01/23/2011  . HEADACHE 05/16/2010  . MAMMOGRAM, ABNORMAL 02/06/2010  . Convulsions ([place]) 02/25/2008  . DIVERTICULOSIS, COLON 12/27/2007  . STATE, SYMPTOMATIC MENOPAUSE/FEM CLIMACTERIC 12/27/2007  . SPONDYLOSIS, LUMBAR 12/27/2007  . OSTEOPENIA 12/27/2007  . BREAST IMPLANTS, BILATERAL, HX OF 12/27/2007   Past Medical History  Diagnosis Date  . Diverticulosis   . Osteopenia 12/21/2007  . Seizures [facility])    Past Surgical History  Procedure Laterality Date  . Vaginal delivery      x 1  . Breast surgery  1983    bilateral implants  . Cervical disease  1999    H/O Cryo  . Lasik  2007  . Colonoscopy  05/01/2005  . Foot surgery Bilateral    Social History  Substance Use Topics  . Smoking status: Never Smoker   . Smokeless tobacco: Never  Used  . Alcohol Use: No   Family History  Problem Relation Age of Onset  . Cancer Mother     skin   Allergies  Allergen Reactions  . Shellfish Allergy Swelling and Rash  . Hydrocodone-Homatropine Hives and Itching  . Penicillins     REACTION: rash   Current Outpatient Prescriptions on File Prior to Visit  Medication Sig Dispense Refill  . albuterol (PROVENTIL HFA;VENTOLIN HFA) 108 (90 BASE) MCG/ACT inhaler Inhale 2 puffs into the lungs every 6 (six) hours as needed. 1 Inhaler 0  . aspirin 81 MG tablet Take 81 mg by mouth daily.      . Calcium  Carbonate-Vitamin D (TH CALCIUM CARBONATE-VITAMIN D) 600-400 MG-UNIT per tablet Take 1 tablet by mouth.     . estrogen, conjugated,-medroxyprogesterone (PREMPRO) 0.3-1.5 MG per tablet Take 1 tablet by mouth daily. 90 tablet 1  . lamoTRIgine (LAMICTAL) 200 MG tablet Take 1 tablet (200 mg total) by mouth 2 (two) times daily. 60 tablet 11  . Multiple Vitamin (MULTIVITAMIN) tablet Take 1 tablet by mouth daily.      . fluticasone (FLONASE) 50 MCG/ACT nasal spray Place 2 sprays into the nose daily as needed.    . promethazine-dextromethorphan (PROMETHAZINE-DM) 6.25-15 MG/5ML syrup Take 5 mLs by mouth 4 (four) times daily as needed for cough. (Patient not taking: Reported on 05/14/2015) 118 mL 0   No current facility-administered medications on file prior to visit.    The PMH, PSH, Social History, Family History, Medications, and allergies have been reviewed in [facility], and have been updated if relevant.   Review of Systems  Constitutional: Negative.   HENT: Negative.   Eyes: Negative.   Respiratory: Negative.   Cardiovascular: Negative.   Gastrointestinal: Negative.   Endocrine: Negative.   Genitourinary: Negative.   Musculoskeletal: Negative.   Skin: Negative.   Neurological: Negative.   Hematological: Negative.   Psychiatric/Behavioral: Negative.   All other systems reviewed and are negative.    Physical Exam Pulse 70  Temp(Src) 98.7 F (37.1 C) (Oral)  Ht 5\' 7"  (1.702 m)  Wt 180 lb 6.4 oz (81.829 kg)  BMI 28.25 kg/m2  SpO2 96%  General:  Well-developed,well-nourished,in no acute distress; alert,appropriate and cooperative throughout examination Head:  normocephalic and atraumatic.   Eyes:  vision grossly intact, pupils equal, pupils round, and pupils reactive to light.   Ears:  R ear normal and L ear normal.   Nose:  no external deformity.   Mouth:  good dentition.   Neck:  No deformities, masses, or tenderness noted. Breasts:  No mass, nodules, thickening, tenderness,  bulging, retraction, inflamation, nipple discharge or skin changes noted.   Lungs:  Normal respiratory effort, chest expands symmetrically. Lungs are clear to auscultation, no crackles or wheezes. Heart:  Normal rate and regular rhythm. S1 and S2 normal without gallop, murmur, click, rub or other extra sounds. Abdomen:  Bowel sounds positive,abdomen soft and non-tender without masses, organomegaly or hernias noted. Msk:  No deformity or scoliosis noted of thoracic or lumbar spine.   Extremities:  No clubbing, cyanosis, edema, or deformity noted with normal full range of motion of all joints.   Neurologic:  alert & oriented X3 and gait normal.   Skin:  Intact without suspicious lesions or rashes Cervical Nodes:  No lymphadenopathy noted Axillary Nodes:  No palpable lymphadenopathy Psych:  Cognition and judgment appear intact. Alert and cooperative with normal attention span and concentration. No apparent delusions, illusions, hallucinations

## 2015-05-14 NOTE — Addendum Note (Signed)
Addended by: Cloyd Stagers B on: 05/14/2015 09:14 AM   Modules accepted: Orders, SmartSet

## 2015-05-14 NOTE — Addendum Note (Signed)
Addended by: Ellamae Sia on: 05/14/2015 09:38 AM   Modules accepted: Orders

## 2015-05-14 NOTE — Assessment & Plan Note (Signed)
Pt plans to wean off.

## 2015-05-14 NOTE — Assessment & Plan Note (Signed)
Followed by neurology. Continue current rx. No changes made.

## 2015-05-15 ENCOUNTER — Encounter: Payer: Self-pay | Admitting: *Deleted

## 2015-05-15 LAB — HIV ANTIBODY (ROUTINE TESTING W REFLEX): HIV: NONREACTIVE

## 2015-05-15 LAB — HEPATITIS C ANTIBODY: HCV Ab: NEGATIVE

## 2015-05-29 ENCOUNTER — Ambulatory Visit
Admission: RE | Admit: 2015-05-29 | Discharge: 2015-05-29 | Disposition: A | Discharge status: Home / Self Care | Payer: Medicare Other | Source: Ambulatory Visit | Attending: Family Medicine | Admitting: Family Medicine

## 2015-05-29 DIAGNOSIS — Z1231 Encounter for screening mammogram for malignant neoplasm of breast: Secondary | ICD-10-CM | POA: Insufficient documentation

## 2015-05-29 DIAGNOSIS — Z1239 Encounter for other screening for malignant neoplasm of breast: Secondary | ICD-10-CM

## 2015-06-19 ENCOUNTER — Ambulatory Visit: Payer: Medicare Other | Admitting: Family Medicine

## 2015-06-28 ENCOUNTER — Other Ambulatory Visit (INDEPENDENT_AMBULATORY_CARE_PROVIDER_SITE_OTHER): Payer: Medicare Other

## 2015-06-28 DIAGNOSIS — Z1211 Encounter for screening for malignant neoplasm of colon: Secondary | ICD-10-CM | POA: Diagnosis not present

## 2015-06-28 LAB — FECAL OCCULT BLOOD, IMMUNOCHEMICAL: FECAL OCCULT BLD: NEGATIVE

## 2015-07-01 ENCOUNTER — Encounter: Payer: Self-pay | Admitting: *Deleted

## 2015-08-15 ENCOUNTER — Encounter: Payer: Self-pay | Admitting: Nurse Practitioner

## 2015-08-15 ENCOUNTER — Ambulatory Visit (INDEPENDENT_AMBULATORY_CARE_PROVIDER_SITE_OTHER): Payer: Medicare HMO | Admitting: Nurse Practitioner

## 2015-08-15 VITALS — BP 147/73 | HR 64 | Ht 67.0 in | Wt 183.8 lb

## 2015-08-15 DIAGNOSIS — G40209 Localization-related (focal) (partial) symptomatic epilepsy and epileptic syndromes with complex partial seizures, not intractable, without status epilepticus: Secondary | ICD-10-CM | POA: Diagnosis not present

## 2015-08-15 MED ORDER — LAMOTRIGINE 200 MG PO TABS: 200.0000 mg | ORAL_TABLET | Freq: Two times a day (BID) | ORAL | Status: DC

## 2015-08-15 NOTE — Patient Instructions (Signed)
Continue Lamictal at current dose will refill Call for any seizure activity Follow-up yearly and when necessary  

## 2015-08-15 NOTE — Progress Notes (Signed)
I have reviewed and agreed above plan. 

## 2015-08-15 NOTE — Progress Notes (Signed)
[name] DOB: 1950-10-23   REASON FOR VISIT: Complex partial seizures HISTORY FROM: Patient    HISTORY OF PRESENT ILLNESS:Lisa Yu, is a 65 year-old Caucasian female with history of complex partial seizures with secondary generalization. Last clinical visit was 05/10/16 with Dr. Krista Blue.  She is taking and tolerating Generic  Lamictal  200mg  BID. Her last seizure occurred in July, 2010, on a day of traveling. She says that she was tired and experienced a funny feeling like when she had seizures in the past, but no overt seizure activity. She has had no further auras or seizures. She is retired and keeps her grandchildren. Yearly labs are followed by her primary care. These were reviewed from October 2016. No new neurological complaints.   UPDATE Oct 15th 2015: She has has no recurrent seizure, is taking brand name Lamictal xr 100mg  4 tabs qhs. She is worried about the medication cost, open to option of generic, She still drives.  UPDATE Jan 19th 2016: Left parietal area intermittent transient sharp pain, lasting for few second, mild headache afterwards, she tolerating generic lamotrigine xr 100mg  4 tabs well. Level was 11.8.    REVIEW OF SYSTEMS: Full 14 system review of systems performed and notable only for those listed, all others are neg:  Constitutional: neg  Cardiovascular: neg Ear/Nose/Throat: neg  Skin: neg Eyes: neg Respiratory: neg Gastroitestinal: neg  Hematology/Lymphatic: neg  Endocrine: neg Musculoskeletal:neg Allergy/Immunology: neg Neurological: neg Psychiatric: neg Sleep : neg   ALLERGIES: Allergies  Allergen Reactions  . Shellfish Allergy Swelling and Rash  . Hydrocodone-Homatropine Hives and Itching  . Penicillins     REACTION: rash    HOME MEDICATIONS: Outpatient Prescriptions Prior to Visit  Medication Sig Dispense Refill  . albuterol (PROVENTIL HFA;VENTOLIN HFA) 108 (90 BASE) MCG/ACT inhaler  Inhale 2 puffs into the lungs every 6 (six) hours as needed. 1 Inhaler 0  . aspirin 81 MG tablet Take 81 mg by mouth daily.      . Calcium Carbonate-Vitamin D (TH CALCIUM CARBONATE-VITAMIN D) 600-400 MG-UNIT per tablet Take 1 tablet by mouth.     . lamoTRIgine (LAMICTAL) 200 MG tablet Take 1 tablet (200 mg total) by mouth 2 (two) times daily. 60 tablet 11  . Multiple Vitamin (MULTIVITAMIN) tablet Take 1 tablet by mouth daily.      Marland Kitchen estrogen, conjugated,-medroxyprogesterone (PREMPRO) 0.3-1.5 MG per tablet Take 1 tablet by mouth daily. (Patient not taking: Reported on 08/15/2015) 90 tablet 1  . fluticasone (FLONASE) 50 MCG/ACT nasal spray Place 2 sprays into the nose daily as needed.     No facility-administered medications prior to visit.    PAST MEDICAL HISTORY: Past Medical History  Diagnosis Date  . Diverticulosis   . Osteopenia 12/21/2007  . Seizures ([place])     PAST SURGICAL HISTORY: Past Surgical History  Procedure Laterality Date  . Vaginal delivery      x 1  . Breast surgery  1983    bilateral implants  . Cervical disease  1999    H/O Cryo  . Lasik  2007  . Colonoscopy  05/01/2005  . Foot surgery Bilateral   . Augmentation mammaplasty Bilateral 1998    removed 2013    FAMILY HISTORY: Family History  Problem Relation Age of Onset  . Cancer Mother     skin    SOCIAL HISTORY: Social History   Social History  . Marital Status: Married    Spouse Name: Ernie Hew  . Number  of Children: 1  . Years of Education: 43 th   Occupational History  . Retired from [employer] Topics  . Smoking status: Never Smoker   . Smokeless tobacco: Never Used  . Alcohol Use: No  . Drug Use: No  . Sexual Activity: Not on file   Other Topics Concern  . Not on file   Social History Narrative   Patient lives at home with her husband Ernie Hew).   Retired and she is self employed.   Education 12 th    Right handed.   Caffeine two cups of coffee one soda daily.        Would desire CPR, no prolonged life support if futile.     PHYSICAL EXAM  Filed Vitals:   08/15/15 0803  BP: 147/73  Pulse: 64  Height: 5\' 7"  (1.702 m)  Weight: 183 lb 12.8 oz (83.371 kg)   Body mass index is 28.78 kg/(m^2). Generalized: Well developed, in no acute distress  Head: normocephalic and atraumatic,. Oropharynx benign  Neck: Supple, no carotid bruits  Cardiac: Regular rate rhythm, no murmur  Musculoskeletal: No deformity   Neurological examination   Mentation: Alert oriented to time, place, history taking. Follows all commands speech and language fluent  Cranial nerve II-XII: .Pupils were equal round reactive to light extraocular movements were full, visual field were full on confrontational test. Facial sensation and strength were normal. hearing was intact to finger rubbing bilaterally. Uvula tongue midline. head turning and shoulder shrug and were normal and symmetric.Tongue protrusion into cheek strength was normal. Motor: normal bulk and tone, full strength in the BUE, BLE, fine finger movements normal, no pronator drift. No focal weakness Sensory: normal and symmetric to light touch, pinprick, and vibration  Coordination: finger-nose-finger, heel-to-shin bilaterally, no dysmetria Reflexes: 1+ upper, lower and symmetric Gait and Station: Rising up from seated position without assistance, normal stance, moderate stride, good arm swing, smooth turning, able to perform tiptoe, and heel walking without difficulty. Tandem steady.    DIAGNOSTIC DATA (LABS, IMAGING, TESTING) - I reviewed patient records, labs, notes, testing and imaging myself where available.  Lab Results  Component Value Date   WBC 7.2 05/14/2015   HGB 13.2 05/14/2015   HCT 40.4 05/14/2015   MCV 90.0 05/14/2015   PLT 321.0 05/14/2015      Component Value Date/Time   NA 141 05/14/2015 0834   K 4.4 05/14/2015 0834   CL 105 05/14/2015 0834   CO2 31 05/14/2015 0834   GLUCOSE 96  05/14/2015 0834   BUN 21 05/14/2015 0834   CREATININE 0.64 05/14/2015 0834   CALCIUM 9.7 05/14/2015 0834   PROT 6.9 05/14/2015 0834   ALBUMIN 4.2 05/14/2015 0834   AST 21 05/14/2015 0834   ALT 20 05/14/2015 0834   ALKPHOS 52 05/14/2015 0834   BILITOT 0.5 05/14/2015 0834   GFRNONAA 87.85 12/31/2009 0953   GFRAA 111 12/21/2007 0816   Lab Results  Component Value Date   CHOL 186 05/14/2015   HDL 67.10 05/14/2015   LDLCALC 109* 05/14/2015   LDLDIRECT 139.7 01/29/2011   TRIG 48.0 05/14/2015   CHOLHDL 3 05/14/2015    Lab Results  Component Value Date   TSH 2.50 05/14/2015      ASSESSMENT AND PLAN  65 y.o. year old female  has a past medical history of Seizures ([place]). here to follow-up. Last seizure activity 2010.  Continue Lamictal at current dose will refill Call for any seizure activity  Follow-up yearly and when necessary Dennie Bible, [facility], [facility], APRN  [facility] [address] [place], [place] [address] [phone]

## 2015-10-09 ENCOUNTER — Ambulatory Visit: Payer: Medicare Other | Admitting: Family Medicine

## 2015-10-10 ENCOUNTER — Encounter: Payer: Self-pay | Admitting: Primary Care

## 2015-10-10 ENCOUNTER — Ambulatory Visit (INDEPENDENT_AMBULATORY_CARE_PROVIDER_SITE_OTHER): Payer: Medicare HMO | Admitting: Primary Care

## 2015-10-10 VITALS — BP 122/84 | HR 71 | Temp 97.9°F | Ht 67.0 in | Wt 174.4 lb

## 2015-10-10 DIAGNOSIS — R062 Wheezing: Secondary | ICD-10-CM

## 2015-10-10 DIAGNOSIS — R05 Cough: Secondary | ICD-10-CM

## 2015-10-10 DIAGNOSIS — R059 Cough, unspecified: Secondary | ICD-10-CM

## 2015-10-10 MED ORDER — ALBUTEROL SULFATE HFA 108 (90 BASE) MCG/ACT IN AERS: 2.0000 | INHALATION_SPRAY | Freq: Four times a day (QID) | RESPIRATORY_TRACT | Status: DC | PRN

## 2015-10-10 MED ORDER — BENZONATATE 200 MG PO CAPS: 200.0000 mg | ORAL_CAPSULE | Freq: Three times a day (TID) | ORAL | Status: DC | PRN

## 2015-10-10 MED ORDER — DOXYCYCLINE HYCLATE 100 MG PO TABS: 100.0000 mg | ORAL_TABLET | Freq: Two times a day (BID) | ORAL | Status: DC

## 2015-10-10 NOTE — Progress Notes (Signed)
Pre visit review using our clinic review tool, if applicable. No additional management support is needed unless otherwise documented below in the visit note. 

## 2015-10-10 NOTE — Patient Instructions (Signed)
Start Doxycycline antibiotic. Take 1 tablet by mouth twice daily for 10 days.  You may take Benzonatate capsules for cough. Take 1 capsule by mouth three times daily as needed for cough.  You may use the albuterol inhaler every 6 hours as needed for wheezing and shortness of breath.  Increase consumption of water and rest. Please call us if no improvement in symptoms in 3-4 days.  It was a pleasure meeting you!

## 2015-10-10 NOTE — Progress Notes (Signed)
Subjective:    Patient ID: Lisa Yu, female    DOB: 1951/05/28, 65 y.o.   MRN: [id number]  HPI  Ms. Combest is a 65 year female who presents today with a chief complaint of cough. She also reports shortness of breath and wheezing. Her symptoms have been present for 10 days. Her cough is productive with green sputum. She's not taken anything OTC for her symptoms. Denies history of tobacco abuse or asthma. Denies sick contacts.  Review of Systems  Constitutional: Positive for fatigue. Negative for fever.  HENT: Negative for congestion and sinus pressure.   Respiratory: Positive for cough, shortness of breath and wheezing.   Musculoskeletal: Negative for myalgias.       Past Medical History  Diagnosis Date  . Diverticulosis   . Osteopenia 12/21/2007  . Seizures [facility])     Social History   Social History  . Marital Status: Married    Spouse Name: Ernie Hew  . Number of Children: 1  . Years of Education: 59 th   Occupational History  . Retired from [employer] Topics  . Smoking status: Never Smoker   . Smokeless tobacco: Never Used  . Alcohol Use: No  . Drug Use: No  . Sexual Activity: Not on file   Other Topics Concern  . Not on file   Social History Narrative   Patient lives at home with her husband Ernie Hew).   Retired and she is self employed.   Education 12 th    Right handed.   Caffeine two cups of coffee one soda daily.      Would desire CPR, no prolonged life support if futile.    Past Surgical History  Procedure Laterality Date  . Vaginal delivery      x 1  . Breast surgery  1983    bilateral implants  . Cervical disease  1999    H/O Cryo  . Lasik  2007  . Colonoscopy  05/01/2005  . Foot surgery Bilateral   . Augmentation mammaplasty Bilateral 1998    removed 2013    Family History  Problem Relation Age of Onset  . Cancer Mother     skin    Allergies  Allergen Reactions  . Shellfish Allergy Swelling and Rash  .  Hydrocodone-Homatropine Hives and Itching  . Penicillins     REACTION: rash    Current Outpatient Prescriptions on File Prior to Visit  Medication Sig Dispense Refill  . aspirin 81 MG tablet Take 81 mg by mouth daily.      . Calcium Carbonate-Vitamin D (TH CALCIUM CARBONATE-VITAMIN D) 600-400 MG-UNIT per tablet Take 1 tablet by mouth.     . lamoTRIgine (LAMICTAL) 200 MG tablet Take 1 tablet (200 mg total) by mouth 2 (two) times daily. 60 tablet 11  . Multiple Vitamin (MULTIVITAMIN) tablet Take 1 tablet by mouth daily.      . fluticasone (FLONASE) 50 MCG/ACT nasal spray Place 2 sprays into the nose daily as needed.     No current facility-administered medications on file prior to visit.    BP 122/84 mmHg  Pulse 71  Temp(Src) 97.9 F (36.6 C) (Oral)  Ht 5\' 7"  (1.702 m)  Wt 174 lb 6.4 oz (79.107 kg)  BMI 27.31 kg/m2  SpO2 97%    Objective:   Physical Exam  Constitutional: She appears well-nourished. She appears ill.  HENT:  Right Ear: Tympanic membrane and ear canal normal.  Left Ear: Tympanic  membrane and ear canal normal.  Nose: Right sinus exhibits maxillary sinus tenderness. Right sinus exhibits no frontal sinus tenderness. Left sinus exhibits maxillary sinus tenderness. Left sinus exhibits no frontal sinus tenderness.  Mouth/Throat: Oropharynx is clear and moist.  Neck: Neck supple.  Cardiovascular: Normal rate and regular rhythm.   Pulmonary/Chest: Effort normal. She has no decreased breath sounds. She has wheezes in the right upper field, the right lower field, the left upper field and the left lower field. She has rhonchi in the right upper field, the right lower field, the left upper field and the left lower field.  Skin: Skin is warm and dry.          Assessment & Plan:  Acute Bronchitis:  Cough, congestion, green sputum from chest wall x 10 days. She's taken nothing OTC for symptoms.  Exam with moderate rhonchi and wheezing to all lung fields. Start  Doxycycline BID x 10 days. RX for Tessalon pearls and albuterol inhaler PRN. Return precautions provided.

## 2015-10-14 ENCOUNTER — Telehealth: Payer: Self-pay

## 2015-10-14 DIAGNOSIS — J209 Acute bronchitis, unspecified: Secondary | ICD-10-CM

## 2015-10-14 NOTE — Telephone Encounter (Signed)
Pt was seen 10/10/15; pt is still taking abx but not a lot better; pt still has prod cough with green phlegm; pt continues with wheezing and SOB after coughing episode; albuterol inhaler not helping.No fever. Pt is going on cruise in 2 weeks and pt does not want to be sick on cruise. Pt wants message to go to Dr Deborra Medina. [facility] [place]. Please advise.

## 2015-10-15 MED ORDER — PREDNISONE 10 MG PO TABS: ORAL_TABLET | ORAL | Status: DC

## 2015-10-15 NOTE — Telephone Encounter (Signed)
I did not see her for this so I really need to route to provider who evaluated.

## 2015-10-15 NOTE — Telephone Encounter (Signed)
Called and notified patient of Kate's comments. Patient is agreeable to the prednisone taper to [facility] in [place].

## 2015-10-15 NOTE — Telephone Encounter (Signed)
Sorry to hear she's not feeling better. She's on a strong antibiotic that should cover any bacterial involvement. If she's still wheezing and short of breath we can try a prednisone taper which may help. If she's agreeable, I'll send.

## 2015-10-15 NOTE — Telephone Encounter (Signed)
Noted. RX sent

## 2015-10-22 ENCOUNTER — Encounter: Payer: Self-pay | Admitting: Family Medicine

## 2015-10-22 ENCOUNTER — Ambulatory Visit (INDEPENDENT_AMBULATORY_CARE_PROVIDER_SITE_OTHER): Payer: Medicare HMO | Admitting: Family Medicine

## 2015-10-22 VITALS — BP 114/68 | HR 65 | Temp 97.7°F | Wt 173.2 lb

## 2015-10-22 DIAGNOSIS — J209 Acute bronchitis, unspecified: Secondary | ICD-10-CM | POA: Diagnosis not present

## 2015-10-22 MED ORDER — AZITHROMYCIN 250 MG PO TABS: ORAL_TABLET | ORAL | Status: DC

## 2015-10-22 NOTE — Progress Notes (Signed)
SUBJECTIVE:  Lisa Yu is a 65 y.o. female who complains of congestion, productive cough and wheezing for 25 days. She denies a history of anorexia and fevers and admits to a history of asthma. Patient denies smoke cigarettes.   Saw [facility] on 3/17, note reviewed- Doxycyline 100 mg twice daily x 10 days, albuterol and tessalon.  She called back on 3/20 with persistent symptoms, placed on course of prednisone.  She is here today because she feels cough is worsening.  Current Outpatient Prescriptions on File Prior to Visit  Medication Sig Dispense Refill  . albuterol (PROVENTIL HFA;VENTOLIN HFA) 108 (90 Base) MCG/ACT inhaler Inhale 2 puffs into the lungs every 6 (six) hours as needed for wheezing or shortness of breath. 1 Inhaler 0  . aspirin 81 MG tablet Take 81 mg by mouth daily.      . Calcium Carbonate-Vitamin D (TH CALCIUM CARBONATE-VITAMIN D) 600-400 MG-UNIT per tablet Take 1 tablet by mouth.     . lamoTRIgine (LAMICTAL) 200 MG tablet Take 1 tablet (200 mg total) by mouth 2 (two) times daily. 60 tablet 11  . Multiple Vitamin (MULTIVITAMIN) tablet Take 1 tablet by mouth daily.      . fluticasone (FLONASE) 50 MCG/ACT nasal spray Place 2 sprays into the nose daily as needed.     No current facility-administered medications on file prior to visit.    Allergies  Allergen Reactions  . Shellfish Allergy Swelling and Rash  . Hydrocodone-Homatropine Hives and Itching  . Penicillins     REACTION: rash    Past Medical History  Diagnosis Date  . Diverticulosis   . Osteopenia 12/21/2007  . Seizures [facility])     Past Surgical History  Procedure Laterality Date  . Vaginal delivery      x 1  . Breast surgery  1983    bilateral implants  . Cervical disease  1999    H/O Cryo  . Lasik  2007  . Colonoscopy  05/01/2005  . Foot surgery Bilateral   . Augmentation mammaplasty Bilateral 1998    removed 2013    Family History  Problem Relation Age of Onset  . Cancer Mother    skin    Social History   Social History  . Marital Status: Married    Spouse Name: Ernie Hew  . Number of Children: 1  . Years of Education: 40 th   Occupational History  . Retired from [employer] Topics  . Smoking status: Never Smoker   . Smokeless tobacco: Never Used  . Alcohol Use: No  . Drug Use: No  . Sexual Activity: Not on file   Other Topics Concern  . Not on file   Social History Narrative   Patient lives at home with her husband Ernie Hew).   Retired and she is self employed.   Education 12 th    Right handed.   Caffeine two cups of coffee one soda daily.      Would desire CPR, no prolonged life support if futile.   The PMH, PSH, Social History, Family History, Medications, and allergies have been reviewed in [facility], and have been updated if relevant.  OBJECTIVE: BP 114/68 mmHg  Pulse 65  Temp(Src) 97.7 F (36.5 C) (Oral)  Wt 173 lb 4 oz (78.586 kg)  SpO2 99%  She appears well, vital signs are as noted. Ears normal.  Throat and pharynx normal.  Neck supple. No adenopathy in the neck. Nose is congested. Sinuses  non tender. Diffuse exp wheezes  ASSESSMENT:  bronchitis  PLAN: Zpack for coverage of atypical bacteria.  Continue supportive care. Symptomatic therapy suggested: push fluids, rest and return office visit prn if symptoms persist or worsenCall or return to clinic prn if these symptoms worsen or fail to improve as anticipated.

## 2015-10-22 NOTE — Progress Notes (Signed)
Pre visit review using our clinic review tool, if applicable. No additional management support is needed unless otherwise documented below in the visit note. 

## 2015-12-27 ENCOUNTER — Ambulatory Visit (INDEPENDENT_AMBULATORY_CARE_PROVIDER_SITE_OTHER): Payer: Medicare HMO | Admitting: Primary Care

## 2015-12-27 ENCOUNTER — Encounter: Payer: Self-pay | Admitting: Primary Care

## 2015-12-27 ENCOUNTER — Ambulatory Visit: Payer: Medicare HMO | Admitting: Family Medicine

## 2015-12-27 VITALS — BP 124/80 | HR 75 | Temp 98.2°F | Ht 67.0 in | Wt 175.4 lb

## 2015-12-27 DIAGNOSIS — R1010 Upper abdominal pain, unspecified: Secondary | ICD-10-CM | POA: Diagnosis not present

## 2015-12-27 DIAGNOSIS — M545 Low back pain, unspecified: Secondary | ICD-10-CM

## 2015-12-27 LAB — POC URINALSYSI DIPSTICK (AUTOMATED)
BILIRUBIN UA: NEGATIVE
Blood, UA: NEGATIVE
Glucose, UA: NEGATIVE
Ketones, UA: NEGATIVE
NITRITE UA: NEGATIVE
PH UA: 6
Protein, UA: NEGATIVE
Spec Grav, UA: 1.01
UROBILINOGEN UA: NEGATIVE

## 2015-12-27 MED ORDER — ETODOLAC 400 MG PO TABS: 400.0000 mg | ORAL_TABLET | Freq: Two times a day (BID) | ORAL | Status: DC | PRN

## 2015-12-27 NOTE — Progress Notes (Signed)
Subjective:    Patient ID: Lisa Yu, female    DOB: 10/20/1950, 65 y.o.   MRN: [id number]  HPI  [name] is a 65 year old female who presents today with a chief complaint of low back pain. Her pain is located to the left lower back. She has a history of chronic back pain, but this pain feels different that her usual pain.   Her discomfort has been present for 2 weeks, but became worse last night. Her pain is worse with movement, and typically worse in the morning upon waking. She describes her pain as a "bad ache". She doesn't remember over exerting herself recently. Denies urinary frequency, dysuria, hematuria. She doesn't drink much water on a daily basis. She's taken Advil and an old prescription of Etodolac for her pain with some improvement. She is worried she may have an urinary tract infection.   Review of Systems  Constitutional: Negative for fever.  Genitourinary: Negative for dysuria, frequency, vaginal discharge and pelvic pain.  Musculoskeletal: Positive for back pain.       Past Medical History  Diagnosis Date  . Diverticulosis   . Osteopenia 12/21/2007  . Seizures [facility])      Social History   Social History  . Marital Status: Married    Spouse Name: Ernie Hew  . Number of Children: 1  . Years of Education: 78 th   Occupational History  . Retired from [employer] Topics  . Smoking status: Never Smoker   . Smokeless tobacco: Never Used  . Alcohol Use: No  . Drug Use: No  . Sexual Activity: Not on file   Other Topics Concern  . Not on file   Social History Narrative   Patient lives at home with her husband Ernie Hew).   Retired and she is self employed.   Education 12 th    Right handed.   Caffeine two cups of coffee one soda daily.      Would desire CPR, no prolonged life support if futile.    Past Surgical History  Procedure Laterality Date  . Vaginal delivery      x 1  . Breast surgery  1983    bilateral implants  .  Cervical disease  1999    H/O Cryo  . Lasik  2007  . Colonoscopy  05/01/2005  . Foot surgery Bilateral   . Augmentation mammaplasty Bilateral 1998    removed 2013    Family History  Problem Relation Age of Onset  . Cancer Mother     skin    Allergies  Allergen Reactions  . Shellfish Allergy Swelling and Rash  . Hydrocodone-Homatropine Hives and Itching  . Penicillins     REACTION: rash    Current Outpatient Prescriptions on File Prior to Visit  Medication Sig Dispense Refill  . albuterol (PROVENTIL HFA;VENTOLIN HFA) 108 (90 Base) MCG/ACT inhaler Inhale 2 puffs into the lungs every 6 (six) hours as needed for wheezing or shortness of breath. 1 Inhaler 0  . aspirin 81 MG tablet Take 81 mg by mouth daily.      . Calcium Carbonate-Vitamin D (TH CALCIUM CARBONATE-VITAMIN D) 600-400 MG-UNIT per tablet Take 1 tablet by mouth.     . lamoTRIgine (LAMICTAL) 200 MG tablet Take 1 tablet (200 mg total) by mouth 2 (two) times daily. 60 tablet 11  . Multiple Vitamin (MULTIVITAMIN) tablet Take 1 tablet by mouth daily.      . fluticasone (FLONASE)  50 MCG/ACT nasal spray Place 2 sprays into the nose daily as needed. Reported on 12/27/2015     No current facility-administered medications on file prior to visit.    BP 124/80 mmHg  Pulse 75  Temp(Src) 98.2 F (36.8 C) (Oral)  Ht 5\' 7"  (1.702 m)  Wt 175 lb 6.4 oz (79.561 kg)  BMI 27.47 kg/m2  SpO2 99%    Objective:   Physical Exam  Constitutional: She appears well-nourished.  Cardiovascular: Normal rate and regular rhythm.   Pulmonary/Chest: Effort normal and breath sounds normal.  Abdominal: Soft. There is no CVA tenderness.  Musculoskeletal:  Tenderness to left lower back upon palpation.   Skin: Skin is warm and dry.          Assessment & Plan:  Back Pain:  Located to left lower back x 2 weeks. No recent injury/trauma. Pain reproducible in clinic today. UA: 1+ leuks, no nitrites, no blood. Culture sent. Will hold off on  antibiotic treatment until culture returns as I'm not quite convinced this is urinary related.  Refill of Etodolac provided today to use PRN. Application of heat PRN. Encouraged increasing water consumption for now. Will be in touch with patient once Culture returns.

## 2015-12-27 NOTE — Patient Instructions (Signed)
I sent a refill of the etodolac medication to your pharmacy. Take 1 tablet by mouth twice daily as needed for back pain. Do not take advil, motrin, ibuprofen, aleve with this medication.  Continue application of heat for your back pain.  I will notify you of the urine results once I receive them.  Continue to increase consumption of water.   I will be in touch soon!  It was a pleasure meeting you!

## 2015-12-27 NOTE — Progress Notes (Signed)
Pre visit review using our clinic review tool, if applicable. No additional management support is needed unless otherwise documented below in the visit note. 

## 2015-12-29 LAB — URINE CULTURE: Colony Count: 10000

## 2015-12-30 DIAGNOSIS — Z1283 Encounter for screening for malignant neoplasm of skin: Secondary | ICD-10-CM | POA: Diagnosis not present

## 2015-12-30 DIAGNOSIS — L820 Inflamed seborrheic keratosis: Secondary | ICD-10-CM | POA: Diagnosis not present

## 2015-12-30 DIAGNOSIS — D225 Melanocytic nevi of trunk: Secondary | ICD-10-CM | POA: Diagnosis not present

## 2016-01-16 ENCOUNTER — Ambulatory Visit (INDEPENDENT_AMBULATORY_CARE_PROVIDER_SITE_OTHER): Payer: Medicare HMO | Admitting: Family Medicine

## 2016-01-16 ENCOUNTER — Ambulatory Visit (INDEPENDENT_AMBULATORY_CARE_PROVIDER_SITE_OTHER)
Admission: RE | Admit: 2016-01-16 | Discharge: 2016-01-16 | Disposition: A | Discharge status: Home / Self Care | Payer: Medicare HMO | Source: Ambulatory Visit | Attending: Family Medicine | Admitting: Family Medicine

## 2016-01-16 ENCOUNTER — Encounter: Payer: Self-pay | Admitting: Family Medicine

## 2016-01-16 VITALS — BP 122/72 | HR 65 | Temp 97.9°F | Wt 176.8 lb

## 2016-01-16 DIAGNOSIS — G4700 Insomnia, unspecified: Secondary | ICD-10-CM | POA: Diagnosis not present

## 2016-01-16 DIAGNOSIS — S8991XA Unspecified injury of right lower leg, initial encounter: Secondary | ICD-10-CM | POA: Diagnosis not present

## 2016-01-16 DIAGNOSIS — M25561 Pain in right knee: Secondary | ICD-10-CM | POA: Diagnosis not present

## 2016-01-16 MED ORDER — TRAZODONE HCL 50 MG PO TABS: 25.0000 mg | ORAL_TABLET | Freq: Every evening | ORAL | Status: DC | PRN

## 2016-01-16 NOTE — Progress Notes (Signed)
SUBJECTIVE: Lisa Yu is a 65 y.o. female who sustained a right knee injury 1 week(s) ago. Mechanism of injury: twisted her knee getting out of the car. Immediate symptoms: immediate pain. Symptoms have been constant since that time. Prior history of related problems: no prior problems with this area in the past.  She would also like to discuss insomnia.  Continues to have difficulty falling and staying asleep.  OTC rxs not helpful. Current Outpatient Prescriptions on File Prior to Visit  Medication Sig Dispense Refill  . albuterol (PROVENTIL HFA;VENTOLIN HFA) 108 (90 Base) MCG/ACT inhaler Inhale 2 puffs into the lungs every 6 (six) hours as needed for wheezing or shortness of breath. 1 Inhaler 0  . aspirin 81 MG tablet Take 81 mg by mouth daily.      . Calcium Carbonate-Vitamin D (TH CALCIUM CARBONATE-VITAMIN D) 600-400 MG-UNIT per tablet Take 1 tablet by mouth.     . etodolac (LODINE) 400 MG tablet Take 1 tablet (400 mg total) by mouth 2 (two) times daily as needed for moderate pain. 60 tablet 0  . lamoTRIgine (LAMICTAL) 200 MG tablet Take 1 tablet (200 mg total) by mouth 2 (two) times daily. 60 tablet 11  . Multiple Vitamin (MULTIVITAMIN) tablet Take 1 tablet by mouth daily.      . fluticasone (FLONASE) 50 MCG/ACT nasal spray Place 2 sprays into the nose daily as needed. Reported on 12/27/2015     No current facility-administered medications on file prior to visit.    Allergies  Allergen Reactions  . Shellfish Allergy Swelling and Rash  . Hydrocodone-Homatropine Hives and Itching  . Penicillins     REACTION: rash    Past Medical History  Diagnosis Date  . Diverticulosis   . Osteopenia 12/21/2007  . Seizures [facility])     Past Surgical History  Procedure Laterality Date  . Vaginal delivery      x 1  . Breast surgery  1983    bilateral implants  . Cervical disease  1999    H/O Cryo  . Lasik  2007  . Colonoscopy  05/01/2005  . Foot surgery Bilateral   . Augmentation  mammaplasty Bilateral 1998    removed 2013    Family History  Problem Relation Age of Onset  . Cancer Mother     skin    Social History   Social History  . Marital Status: Married    Spouse Name: Ernie Hew  . Number of Children: 1  . Years of Education: 13 th   Occupational History  . Retired from [employer] Topics  . Smoking status: Never Smoker   . Smokeless tobacco: Never Used  . Alcohol Use: No  . Drug Use: No  . Sexual Activity: Not on file   Other Topics Concern  . Not on file   Social History Narrative   Patient lives at home with her husband Ernie Hew).   Retired and she is self employed.   Education 12 th    Right handed.   Caffeine two cups of coffee one soda daily.      Would desire CPR, no prolonged life support if futile.   The PMH, PSH, Social History, Family History, Medications, and allergies have been reviewed in [facility], and have been updated if relevant.  Review of Systems  Constitutional: Negative.   HENT: Negative.   Eyes: Negative.   Respiratory: Negative.   Musculoskeletal: Positive for arthralgias.  Psychiatric/Behavioral: Positive for sleep disturbance. Negative  for behavioral problems, confusion, decreased concentration and agitation. The patient is not nervous/anxious.   All other systems reviewed and are negative.   OBJECTIVE: BP 122/72 mmHg  Pulse 65  Temp(Src) 97.9 F (36.6 C) (Oral)  Wt 176 lb 12 oz (80.173 kg)  SpO2 99%  Vital signs as noted above. Appearance: alert, well appearing, and in no distress. Knee exam: normal exam, no swelling, tenderness, instability; ligaments intact, FROM. X-ray: ordered, but results not yet available. Psych:  Good eye contact, not anxious or depressed appearing

## 2016-01-16 NOTE — Progress Notes (Signed)
Pre visit review using our clinic review tool, if applicable. No additional management support is needed unless otherwise documented below in the visit note. 

## 2016-01-16 NOTE — Assessment & Plan Note (Signed)
>  25 min spent with patient, at least half of which was spent on counseling insomnia.  The problem of recurrent insomnia is discussed. Avoidance of caffeine sources is strongly encouraged. Sleep hygiene issues are reviewed.  eRx sent for trazodone.

## 2016-01-16 NOTE — Assessment & Plan Note (Signed)
rest the injured area as much as practical, X-Ray ordered See orders for this visit as documented in the electronic medical record.

## 2016-01-29 ENCOUNTER — Other Ambulatory Visit: Payer: Self-pay | Admitting: Primary Care

## 2016-01-29 NOTE — Telephone Encounter (Signed)
#  60x0 last written on 12/27/15 at pt's acute appt for back pain with Anda Kraft. No future appt scheduled.

## 2016-02-11 DIAGNOSIS — M1711 Unilateral primary osteoarthritis, right knee: Secondary | ICD-10-CM | POA: Diagnosis not present

## 2016-02-21 DIAGNOSIS — M1711 Unilateral primary osteoarthritis, right knee: Secondary | ICD-10-CM | POA: Diagnosis not present

## 2016-02-27 DIAGNOSIS — G8929 Other chronic pain: Secondary | ICD-10-CM | POA: Diagnosis not present

## 2016-02-27 DIAGNOSIS — M1711 Unilateral primary osteoarthritis, right knee: Secondary | ICD-10-CM | POA: Diagnosis not present

## 2016-02-27 DIAGNOSIS — S8981XD Other specified injuries of right lower leg, subsequent encounter: Secondary | ICD-10-CM | POA: Diagnosis not present

## 2016-02-27 DIAGNOSIS — M25561 Pain in right knee: Secondary | ICD-10-CM | POA: Diagnosis not present

## 2016-03-09 ENCOUNTER — Ambulatory Visit (INDEPENDENT_AMBULATORY_CARE_PROVIDER_SITE_OTHER): Payer: Medicare HMO

## 2016-03-09 ENCOUNTER — Encounter: Payer: Self-pay | Admitting: Podiatry

## 2016-03-09 ENCOUNTER — Ambulatory Visit (INDEPENDENT_AMBULATORY_CARE_PROVIDER_SITE_OTHER): Payer: Medicare HMO | Admitting: Podiatry

## 2016-03-09 VITALS — BP 148/69 | HR 72 | Resp 12

## 2016-03-09 DIAGNOSIS — M722 Plantar fascial fibromatosis: Secondary | ICD-10-CM | POA: Diagnosis not present

## 2016-03-09 NOTE — Patient Instructions (Signed)

## 2016-03-09 NOTE — Progress Notes (Signed)
She presents today with a chief complaint of pain to the plantar aspect of the left heel times past 2 months.  Objective: Vital signs are stable she is alert and oriented 3. Pulses are palpable. She is pain on palpation medial calcaneal tubercle of the left heel. Ready gross 3 views taken today demonstrate soft tissue increase in density of the plantar fascia cannula insertion site. No fractures are then applied.  Assessment: Pain in limb secondary to plantar fasciitis left heel.  Plan: Injected left heel today put her in a plantar fascia brace and the nicely. Discussed proper shoe gear stretching her size and ice therapy. No oral medication was prescribed at this point.

## 2016-04-08 ENCOUNTER — Ambulatory Visit: Payer: Medicare HMO | Admitting: Podiatry

## 2016-04-23 DIAGNOSIS — M2391 Unspecified internal derangement of right knee: Secondary | ICD-10-CM | POA: Diagnosis not present

## 2016-04-23 DIAGNOSIS — M25561 Pain in right knee: Secondary | ICD-10-CM | POA: Diagnosis not present

## 2016-04-28 DIAGNOSIS — R69 Illness, unspecified: Secondary | ICD-10-CM | POA: Diagnosis not present

## 2016-04-30 ENCOUNTER — Encounter
Admission: RE | Admit: 2016-04-30 | Discharge: 2016-04-30 | Disposition: A | Discharge status: Home / Self Care | Payer: Medicare HMO | Source: Ambulatory Visit | Attending: Orthopedic Surgery | Admitting: Orthopedic Surgery

## 2016-04-30 NOTE — Patient Instructions (Signed)
  Your procedure is scheduled on: 05-13-16 Report to Same Day Surgery 2nd floor medical mall To find out your arrival time please call [phone] between 1PM - 3PM on 05-12-16  Remember: Instructions that are not followed completely may result in serious medical risk, up to and including death, or upon the discretion of your surgeon and anesthesiologist your surgery may need to be rescheduled.    _x___ 1. Do not eat food or drink liquids after midnight. No gum chewing or hard candies.     __x__ 2. No Alcohol for 24 hours before or after surgery.   __x__3. No Smoking for 24 prior to surgery.   ____  4. Bring all medications with you on the day of surgery if instructed.    __x__ 5. Notify your doctor if there is any change in your medical condition     (cold, fever, infections).     Do not wear jewelry, make-up, hairpins, clips or nail polish.  Do not wear lotions, powders, or perfumes. You may wear deodorant.  Do not shave 48 hours prior to surgery. Men may shave face and neck.  Do not bring valuables to the hospital.    [facility] is not responsible for any belongings or valuables.               Contacts, dentures or bridgework may not be worn into surgery.  Leave your suitcase in the car. After surgery it may be brought to your room.  For patients admitted to the hospital, discharge time is determined by your treatment team.   Patients discharged the day of surgery will not be allowed to drive home.    Please read over the following fact sheets that you were given:   [facility] Preparing for Surgery and or MRSA Information   _X___ Take these medicines the morning of surgery with A SIP OF WATER:    1. LAMICTAL  2.  3.  4.  5.  6.  ____Fleets enema or Magnesium Citrate as directed.   ____ Use CHG Soap or sage wipes as directed on instruction sheet   ____ Use inhalers on the day of surgery and bring to hospital day of surgery  ____ Stop metformin 2 days prior to  surgery    ____ Take 1/2 of usual insulin dose the night before surgery and none on the morning of surgery.   _X___ Stop aspirin or coumadin, or plavix-STOP ASPIRIN  7 DAYS PRIOR TO SURGERY  x__ Stop Anti-inflammatories such as Advil, Aleve, Ibuprofen, Motrin, Naproxen,          Naprosyn, Goodies powders or aspirin products 7DAYS PRIOR TO SURGERY- Ok to take Tylenol.   ____ Stop supplements until after surgery.    ____ Bring C-Pap to the hospital.

## 2016-05-12 ENCOUNTER — Encounter: Payer: Self-pay | Admitting: *Deleted

## 2016-05-13 ENCOUNTER — Ambulatory Visit: Payer: Medicare HMO | Admitting: Certified Registered Nurse Anesthetist

## 2016-05-13 ENCOUNTER — Encounter: Payer: Self-pay | Admitting: Orthopedic Surgery

## 2016-05-13 ENCOUNTER — Encounter
Admission: RE | Disposition: A | Discharge status: Home / Self Care | Payer: Self-pay | Source: Ambulatory Visit | Attending: Orthopedic Surgery

## 2016-05-13 ENCOUNTER — Ambulatory Visit
Admission: RE | Admit: 2016-05-13 | Discharge: 2016-05-13 | Disposition: A | Discharge status: Home / Self Care | Payer: Medicare HMO | Source: Ambulatory Visit | Attending: Orthopedic Surgery | Admitting: Orthopedic Surgery

## 2016-05-13 DIAGNOSIS — Z885 Allergy status to narcotic agent status: Secondary | ICD-10-CM | POA: Insufficient documentation

## 2016-05-13 DIAGNOSIS — M94261 Chondromalacia, right knee: Secondary | ICD-10-CM | POA: Diagnosis not present

## 2016-05-13 DIAGNOSIS — M23251 Derangement of posterior horn of lateral meniscus due to old tear or injury, right knee: Secondary | ICD-10-CM | POA: Insufficient documentation

## 2016-05-13 DIAGNOSIS — M2391 Unspecified internal derangement of right knee: Secondary | ICD-10-CM | POA: Diagnosis present

## 2016-05-13 DIAGNOSIS — M23241 Derangement of anterior horn of lateral meniscus due to old tear or injury, right knee: Secondary | ICD-10-CM | POA: Diagnosis not present

## 2016-05-13 DIAGNOSIS — S83281A Other tear of lateral meniscus, current injury, right knee, initial encounter: Secondary | ICD-10-CM | POA: Diagnosis not present

## 2016-05-13 DIAGNOSIS — Z880 Allergy status to penicillin: Secondary | ICD-10-CM | POA: Insufficient documentation

## 2016-05-13 DIAGNOSIS — M25461 Effusion, right knee: Secondary | ICD-10-CM | POA: Insufficient documentation

## 2016-05-13 DIAGNOSIS — Z91013 Allergy to seafood: Secondary | ICD-10-CM | POA: Diagnosis not present

## 2016-05-13 DIAGNOSIS — M23211 Derangement of anterior horn of medial meniscus due to old tear or injury, right knee: Secondary | ICD-10-CM | POA: Insufficient documentation

## 2016-05-13 DIAGNOSIS — S83241A Other tear of medial meniscus, current injury, right knee, initial encounter: Secondary | ICD-10-CM | POA: Diagnosis not present

## 2016-05-13 HISTORY — PX: CHONDROPLASTY: SHX5177

## 2016-05-13 HISTORY — PX: KNEE ARTHROSCOPY: SHX127

## 2016-05-13 SURGERY — ARTHROSCOPY, KNEE
Anesthesia: General | Site: Knee | Laterality: Right

## 2016-05-13 MED ORDER — GLYCOPYRROLATE 0.2 MG/ML IJ SOLN
INTRAMUSCULAR | Status: DC | PRN
  Administered 2016-05-13: 0.2 mg via INTRAVENOUS

## 2016-05-13 MED ORDER — FENTANYL CITRATE (PF) 100 MCG/2ML IJ SOLN
25.0000 ug | INTRAMUSCULAR | Status: DC | PRN
  Administered 2016-05-13 (×2): 50 ug via INTRAVENOUS

## 2016-05-13 MED ORDER — MIDAZOLAM HCL 5 MG/5ML IJ SOLN
INTRAMUSCULAR | Status: DC | PRN
  Administered 2016-05-13: 2 mg via INTRAVENOUS

## 2016-05-13 MED ORDER — ACETAMINOPHEN 10 MG/ML IV SOLN
INTRAVENOUS | Status: DC | PRN
  Administered 2016-05-13: 1000 mg via INTRAVENOUS

## 2016-05-13 MED ORDER — FENTANYL CITRATE (PF) 100 MCG/2ML IJ SOLN
INTRAMUSCULAR | Status: DC | PRN
  Administered 2016-05-13 (×4): 50 ug via INTRAVENOUS

## 2016-05-13 MED ORDER — FENTANYL CITRATE (PF) 100 MCG/2ML IJ SOLN
INTRAMUSCULAR | Status: AC
  Filled 2016-05-13: qty 2

## 2016-05-13 MED ORDER — OXYCODONE HCL 5 MG PO TABS: 5.0000 mg | ORAL_TABLET | ORAL | 0 refills | Status: DC | PRN

## 2016-05-13 MED ORDER — ONDANSETRON HCL 4 MG/2ML IJ SOLN
INTRAMUSCULAR | Status: DC | PRN
  Administered 2016-05-13: 4 mg via INTRAVENOUS

## 2016-05-13 MED ORDER — ONDANSETRON HCL 4 MG/2ML IJ SOLN: 4.0000 mg | Freq: Four times a day (QID) | INTRAMUSCULAR | Status: DC | PRN

## 2016-05-13 MED ORDER — CHLORHEXIDINE GLUCONATE 4 % EX LIQD: 60.0000 mL | Freq: Once | CUTANEOUS | Status: DC

## 2016-05-13 MED ORDER — BUPIVACAINE-EPINEPHRINE 0.25% -1:200000 IJ SOLN
INTRAMUSCULAR | Status: DC | PRN
  Administered 2016-05-13: 5 mL
  Administered 2016-05-13: 25 mL

## 2016-05-13 MED ORDER — LACTATED RINGERS IV SOLN
INTRAVENOUS | Status: DC
  Administered 2016-05-13: 15:00:00 via INTRAVENOUS

## 2016-05-13 MED ORDER — ACETAMINOPHEN 10 MG/ML IV SOLN
INTRAVENOUS | Status: AC
  Filled 2016-05-13: qty 100

## 2016-05-13 MED ORDER — FAMOTIDINE 20 MG PO TABS
20.0000 mg | ORAL_TABLET | Freq: Once | ORAL | Status: AC
  Administered 2016-05-13: 20 mg via ORAL

## 2016-05-13 MED ORDER — BUPIVACAINE-EPINEPHRINE (PF) 0.25% -1:200000 IJ SOLN
INTRAMUSCULAR | Status: AC
  Filled 2016-05-13: qty 30

## 2016-05-13 MED ORDER — SODIUM CHLORIDE 0.9 % IV SOLN: INTRAVENOUS | Status: DC

## 2016-05-13 MED ORDER — PROPOFOL 10 MG/ML IV BOLUS
INTRAVENOUS | Status: DC | PRN
  Administered 2016-05-13: 20 mg via INTRAVENOUS
  Administered 2016-05-13: 150 mg via INTRAVENOUS

## 2016-05-13 MED ORDER — ONDANSETRON HCL 4 MG PO TABS: 4.0000 mg | ORAL_TABLET | Freq: Four times a day (QID) | ORAL | Status: DC | PRN

## 2016-05-13 MED ORDER — METOCLOPRAMIDE HCL 5 MG/ML IJ SOLN: 5.0000 mg | Freq: Three times a day (TID) | INTRAMUSCULAR | Status: DC | PRN

## 2016-05-13 MED ORDER — LIDOCAINE HCL (PF) 2 % IJ SOLN
INTRAMUSCULAR | Status: DC | PRN
  Administered 2016-05-13: 50 mg

## 2016-05-13 MED ORDER — MORPHINE SULFATE (PF) 4 MG/ML IV SOLN
INTRAVENOUS | Status: AC
  Filled 2016-05-13: qty 1

## 2016-05-13 MED ORDER — OXYCODONE HCL 5 MG PO TABS: 5.0000 mg | ORAL_TABLET | ORAL | Status: DC | PRN

## 2016-05-13 MED ORDER — DEXAMETHASONE SODIUM PHOSPHATE 10 MG/ML IJ SOLN
INTRAMUSCULAR | Status: DC | PRN
Start: 2016-05-13 — End: 2016-05-13
  Administered 2016-05-13: 5 mg via INTRAVENOUS

## 2016-05-13 MED ORDER — METOCLOPRAMIDE HCL 10 MG PO TABS: 5.0000 mg | ORAL_TABLET | Freq: Three times a day (TID) | ORAL | Status: DC | PRN

## 2016-05-13 SURGICAL SUPPLY — 24 items
BLADE SHAVER 4.5 DBL SERAT CV (CUTTER) ×2 IMPLANT
BNDG ESMARK 6X12 TAN STRL LF (GAUZE/BANDAGES/DRESSINGS) ×2 IMPLANT
CUFF TOURN 24 STER (MISCELLANEOUS) ×2 IMPLANT
CUFF TOURN 30 STER DUAL PORT (MISCELLANEOUS) IMPLANT
DRSG DERMACEA 8X12 NADH (GAUZE/BANDAGES/DRESSINGS) ×2 IMPLANT
DURAPREP 26ML APPLICATOR (WOUND CARE) ×4 IMPLANT
GAUZE SPONGE 4X4 12PLY STRL (GAUZE/BANDAGES/DRESSINGS) ×2 IMPLANT
GLOVE BIOGEL M STRL SZ7.5 (GLOVE) ×2 IMPLANT
GLOVE INDICATOR 8.0 STRL GRN (GLOVE) ×2 IMPLANT
GOWN STRL REUS W/ TWL LRG LVL3 (GOWN DISPOSABLE) ×2 IMPLANT
GOWN STRL REUS W/TWL LRG LVL3 (GOWN DISPOSABLE) ×4
IV LACTATED RINGER IRRG 3000ML (IV SOLUTION) ×12
IV LR IRRIG 3000ML ARTHROMATIC (IV SOLUTION) ×6 IMPLANT
KIT RM TURNOVER STRD PROC AR (KITS) ×2 IMPLANT
MANIFOLD NEPTUNE II (INSTRUMENTS) ×2 IMPLANT
PACK ARTHROSCOPY KNEE (MISCELLANEOUS) ×2 IMPLANT
SET TUBE SUCT SHAVER OUTFL 24K (TUBING) ×2 IMPLANT
SET TUBE TIP INTRA-ARTICULAR (MISCELLANEOUS) ×2 IMPLANT
SUT ETHILON 3-0 FS-10 30 BLK (SUTURE) ×2
SUTURE EHLN 3-0 FS-10 30 BLK (SUTURE) ×1 IMPLANT
TUBING ARTHRO INFLOW-ONLY STRL (TUBING) ×2 IMPLANT
WAND COBLATION FLOW 50 (SURGICAL WAND) ×2 IMPLANT
WAND HAND CNTRL MULTIVAC 50 (MISCELLANEOUS) ×2 IMPLANT
WRAP KNEE W/COLD PACKS 25.5X14 (SOFTGOODS) ×2 IMPLANT

## 2016-05-13 NOTE — Brief Op Note (Signed)
05/13/2016  5:39 PM  PATIENT:  Lisa Yu  65 y.o. female  PRE-OPERATIVE DIAGNOSIS:  INTERNAL DERANGEMENT,ACUTE PAIN RIGHT KNEE  POST-OPERATIVE DIAGNOSIS:  tear anterior horn medial meniscus, anterior and posterior horn lateral meniscus, grade 3 chondromalacia medial compartment  PROCEDURE:  Procedure(s): ARTHROSCOPY KNEE WITH MEDIAL AND LATERAL MENISECTOMY (Right) CHONDROPLASTY (Right)  SURGEON:  Surgeon(s) and Role:    * Dereck Leep, MD - Primary  ASSISTANTS: none   ANESTHESIA:   general  EBL:  Total I/O In: 700 [I.V.:700] Out: -   BLOOD ADMINISTERED:none  DRAINS: none   LOCAL MEDICATIONS USED:  MARCAINE     SPECIMEN:  No Specimen  DISPOSITION OF SPECIMEN:  N/A  COUNTS:  YES  TOURNIQUET:   not used  DICTATION: .Sales executive  PLAN OF CARE: Discharge to home after PACU  PATIENT DISPOSITION:  PACU - hemodynamically stable.   Delay start of Pharmacological VTE agent (>24hrs) due to surgical blood loss or risk of bleeding: not applicable

## 2016-05-13 NOTE — Anesthesia Procedure Notes (Signed)
Procedure Name: LMA Insertion Performed by: Wojciech Willetts Pre-anesthesia Checklist: Patient identified, Patient being monitored, Timeout performed, Emergency Drugs available and Suction available Patient Re-evaluated:Patient Re-evaluated prior to inductionOxygen Delivery Method: Circle system utilized Preoxygenation: Pre-oxygenation with 100% oxygen Intubation Type: IV induction Ventilation: Mask ventilation without difficulty LMA: LMA inserted LMA Size: 3.5 Tube type: Oral Number of attempts: 1 Placement Confirmation: positive ETCO2 and breath sounds checked- equal and bilateral Tube secured with: Tape Dental Injury: Teeth and Oropharynx as per pre-operative assessment        

## 2016-05-13 NOTE — H&P (Signed)
The patient has been re-examined, and the chart reviewed, and there have been no interval changes to the documented history and physical.    The risks, benefits, and alternatives have been discussed at length. The patient expressed understanding of the risks benefits and agreed with plans for surgical intervention.  James P. Hooten, Jr. M.D.    

## 2016-05-13 NOTE — Transfer of Care (Signed)
Immediate Anesthesia Transfer of Care Note  Patient: Lisa Yu  Procedure(s) Performed: Procedure(s): ARTHROSCOPY KNEE WITH MEDIAL AND LATERAL MENISECTOMY (Right) CHONDROPLASTY (Right)  Patient Location: PACU  Anesthesia Type:General  Level of Consciousness: sedated  Airway & Oxygen Therapy: Patient Spontanous Breathing and Patient connected to face mask oxygen  Post-op Assessment: Report given to RN and Post -op Vital signs reviewed and stable  Post vital signs: Reviewed  Last Vitals:  Vitals:   05/13/16 1443 05/13/16 1731  BP: (!) 160/76 114/70  Pulse: 69 79  Resp: 16 (!) 8  Temp: 36.6 C     Last Pain:  Vitals:   05/13/16 1443  TempSrc: Oral  PainSc: 5          Complications: No apparent anesthesia complications

## 2016-05-13 NOTE — Op Note (Signed)
OPERATIVE NOTE  DATE OF SURGERY:  05/13/2016  PATIENT NAME:  Lisa Yu   DOB: April 21, 1951  MRN: [id number]   PRE-OPERATIVE DIAGNOSIS:  Internal derangement of the right knee   POST-OPERATIVE DIAGNOSIS:   Tear of the anterior horn of the medial meniscus, right knee Tear of the anterior and posterior horns of the lateral meniscus, right knee Grade 3 chondromalacia of the medial femoral condyle, right knee  PROCEDURE:  Right knee arthroscopy, partial medial and lateral meniscectomies, and chondroplasty  SURGEON:  Marciano Sequin., M.D.   ASSISTANT: none  ANESTHESIA: general  ESTIMATED BLOOD LOSS: Minimal  FLUIDS REPLACED: 700 mL of crystalloid  TOURNIQUET TIME: Not used   DRAINS: none  IMPLANTS UTILIZED: None  INDICATIONS FOR SURGERY: Lisa Yu is a 65 y.o. year old female who has been seen for complaints of right knee pain. MRI demonstrated findings consistent with meniscal pathology. After discussion of the risks and benefits of surgical intervention, the patient expressed understanding of the risks benefits and agree with plans for right knee arthroscopy.   PROCEDURE IN DETAIL: The patient was brought into the operating room and, after adequate general anesthesia was achieved, a tourniquet was applied to the right thigh and the leg was placed in the leg holder. All bony prominences were well padded. The patient's right knee was cleaned and prepped with alcohol and Duraprep and draped in the usual sterile fashion. A "timeout" was performed as per usual protocol. The anticipated portal sites were injected with 0.25% Marcaine with epinephrine. An anterolateral incision was made and a cannula was inserted. A small effusion was evacuated and the knee was distended with fluid using the pump. The scope was advanced down the medial gutter into the medial compartment. Under visualization with the scope, an anteromedial portal was created and a hooked probe was inserted. The  medial meniscus was visualized and probed. There was a complex tear of the anterior horn of the medial meniscus. The tear was debrided using meniscal punches and a 4.5 mm shaver with final contouring performed using the ArthroCare wand. The remaining rim of meniscus was visualized and probed and felt to be stable. The articular cartilage was visualized. There was grade 3 chondromalacia involving the medial femoral condyle. These areas involved were debrided and contoured using the ArthroCare wand.  The scope was then advanced into the intercondylar notch. The anterior cruciate ligament was visualized and probed and felt to be intact. The scope was removed from the lateral portal and reinserted via the anteromedial portal to better visualize the lateral compartment. The lateral meniscus was visualized and probed. There was a complex degenerative tear of the anterior horn of the lateral meniscus with extension and a horizontal fashion into the posterior horn. The tear was debrided using combination of meniscal punches and a 4.5 mm shaver. Final contouring was performed using the ArthroCare wand. The remaining rim meniscus was visualized and probed for be stable. The articular cartilage of the lateral compartment was visualized and noted to be in good condition. Finally, the scope was advanced so as to visualize the patellofemoral articulation. Good patellar tracking was appreciated.   The knee was irrigated with copius amounts of fluid and suctioned dry. The anterolateral portal was re-approximated with #3-0 nylon. A combination of 0.25% Marcaine with epinephrine and 4 mg of Morphine were injected via the scope. The scope was removed and the anteromedial portal was re-approximated with #3-0 nylon. A sterile dressing was applied followed by application of an  ice wrap.  The patient tolerated the procedure well and was transported to the PACU in stable condition.  Stefan Karen P. Holley Bouche., M.D.

## 2016-05-13 NOTE — Anesthesia Preprocedure Evaluation (Signed)
Anesthesia Evaluation  Patient identified by MRN, date of birth, ID band Patient awake    Reviewed: Allergy & Precautions, H&P , NPO status , Patient's Chart, lab work & pertinent test results  History of Anesthesia Complications Negative for: history of anesthetic complications  Airway Mallampati: II  TM Distance: <3 FB Neck ROM: full    Dental  (+) Poor Dentition, Chipped   Pulmonary neg pulmonary ROS, neg shortness of breath,    Pulmonary exam normal breath sounds clear to auscultation       Cardiovascular Exercise Tolerance: Good (-) angina(-) Past MI and (-) DOE negative cardio ROS Normal cardiovascular exam Rhythm:regular Rate:Normal     Neuro/Psych  Headaches, Seizures -, Well Controlled,  negative psych ROS   GI/Hepatic negative GI ROS, Neg liver ROS, neg GERD  ,  Endo/Other  negative endocrine ROS  Renal/GU      Musculoskeletal  (+) Arthritis ,   Abdominal   Peds  Hematology negative hematology ROS (+)   Anesthesia Other Findings Past Medical History: No date: Diverticulosis 12/21/2007: Osteopenia No date: Seizures ([place])     Comment: X 1 IN 2009  Past Surgical History: 1998: AUGMENTATION MAMMAPLASTY Bilateral     Comment: removed 2013 1983: BREAST SURGERY     Comment: bilateral implants 1999: cervical disease     Comment: H/O Cryo 05/01/2005: COLONOSCOPY No date: FOOT SURGERY Bilateral 2007: LASIK No date: MOUTH SURGERY     Comment: DENTAL IMPLANTS (TOP) No date: VAGINAL DELIVERY     Comment: x 1  BMI    Body Mass Index:  28.62 kg/m      Reproductive/Obstetrics negative OB ROS                             Anesthesia Physical Anesthesia Plan  ASA: III  Anesthesia Plan: General LMA   Post-op Pain Management:    Induction:   Airway Management Planned:   Additional Equipment:   Intra-op Plan:   Post-operative Plan:   Informed Consent: I have  reviewed the patients History and Physical, chart, labs and discussed the procedure including the risks, benefits and alternatives for the proposed anesthesia with the patient or authorized representative who has indicated his/her understanding and acceptance.   Dental Advisory Given  Plan Discussed with: Anesthesiologist, CRNA and Surgeon  Anesthesia Plan Comments:         Anesthesia Quick Evaluation

## 2016-05-13 NOTE — Discharge Instructions (Signed)
°  Instructions after Knee Arthroscopy  ° °- James P. Hooten, Jr., M.D.    ° Dept. of Orthopaedics & Sports Medicine ° [facility] ° [address] ° [place], [place]  [address] ° ° Phone: [phone]   Fax: [phone] ° ° °DIET: °• Drink plenty of non-alcoholic fluids & begin a light diet. °• Resume your normal diet the day after surgery. ° °ACTIVITY:  °• You may use crutches or a walker with weight-bearing as tolerated, unless instructed otherwise. °• You may wean yourself off of the walker or crutches as tolerated.  °• Begin doing gentle exercises. Exercising will reduce the pain and swelling, increase motion, and prevent muscle weakness.   °• Avoid strenuous activities or athletics for a minimum of 4-6 weeks after arthroscopic surgery. °• Do not drive or operate any equipment until instructed. ° °WOUND CARE:  °• Place one to two pillows under the knee the first day or two when sitting or lying.  °• Continue to use the ice packs periodically to reduce pain and swelling. °• The small incisions in your knee are closed with nylon stitches. The stitches will be removed in the office. °• The bulky dressing may be removed on the second day after surgery. DO NOT TOUCH THE STITCHES. Put a Band-Aid over each stitch. Do NOT use any ointments or creams on the incisions.  °• You may bathe or shower after the stitches are removed at the first office visit following surgery. ° °MEDICATIONS: °• You may resume your regular medications. °• Please take the pain medication as prescribed. °• Do not take pain medication on an empty stomach. °• Do not drive or drink alcoholic beverages when taking pain medications. ° °CALL THE OFFICE FOR: °• Temperature above 101 degrees °• Excessive bleeding or drainage on the dressing. °• Excessive swelling, coldness, or paleness of the toes. °• Persistent nausea and vomiting. ° °FOLLOW-UP:  °• You should have an appointment to return to the office in 7-10 days after surgery.   ° ° °AMBULATORY SURGERY  °DISCHARGE INSTRUCTIONS ° ° °1) The drugs that you were given will stay in your system until tomorrow so for the next 24 hours you should not: ° °A) Drive an automobile °B) Make any legal decisions °C) Drink any alcoholic beverage ° ° °2) You may resume regular meals tomorrow.  Today it is better to start with liquids and gradually work up to solid foods. ° °You may eat anything you prefer, but it is better to start with liquids, then soup and crackers, and gradually work up to solid foods. ° ° °3) Please notify your doctor immediately if you have any unusual bleeding, trouble breathing, redness and pain at the surgery site, drainage, fever, or pain not relieved by medication. ° ° ° °4) Additional Instructions: ° ° ° ° ° ° ° °Please contact your physician with any problems or Same Day Surgery at [phone], Monday through Friday 6 am to 4 pm, or [facility] number at [phone]. ° °

## 2016-05-13 NOTE — Anesthesia Postprocedure Evaluation (Signed)
Anesthesia Post Note  Patient: Lisa Yu  Procedure(s) Performed: Procedure(s) (LRB): ARTHROSCOPY KNEE WITH MEDIAL AND LATERAL MENISECTOMY (Right) CHONDROPLASTY (Right)  Patient location during evaluation: PACU Anesthesia Type: General Level of consciousness: awake and alert Pain management: pain level controlled Vital Signs Assessment: post-procedure vital signs reviewed and stable Respiratory status: spontaneous breathing, nonlabored ventilation and respiratory function stable Cardiovascular status: blood pressure returned to baseline and stable Postop Assessment: no signs of nausea or vomiting Anesthetic complications: no    Last Vitals:  Vitals:   05/13/16 1817 05/13/16 1831  BP: (!) 158/87 (!) 151/83  Pulse: 72 76  Resp: 16 16  Temp: 36.7 C 36.8 C    Last Pain:  Vitals:   05/13/16 1831  TempSrc: Temporal  PainSc: 5                  Martha Clan

## 2016-05-13 NOTE — OR Nursing (Signed)
Patient desires discharge, clarified allergies with Dr Marry Guan for discharge pain meds. No new orders at this time.

## 2016-05-14 ENCOUNTER — Encounter: Payer: Self-pay | Admitting: Orthopedic Surgery

## 2016-06-23 DIAGNOSIS — Z96651 Presence of right artificial knee joint: Secondary | ICD-10-CM | POA: Diagnosis not present

## 2016-08-03 ENCOUNTER — Ambulatory Visit: Payer: Medicare HMO | Admitting: Nurse Practitioner

## 2016-08-11 ENCOUNTER — Ambulatory Visit (INDEPENDENT_AMBULATORY_CARE_PROVIDER_SITE_OTHER): Payer: Medicare HMO | Admitting: Nurse Practitioner

## 2016-08-11 ENCOUNTER — Encounter: Payer: Self-pay | Admitting: Nurse Practitioner

## 2016-08-11 VITALS — BP 137/78 | HR 71 | Ht 65.5 in | Wt 178.0 lb

## 2016-08-11 DIAGNOSIS — G40209 Localization-related (focal) (partial) symptomatic epilepsy and epileptic syndromes with complex partial seizures, not intractable, without status epilepticus: Secondary | ICD-10-CM | POA: Diagnosis not present

## 2016-08-11 MED ORDER — LAMOTRIGINE 200 MG PO TABS: 200.0000 mg | ORAL_TABLET | Freq: Two times a day (BID) | ORAL | 3 refills | Status: DC

## 2016-08-11 NOTE — Progress Notes (Signed)
[name] DOB: 12-05-1950   REASON FOR VISIT: Complex partial seizures HISTORY FROM: Patient    HISTORY OF PRESENT ILLNESS:Lisa Yu, is a 66 year-old Caucasian female with history of complex partial seizures with secondary generalization. She is here for yearly follow-up  She is taking and tolerating Generic  Lamictal  200mg  BID. Her last seizure occurred in July, 2010, on a day of traveling. She says that she was tired and experienced a funny feeling like when she had seizures in the past, but no overt seizure activity. She has had no further auras or seizures. She is retired and keeps her grandchildren. Yearly labs are followed by her primary care. She had right knee surgery in October and continues to have some right knee pain. No new neurological complaints.   UPDATE Oct 15th 2015: She has has no recurrent seizure, is taking brand name Lamictal xr 100mg  4 tabs qhs. She is worried about the medication cost, open to option of generic, She still drives.  UPDATE Jan 19th 2016: Left parietal area intermittent transient sharp pain, lasting for few second, mild headache afterwards, she tolerating generic lamotrigine xr 100mg  4 tabs well. Level was 11.8.    REVIEW OF SYSTEMS: Full 14 system review of systems performed and notable only for those listed, all others are neg:  Constitutional: neg  Cardiovascular: neg Ear/Nose/Throat: neg  Skin: neg Eyes: neg Respiratory: neg Gastroitestinal: neg  Hematology/Lymphatic: neg  Endocrine: neg Musculoskeletal:neg Allergy/Immunology: neg Neurological: neg Psychiatric: neg Sleep : neg   ALLERGIES: Allergies  Allergen Reactions  . Shellfish Allergy Swelling and Rash  . Hydrocodone-Homatropine Hives and Itching  . Penicillins     REACTION: rash    HOME MEDICATIONS: Outpatient Medications Prior to Visit  Medication Sig Dispense Refill  . aspirin 81 MG tablet Take 81 mg by mouth daily.       . Calcium Carbonate-Vitamin D (TH CALCIUM CARBONATE-VITAMIN D) 600-400 MG-UNIT per tablet Take 1 tablet by mouth.     . lamoTRIgine (LAMICTAL) 200 MG tablet Take 1 tablet (200 mg total) by mouth 2 (two) times daily. (Patient taking differently: Take 200 mg by mouth 2 (two) times daily. 2 IN AM AND 2 IN PM) 60 tablet 11  . Multiple Vitamin (MULTIVITAMIN) tablet Take 1 tablet by mouth daily.      Marland Kitchen oxyCODONE (ROXICODONE) 5 MG immediate release tablet Take 1-2 tablets (5-10 mg total) by mouth every 4 (four) hours as needed for severe pain. 40 tablet 0  . fluticasone (FLONASE) 50 MCG/ACT nasal spray Place 2 sprays into the nose daily as needed. Reported on 12/27/2015     No facility-administered medications prior to visit.     PAST MEDICAL HISTORY: Past Medical History:  Diagnosis Date  . Diverticulosis   . Osteopenia 12/21/2007  . Seizures ([place])    X 1 IN 2009    PAST SURGICAL HISTORY: Past Surgical History:  Procedure Laterality Date  . AUGMENTATION MAMMAPLASTY Bilateral 1998   removed 2013  . BREAST SURGERY  1983   bilateral implants  . cervical disease  1999   H/O Cryo  . CHONDROPLASTY Right 05/13/2016   Procedure: CHONDROPLASTY;  Surgeon: Dereck Leep, MD;  Location: [facility] ORS;  Service: Orthopedics;  Laterality: Right;  . COLONOSCOPY  05/01/2005  . FOOT SURGERY Bilateral   . KNEE ARTHROSCOPY Right 05/13/2016   Procedure: ARTHROSCOPY KNEE WITH MEDIAL AND LATERAL MENISECTOMY;  Surgeon: Dereck Leep, MD;  Location: [facility] ORS;  Service: Orthopedics;  Laterality: Right;  . LASIK  2007  . MOUTH SURGERY     DENTAL IMPLANTS (TOP)  . VAGINAL DELIVERY     x 1    FAMILY HISTORY: Family History  Problem Relation Age of Onset  . Cancer Mother     skin    SOCIAL HISTORY: Social History   Social History  . Marital status: Married    Spouse name: Ernie Hew  . Number of children: 1  . Years of education: 64 th   Occupational History  . Retired from [employer]  . Smoking status: Never Smoker  . Smokeless tobacco: Never Used  . Alcohol use No  . Drug use: No  . Sexual activity: Not on file   Other Topics Concern  . Not on file   Social History Narrative   Patient lives at home with her husband Ernie Hew).   Retired and she is self employed.   Education 12 th    Right handed.   Caffeine two cups of coffee one soda daily.      Would desire CPR, no prolonged life support if futile.     PHYSICAL EXAM  Vitals:   08/11/16 0758  BP: 137/78  Pulse: 71  Weight: 178 lb (80.7 kg)  Height: 5' 5.5" (1.664 m)   Body mass index is 29.17 kg/m. Generalized: Well developed, in no acute distress  Head: normocephalic and atraumatic,. Oropharynx benign  Musculoskeletal: No deformity   Neurological examination   Mentation: Alert oriented to time, place, history taking. Follows all commands speech and language fluent  Cranial nerve II-XII: .Pupils were equal round reactive to light extraocular movements were full, visual field were full on confrontational test. Facial sensation and strength were normal. hearing was intact to finger rubbing bilaterally. Uvula tongue midline. head turning and shoulder shrug and were normal and symmetric.Tongue protrusion into cheek strength was normal. Motor: normal bulk and tone, full strength in the BUE, BLE, fine finger movements normal, no pronator drift. No focal weakness Sensory: normal and symmetric to light touch, pinprick, and vibration In the upper and lower extremities Coordination: finger-nose-finger, heel-to-shin bilaterally, no dysmetria Reflexes: 1+ upper, lower and symmetric Gait and Station: Rising up from seated position without assistance, normal stance, moderate stride, good arm swing, smooth turning, able to perform tiptoe, and heel walking without difficulty. Tandem steady.    DIAGNOSTIC DATA (LABS, IMAGING, TESTING) - I reviewed patient records, labs, notes, testing and  imaging myself where available.  Lab Results  Component Value Date   WBC 7.2 05/14/2015   HGB 13.2 05/14/2015   HCT 40.4 05/14/2015   MCV 90.0 05/14/2015   PLT 321.0 05/14/2015      Component Value Date/Time   NA 141 05/14/2015 0834   K 4.4 05/14/2015 0834   CL 105 05/14/2015 0834   CO2 31 05/14/2015 0834   GLUCOSE 96 05/14/2015 0834   BUN 21 05/14/2015 0834   CREATININE 0.64 05/14/2015 0834   CALCIUM 9.7 05/14/2015 0834   PROT 6.9 05/14/2015 0834   ALBUMIN 4.2 05/14/2015 0834   AST 21 05/14/2015 0834   ALT 20 05/14/2015 0834   ALKPHOS 52 05/14/2015 0834   BILITOT 0.5 05/14/2015 0834   GFRNONAA 87.85 12/31/2009 0953   GFRAA 111 12/21/2007 0816   Lab Results  Component Value Date   CHOL 186 05/14/2015   HDL 67.10 05/14/2015   LDLCALC 109 (H) 05/14/2015   LDLDIRECT 139.7 01/29/2011  TRIG 48.0 05/14/2015   CHOLHDL 3 05/14/2015    Lab Results  Component Value Date   TSH 2.50 05/14/2015      ASSESSMENT AND PLAN  66 y.o. year old female  has a past medical history of Seizures ([place]). here to follow-up. Last seizure activity 2010.  Continue Lamictal at current dose will refill Call for any seizure activity Follow-up yearly and when necessary Dennie Bible, [facility], [facility], [facility] [address], [place], [place] [address] [phone]

## 2016-08-11 NOTE — Patient Instructions (Signed)
Continue Lamictal at current dose will refill Call for any seizure activity Follow-up yearly and when necessary next with Krista Blue

## 2016-08-14 NOTE — Progress Notes (Signed)
I have reviewed and agreed above plan. 

## 2016-08-31 ENCOUNTER — Encounter: Payer: Self-pay | Admitting: Internal Medicine

## 2016-08-31 ENCOUNTER — Ambulatory Visit (INDEPENDENT_AMBULATORY_CARE_PROVIDER_SITE_OTHER): Payer: Medicare HMO | Admitting: Internal Medicine

## 2016-08-31 VITALS — BP 130/80 | HR 74 | Temp 97.7°F | Wt 176.0 lb

## 2016-08-31 DIAGNOSIS — J209 Acute bronchitis, unspecified: Secondary | ICD-10-CM

## 2016-08-31 MED ORDER — AZITHROMYCIN 250 MG PO TABS: ORAL_TABLET | ORAL | 0 refills | Status: DC

## 2016-08-31 MED ORDER — PREDNISONE 10 MG PO TABS: ORAL_TABLET | ORAL | 0 refills | Status: DC

## 2016-08-31 NOTE — Progress Notes (Signed)
HPI  Pt presents to the clinic today with c/o runny nose and cough. This started 10 days ago. She is blowing clear mucous out of her nose. The cough is productive of yellow/green mucous. She has been wheezing. She denies ear pain, sore throat or shortness of breath. She denies fever, chills or body aches. She has tried Mucinex and Nyquil with some relief. She has no history of allergies or breathing problems. She has had sick contacts.  Review of Systems        Past Medical History:  Diagnosis Date  . Diverticulosis   . Osteopenia 12/21/2007  . Seizures ([place])    X 1 IN 2009    Family History  Problem Relation Age of Onset  . Cancer Mother     skin    Social History   Social History  . Marital status: Married    Spouse name: Ernie Hew  . Number of children: 1  . Years of education: 55 th   Occupational History  . Retired from [employer] Topics  . Smoking status: Never Smoker  . Smokeless tobacco: Never Used  . Alcohol use No  . Drug use: No  . Sexual activity: Not on file   Other Topics Concern  . Not on file   Social History Narrative   Patient lives at home with her husband Ernie Hew).   Retired and she is self employed.   Education 12 th    Right handed.   Caffeine two cups of coffee one soda daily.      Would desire CPR, no prolonged life support if futile.    Allergies  Allergen Reactions  . Shellfish Allergy Swelling and Rash  . Hydrocodone-Homatropine Hives and Itching  . Penicillins     REACTION: rash     Constitutional: Denies headache, fatigue, fever or abrupt weight changes.  HEENT:  Positive runny nose. Denies eye redness, eye pain, pressure behind the eyes, facial pain, nasal congestion, ear pain, ringing in the ears, wax buildup, or bloody nose. Respiratory: Positive cough. Denies difficulty breathing or shortness of breath.  Cardiovascular: Denies chest pain, chest tightness, palpitations or swelling in the hands or feet.    No other specific complaints in a complete review of systems (except as listed in HPI above).  Objective:   BP 130/80 (BP Location: Right Arm, Patient Position: Sitting, Cuff Size: Normal)   Pulse 74   Temp 97.7 F (36.5 C) (Oral)   Wt 176 lb (79.8 kg)   SpO2 97%   BMI 28.84 kg/m  Wt Readings from Last 3 Encounters:  08/31/16 176 lb (79.8 kg)  08/11/16 178 lb (80.7 kg)  05/13/16 180 lb (81.6 kg)     General: Appears her stated age, ill appearing, in NAD. HEENT: Head: normal shape and size; Ears: Tm's gray and intact, normal light reflex; Nose: mucosa pink and moist, septum midline; Throat/Mouth: Teeth present, mucosa pink and moist, no exudate noted, no lesions or ulcerations noted.  Neck: No cervical lymphadenopathy.  Cardiovascular: Normal rate and rhythm. Pulmonary/Chest: Normal effort with scattered rhonchi and bilateral expiratory wheezing noted. No respiratory distress.       Assessment & Plan:   Acute Bronchitis:  Get some rest and drink plenty of water eRx for Azithromax x 5 days eRx for Pred Taper x 6 days Continue Nyquil for cough  RTC as needed or if symptoms persist.   Webb Silversmith, NP

## 2016-08-31 NOTE — Patient Instructions (Signed)

## 2016-09-03 DIAGNOSIS — M25561 Pain in right knee: Secondary | ICD-10-CM | POA: Diagnosis not present

## 2016-09-03 DIAGNOSIS — M659 Synovitis and tenosynovitis, unspecified: Secondary | ICD-10-CM | POA: Diagnosis not present

## 2016-09-03 DIAGNOSIS — G8929 Other chronic pain: Secondary | ICD-10-CM | POA: Diagnosis not present

## 2016-09-03 DIAGNOSIS — Z9889 Other specified postprocedural states: Secondary | ICD-10-CM | POA: Diagnosis not present

## 2016-12-03 ENCOUNTER — Telehealth: Payer: Self-pay

## 2016-12-03 NOTE — Telephone Encounter (Signed)
Patient is on the list for [employer] 2018 and may be a good candidate for an AWV. Please let me know if/when appt is scheduled.   

## 2016-12-25 ENCOUNTER — Ambulatory Visit (INDEPENDENT_AMBULATORY_CARE_PROVIDER_SITE_OTHER): Payer: Medicare HMO

## 2016-12-25 ENCOUNTER — Ambulatory Visit (INDEPENDENT_AMBULATORY_CARE_PROVIDER_SITE_OTHER): Payer: Medicare HMO | Admitting: Podiatry

## 2016-12-25 DIAGNOSIS — S92912A Unspecified fracture of left toe(s), initial encounter for closed fracture: Secondary | ICD-10-CM

## 2016-12-26 NOTE — Progress Notes (Signed)
   HPI: Patient is a 66 year old female presenting today with a complaint of pain to the fourth left toe that began 2 weeks ago. She states she stumped the toe which caused the pain to begin. Wearing shoes increases the pain. She denies alleviating factors. She has not done anything to treat the area. She is here for further evaluation and treatment.   Physical Exam: General: The patient is alert and oriented x3 in no acute distress.  Dermatology: Skin is warm, dry and supple bilateral lower extremities. Negative for open lesions or macerations.  Vascular: Palpable pedal pulses bilaterally. No edema or erythema noted. Capillary refill within normal limits.  Neurological: Epicritic and protective threshold grossly intact bilaterally.   Musculoskeletal Exam: Pain with palpation to the fourth left toe. Range of motion within normal limits to all pedal and ankle joints bilateral. Muscle strength 5/5 in all groups bilateral.   Radiographic Exam:  Normal osseous mineralization. Joint spaces preserved. No fracture/dislocation/boney destruction.    Assessment: 1. Left fourth toe injury-improving   Plan of Care:  1. Patient was evaluated. X-rays reviewed. 2. Recommended postop shoe for 2 weeks. Patient has one at home. 3. Return to clinic when necessary.   Edrick Kins, DPM [facility]  Dr. Edrick Kins, [address]                                        [place], [place] [address]                Office [phone]  Fax [phone]

## 2016-12-28 DIAGNOSIS — R69 Illness, unspecified: Secondary | ICD-10-CM | POA: Diagnosis not present

## 2017-01-04 ENCOUNTER — Ambulatory Visit (INDEPENDENT_AMBULATORY_CARE_PROVIDER_SITE_OTHER): Payer: Medicare HMO | Admitting: Family Medicine

## 2017-01-04 ENCOUNTER — Encounter: Payer: Self-pay | Admitting: Family Medicine

## 2017-01-04 DIAGNOSIS — F4320 Adjustment disorder, unspecified: Secondary | ICD-10-CM

## 2017-01-04 DIAGNOSIS — R69 Illness, unspecified: Secondary | ICD-10-CM | POA: Diagnosis not present

## 2017-01-04 DIAGNOSIS — F4321 Adjustment disorder with depressed mood: Secondary | ICD-10-CM | POA: Insufficient documentation

## 2017-01-04 MED ORDER — LORAZEPAM 0.5 MG PO TABS: 0.5000 mg | ORAL_TABLET | Freq: Two times a day (BID) | ORAL | 0 refills | Status: DC | PRN

## 2017-01-04 NOTE — Assessment & Plan Note (Signed)
New- very appropriate. >25 minutes spent in face to face time with patient, >50% spent in counselling or coordination of care  Advised her to seek out grief counseling with hospice- this is something she was already considering.  Also provided her temporary supply of low dose Ativan for panic and insomnia. Discussed this is temporary and is to be used sparingly.

## 2017-01-04 NOTE — Progress Notes (Signed)
Subjective:   Patient ID: Lisa Yu, female    DOB: [birth year], 66 y.o.   MRN: [id number]  Lisa Yu is a pleasant 66 y.o. year old female who presents to clinic today with [place]  on 01-06-17  HPI:  Her mom died 3 weeks ago.  She was 97 and in great health until she fell and broke her hip a couple of months ago.  She lived next door to her, very close. Hospice was involved in her care for the last couple of weeks of her life. Lisa Yu has not yet seen a Barrister's clerk.  She is here today because her sisters and friends think she is "crying too much." She is also not sleeping well.  Denies SI or HI.  Does have a sense of panic at times when she thinks about never seeing her mom again.  Appetite is "too good."  Wt Readings from Last 3 Encounters:  06-Jan-2017 173 lb (78.5 kg)  08/31/16 176 lb (79.8 kg)  08/11/16 178 lb (80.7 kg)   She is looking forward to a trip to [place] at the end of the summer with her sisters.  Current Outpatient Prescriptions on File Prior to Visit  Medication Sig Dispense Refill  . aspirin 81 MG tablet Take 81 mg by mouth daily.      . Calcium Carbonate-Vitamin D (TH CALCIUM CARBONATE-VITAMIN D) 600-400 MG-UNIT per tablet Take 1 tablet by mouth.     . lamoTRIgine (LAMICTAL) 200 MG tablet Take 1 tablet (200 mg total) by mouth 2 (two) times daily. 180 tablet 3  . Multiple Vitamin (MULTIVITAMIN) tablet Take 1 tablet by mouth daily.      . fluticasone (FLONASE) 50 MCG/ACT nasal spray Place 2 sprays into the nose daily as needed. Reported on 12/27/2015     No current facility-administered medications on file prior to visit.     Allergies  Allergen Reactions  . Shellfish Allergy Swelling and Rash  . Hydrocodone-Homatropine Hives and Itching  . Penicillins     REACTION: rash    Past Medical History:  Diagnosis Date  . Diverticulosis   . Osteopenia 12/21/2007  . Seizures ([place])    X 1 IN 2009    Past Surgical History:    Procedure Laterality Date  . AUGMENTATION MAMMAPLASTY Bilateral 1998   removed 2013  . BREAST SURGERY  1983   bilateral implants  . cervical disease  1999   H/O Cryo  . CHONDROPLASTY Right 05/13/2016   Procedure: CHONDROPLASTY;  Surgeon: Dereck Leep, MD;  Location: [facility] ORS;  Service: Orthopedics;  Laterality: Right;  . COLONOSCOPY  05/01/2005  . FOOT SURGERY Bilateral   . KNEE ARTHROSCOPY Right 05/13/2016   Procedure: ARTHROSCOPY KNEE WITH MEDIAL AND LATERAL MENISECTOMY;  Surgeon: Dereck Leep, MD;  Location: [facility] ORS;  Service: Orthopedics;  Laterality: Right;  . LASIK  2007  . MOUTH SURGERY     DENTAL IMPLANTS (TOP)  . VAGINAL DELIVERY     x 1    Family History  Problem Relation Age of Onset  . Cancer Mother        skin    Social History   Social History  . Marital status: Married    Spouse name: Ernie Hew  . Number of children: 1  . Years of education: 53 th   Occupational History  . Retired from [employer]  . Smoking status: Never Smoker  . Smokeless tobacco:  Never Used  . Alcohol use No  . Drug use: No  . Sexual activity: Not on file   Other Topics Concern  . Not on file   Social History Narrative   Patient lives at home with her husband Ernie Hew).   Retired and she is self employed.   Education 12 th    Right handed.   Caffeine two cups of coffee one soda daily.      Would desire CPR, no prolonged life support if futile.   The PMH, PSH, Social History, Family History, Medications, and allergies have been reviewed in [facility], and have been updated if relevant.   Review of Systems  Psychiatric/Behavioral: Positive for dysphoric mood and sleep disturbance. Negative for agitation, behavioral problems, confusion, decreased concentration, hallucinations, self-injury and suicidal ideas. The patient is nervous/anxious. The patient is not hyperactive.   All other systems reviewed and are negative.      Objective:    BP (!)  150/80   Pulse 75   Wt 173 lb (78.5 kg)   SpO2 98%   BMI 28.35 kg/m    Physical Exam  Constitutional: She is oriented to person, place, and time. She appears well-developed and well-nourished. No distress.  HENT:  Head: Normocephalic and atraumatic.  Eyes: Conjunctivae are normal.  Cardiovascular: Normal rate.   Pulmonary/Chest: Effort normal.  Musculoskeletal: Normal range of motion. She exhibits no edema.  Neurological: She is alert and oriented to person, place, and time. No cranial nerve deficit.  Skin: Skin is warm and dry. She is not diaphoretic.  Psychiatric:  Tearful, good eye contact, appropriate  Nursing note and vitals reviewed.         Assessment & Plan:   Grief No Follow-up on file.

## 2017-01-07 ENCOUNTER — Telehealth: Payer: Self-pay

## 2017-01-07 ENCOUNTER — Ambulatory Visit: Payer: Medicare HMO | Admitting: Family Medicine

## 2017-01-07 ENCOUNTER — Ambulatory Visit (HOSPITAL_COMMUNITY)
Admission: RE | Admit: 2017-01-07 | Discharge: 2017-01-07 | Disposition: A | Discharge status: Home / Self Care | Payer: Medicare HMO | Source: Ambulatory Visit | Attending: Cardiology | Admitting: Cardiology

## 2017-01-07 ENCOUNTER — Ambulatory Visit (INDEPENDENT_AMBULATORY_CARE_PROVIDER_SITE_OTHER)
Admission: RE | Admit: 2017-01-07 | Discharge: 2017-01-07 | Disposition: A | Discharge status: Home / Self Care | Payer: Medicare HMO | Source: Ambulatory Visit | Attending: Family Medicine | Admitting: Family Medicine

## 2017-01-07 ENCOUNTER — Ambulatory Visit (INDEPENDENT_AMBULATORY_CARE_PROVIDER_SITE_OTHER): Payer: Medicare HMO | Admitting: Family Medicine

## 2017-01-07 ENCOUNTER — Encounter: Payer: Self-pay | Admitting: Family Medicine

## 2017-01-07 VITALS — BP 142/80 | HR 66 | Ht 65.5 in | Wt 172.0 lb

## 2017-01-07 DIAGNOSIS — M79661 Pain in right lower leg: Secondary | ICD-10-CM | POA: Diagnosis not present

## 2017-01-07 DIAGNOSIS — M25561 Pain in right knee: Secondary | ICD-10-CM

## 2017-01-07 NOTE — Progress Notes (Signed)
Pre visit review using our clinic review tool, if applicable. No additional management support is needed unless otherwise documented below in the visit note. 

## 2017-01-07 NOTE — Patient Instructions (Signed)
Good to see you. Please stop by to see Lisa Yu on your way out. 

## 2017-01-07 NOTE — Telephone Encounter (Signed)
She is followed by ortho and needs to be managed by them.  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

## 2017-01-07 NOTE — Telephone Encounter (Signed)
Pt left v/m requesting cb from Dr Deborra Medina; pt just left [place] and was told it is a Bakers cyst; pt was seen earlier today. Pt request med for pain and what Dr Deborra Medina recommends.

## 2017-01-07 NOTE — Progress Notes (Signed)
Subjective:   Patient ID: Lisa Yu, female    DOB: 04/28/1951, 66 y.o.   MRN: [id number]  Lisa Yu is a pleasant 66 y.o. year old female who presents to clinic today with Knee Pain (right )  on 01/07/2017  HPI:   Recent right knee surgery- arthroscopy- Dr. Lake Bells and was scheduled for follow up on 01/12/17 with him. She cancelled that appointment.  Now her knee is bothering her so she called Dr. Clydell Hakim office yesterday to be worked in.  Appointment was made with PA but she is here today because she "wants xrays."  Two days ago, acute onset of very sharp pain behind her right knee and into her right calf, worse with weight bearing.  No known injury.   Current Outpatient Prescriptions on File Prior to Visit  Medication Sig Dispense Refill  . aspirin 81 MG tablet Take 81 mg by mouth daily.      . Calcium Carbonate-Vitamin D (TH CALCIUM CARBONATE-VITAMIN D) 600-400 MG-UNIT per tablet Take 1 tablet by mouth.     . lamoTRIgine (LAMICTAL) 200 MG tablet Take 1 tablet (200 mg total) by mouth 2 (two) times daily. 180 tablet 3  . LORazepam (ATIVAN) 0.5 MG tablet Take 1 tablet (0.5 mg total) by mouth 2 (two) times daily as needed for anxiety or sleep. 30 tablet 0  . Multiple Vitamin (MULTIVITAMIN) tablet Take 1 tablet by mouth daily.      . fluticasone (FLONASE) 50 MCG/ACT nasal spray Place 2 sprays into the nose daily as needed. Reported on 12/27/2015     No current facility-administered medications on file prior to visit.     Allergies  Allergen Reactions  . Shellfish Allergy Swelling and Rash  . Hydrocodone-Homatropine Hives and Itching  . Penicillins     REACTION: rash    Past Medical History:  Diagnosis Date  . Diverticulosis   . Osteopenia 12/21/2007  . Seizures ([place])    X 1 IN 2009    Past Surgical History:  Procedure Laterality Date  . AUGMENTATION MAMMAPLASTY Bilateral 1998   removed 2013  . BREAST SURGERY  1983   bilateral implants  . cervical  disease  1999   H/O Cryo  . CHONDROPLASTY Right 05/13/2016   Procedure: CHONDROPLASTY;  Surgeon: Dereck Leep, MD;  Location: [facility] ORS;  Service: Orthopedics;  Laterality: Right;  . COLONOSCOPY  05/01/2005  . FOOT SURGERY Bilateral   . KNEE ARTHROSCOPY Right 05/13/2016   Procedure: ARTHROSCOPY KNEE WITH MEDIAL AND LATERAL MENISECTOMY;  Surgeon: Dereck Leep, MD;  Location: [facility] ORS;  Service: Orthopedics;  Laterality: Right;  . LASIK  2007  . MOUTH SURGERY     DENTAL IMPLANTS (TOP)  . VAGINAL DELIVERY     x 1    Family History  Problem Relation Age of Onset  . Cancer Mother        skin    Social History   Social History  . Marital status: Married    Spouse name: Ernie Hew  . Number of children: 1  . Years of education: 63 th   Occupational History  . Retired from [employer] Topics  . Smoking status: Never Smoker  . Smokeless tobacco: Never Used  . Alcohol use No  . Drug use: No  . Sexual activity: Not on file   Other Topics Concern  . Not on file   Social History Narrative   Patient lives at home with her  husband Ernie Hew).   Retired and she is self employed.   Education 12 th    Right handed.   Caffeine two cups of coffee one soda daily.      Would desire CPR, no prolonged life support if futile.   The PMH, PSH, Social History, Family History, Medications, and allergies have been reviewed in [facility], and have been updated if relevant.   Review of Systems  Musculoskeletal: Positive for arthralgias.  Neurological: Negative.   All other systems reviewed and are negative.      Objective:    BP (!) 142/80   Pulse 66   Ht 5' 5.5" (1.664 m)   Wt 172 lb (78 kg)   SpO2 99%   BMI 28.19 kg/m    Physical Exam  Constitutional: She is oriented to person, place, and time. She appears well-developed and well-nourished. No distress.  HENT:  Head: Normocephalic and atraumatic.  Eyes: Conjunctivae are normal.  Cardiovascular: Normal rate.     Pulmonary/Chest: Effort normal.  Musculoskeletal:       Right knee: She exhibits decreased range of motion and swelling. She exhibits no effusion, no ecchymosis, no deformity, no laceration, no erythema, normal alignment and no LCL laxity. Tenderness found.  Neurological: She is alert and oriented to person, place, and time. No cranial nerve deficit. Coordination normal.  Skin: Skin is warm and dry. She is not diaphoretic.  Psychiatric: She has a normal mood and affect. Her behavior is normal. Thought content normal.  Nursing note and vitals reviewed.         Assessment & Plan:   Right calf pain - Plan: VAS [place] LOWER EXTREMITY VENOUS (DVT)  Acute pain of right knee - Plan: DG Knee Complete 4 Views Right No Follow-up on file.

## 2017-01-07 NOTE — Assessment & Plan Note (Signed)
New- in setting of recent knee surgery. Will get knee xray here and route to Dr. Clydell Hakim PA but I also am somewhat concerned about a DVT given acute onset of pain and location of pain. Venous doppler ordered as well. Advised to restart her 81 mg daily ASA. The patient indicates understanding of these issues and agrees with the plan.

## 2017-01-08 DIAGNOSIS — M1711 Unilateral primary osteoarthritis, right knee: Secondary | ICD-10-CM | POA: Diagnosis not present

## 2017-01-08 DIAGNOSIS — G8929 Other chronic pain: Secondary | ICD-10-CM | POA: Diagnosis not present

## 2017-01-08 DIAGNOSIS — M7121 Synovial cyst of popliteal space [Baker], right knee: Secondary | ICD-10-CM | POA: Diagnosis not present

## 2017-01-08 DIAGNOSIS — M25561 Pain in right knee: Secondary | ICD-10-CM | POA: Diagnosis not present

## 2017-01-08 NOTE — Telephone Encounter (Signed)
Pt was notified of instructions, pt verbalized understanding.   

## 2017-01-12 DIAGNOSIS — M1711 Unilateral primary osteoarthritis, right knee: Secondary | ICD-10-CM | POA: Diagnosis not present

## 2017-01-12 DIAGNOSIS — M7121 Synovial cyst of popliteal space [Baker], right knee: Secondary | ICD-10-CM | POA: Diagnosis not present

## 2017-01-12 NOTE — Telephone Encounter (Signed)
Scheduled 03/15/17

## 2017-01-15 DIAGNOSIS — M7120 Synovial cyst of popliteal space [Baker], unspecified knee: Secondary | ICD-10-CM | POA: Diagnosis not present

## 2017-01-15 DIAGNOSIS — M1711 Unilateral primary osteoarthritis, right knee: Secondary | ICD-10-CM | POA: Diagnosis not present

## 2017-02-16 DIAGNOSIS — M25561 Pain in right knee: Secondary | ICD-10-CM | POA: Diagnosis not present

## 2017-02-16 DIAGNOSIS — M5416 Radiculopathy, lumbar region: Secondary | ICD-10-CM | POA: Diagnosis not present

## 2017-02-16 DIAGNOSIS — G8929 Other chronic pain: Secondary | ICD-10-CM | POA: Diagnosis not present

## 2017-03-15 ENCOUNTER — Ambulatory Visit: Payer: Medicare HMO

## 2017-03-16 ENCOUNTER — Other Ambulatory Visit: Payer: Self-pay | Admitting: Family Medicine

## 2017-03-16 DIAGNOSIS — Z01419 Encounter for gynecological examination (general) (routine) without abnormal findings: Secondary | ICD-10-CM

## 2017-03-17 ENCOUNTER — Ambulatory Visit (INDEPENDENT_AMBULATORY_CARE_PROVIDER_SITE_OTHER): Payer: Medicare HMO

## 2017-03-17 VITALS — BP 130/76 | HR 62 | Temp 97.9°F | Ht 65.75 in | Wt 175.0 lb

## 2017-03-17 DIAGNOSIS — Z23 Encounter for immunization: Secondary | ICD-10-CM | POA: Diagnosis not present

## 2017-03-17 DIAGNOSIS — Z01419 Encounter for gynecological examination (general) (routine) without abnormal findings: Secondary | ICD-10-CM

## 2017-03-17 DIAGNOSIS — Z0000 Encounter for general adult medical examination without abnormal findings: Secondary | ICD-10-CM | POA: Diagnosis not present

## 2017-03-17 LAB — LIPID PANEL
CHOL/HDL RATIO: 3
Cholesterol: 210 mg/dL — ABNORMAL HIGH (ref 0–200)
HDL: 69 mg/dL (ref >39.00)
LDL CALC: 121 mg/dL — AB (ref 0–99)
NONHDL: 141.3
Triglycerides: 104 mg/dL (ref 0.0–149.0)
VLDL: 20.8 mg/dL (ref 0.0–40.0)

## 2017-03-17 LAB — COMPREHENSIVE METABOLIC PANEL
ALK PHOS: 55 U/L (ref 39–117)
ALT: 25 U/L (ref 0–35)
AST: 18 U/L (ref 0–37)
Albumin: 4.3 g/dL (ref 3.5–5.2)
BUN: 21 mg/dL (ref 6–23)
CHLORIDE: 106 meq/L (ref 96–112)
CO2: 30 meq/L (ref 19–32)
Calcium: 9.7 mg/dL (ref 8.4–10.5)
Creatinine, Ser: 0.92 mg/dL (ref 0.40–1.20)
GFR: 64.7 mL/min (ref >60.00)
GLUCOSE: 93 mg/dL (ref 70–99)
POTASSIUM: 4.5 meq/L (ref 3.5–5.1)
SODIUM: 141 meq/L (ref 135–145)
TOTAL PROTEIN: 7.1 g/dL (ref 6.0–8.3)
Total Bilirubin: 0.3 mg/dL (ref 0.2–1.2)

## 2017-03-17 LAB — CBC WITH DIFFERENTIAL/PLATELET
Basophils Absolute: 0 10*3/uL (ref 0.0–0.1)
Basophils Relative: 0.5 % (ref 0.0–3.0)
EOS PCT: 1.4 % (ref 0.0–5.0)
Eosinophils Absolute: 0.1 10*3/uL (ref 0.0–0.7)
HCT: 41.1 % (ref 36.0–46.0)
Hemoglobin: 13.5 g/dL (ref 12.0–15.0)
LYMPHS ABS: 1.7 10*3/uL (ref 0.7–4.0)
Lymphocytes Relative: 33.3 % (ref 12.0–46.0)
MCHC: 32.8 g/dL (ref 30.0–36.0)
MCV: 93.9 fl (ref 78.0–100.0)
MONOS PCT: 9.4 % (ref 3.0–12.0)
Monocytes Absolute: 0.5 10*3/uL (ref 0.1–1.0)
NEUTROS ABS: 2.8 10*3/uL (ref 1.4–7.7)
NEUTROS PCT: 55.4 % (ref 43.0–77.0)
PLATELETS: 329 10*3/uL (ref 150.0–400.0)
RBC: 4.37 Mil/uL (ref 3.87–5.11)
RDW: 14.8 % (ref 11.5–15.5)
WBC: 5.1 10*3/uL (ref 4.0–10.5)

## 2017-03-17 LAB — TSH: TSH: 2.47 u[IU]/mL (ref 0.35–4.50)

## 2017-03-17 NOTE — Progress Notes (Signed)
PCP notes:   Health maintenance:  Mammogram - PCP please address at next appt Bone density - PCP please address at next appt Flu vaccine - addressed Tetanus - postponed/insurance PPSV23 - administered  Abnormal screenings:   Depression score: 2  Patient concerns:   None  Nurse concerns:  None  Next PCP appt:   03/22/17 @ 0830

## 2017-03-17 NOTE — Progress Notes (Signed)
I reviewed health advisor's note, was available for consultation, and agree with documentation and plan.  

## 2017-03-17 NOTE — Patient Instructions (Signed)
Lisa Yu , Thank you for taking time to come for your Medicare Wellness Visit. I appreciate your ongoing commitment to your health goals. Please review the following plan we discussed and let me know if I can assist you in the future.   These are the goals we discussed: Goals    . Increase physical activity          Starting 03/17/2017, I will continue to use stationary bike for 30 min 2-3 days per week.        This is a list of the screening recommended for you and due dates:  Health Maintenance  Topic Date Due  . Flu Shot  10/24/2017*  . Mammogram  05/27/2018*  . DEXA scan (bone density measurement)  05/27/2018*  . Tetanus Vaccine  10/28/2022*  . Colon Cancer Screening  01/06/2021  .  Hepatitis C: One time screening is recommended by Center for Disease Control  (CDC) for  adults born from [birth year] through 1966.   Completed  . Pneumonia vaccines  Completed  *Topic was postponed. The date shown is not the original due date.   Preventive Care for Adults  A healthy lifestyle and preventive care can promote health and wellness. Preventive health guidelines for adults include the following key practices.  . A routine yearly physical is a good way to check with your health care provider about your health and preventive screening. It is a chance to share any concerns and updates on your health and to receive a thorough exam.  . Visit your dentist for a routine exam and preventive care every 6 months. Brush your teeth twice a day and floss once a day. Good oral hygiene prevents tooth decay and gum disease.  . The frequency of eye exams is based on your age, health, family medical history, use  of contact lenses, and other factors. Follow your health care provider's ecommendations for frequency of eye exams.  . Eat a healthy diet. Foods like vegetables, fruits, whole grains, low-fat dairy products, and lean protein foods contain the nutrients you need without too many calories. Decrease your  intake of foods high in solid fats, added sugars, and salt. Eat the right amount of calories for you. Get information about a proper diet from your health care provider, if necessary.  . Regular physical exercise is one of the most important things you can do for your health. Most adults should get at least 150 minutes of moderate-intensity exercise (any activity that increases your heart rate and causes you to sweat) each week. In addition, most adults need muscle-strengthening exercises on 2 or more days a week.  Silver Sneakers may be a benefit available to you. To determine eligibility, you may visit the website: www.silversneakers.com or contact program at 970-663-8747 Mon-Fri between 8AM-8PM.   . Maintain a healthy weight. The body mass index (BMI) is a screening tool to identify possible weight problems. It provides an estimate of body fat based on height and weight. Your health care provider can find your BMI and can help you achieve or maintain a healthy weight.   For adults 20 years and older: ? A BMI below 18.5 is considered underweight. ? A BMI of 18.5 to 24.9 is normal. ? A BMI of 25 to 29.9 is considered overweight. ? A BMI of 30 and above is considered obese.   . Maintain normal blood lipids and cholesterol levels by exercising and minimizing your intake of saturated fat. Eat a balanced diet with plenty of  fruit and vegetables. Blood tests for lipids and cholesterol should begin at age 2 and be repeated every 5 years. If your lipid or cholesterol levels are high, you are over 50, or you are at high risk for heart disease, you may need your cholesterol levels checked more frequently. Ongoing high lipid and cholesterol levels should be treated with medicines if diet and exercise are not working.  . If you smoke, find out from your health care provider how to quit. If you do not use tobacco, please do not start.  . If you choose to drink alcohol, please do not consume more than 2  drinks per day. One drink is considered to be 12 ounces (355 mL) of beer, 5 ounces (148 mL) of wine, or 1.5 ounces (44 mL) of liquor.  . If you are 61-66 years old, ask your health care provider if you should take aspirin to prevent strokes.  . Use sunscreen. Apply sunscreen liberally and repeatedly throughout the day. You should seek shade when your shadow is shorter than you. Protect yourself by wearing long sleeves, pants, a wide-brimmed hat, and sunglasses year round, whenever you are outdoors.  . Once a month, do a whole body skin exam, using a mirror to look at the skin on your back. Tell your health care provider of new moles, moles that have irregular borders, moles that are larger than a pencil eraser, or moles that have changed in shape or color.

## 2017-03-19 ENCOUNTER — Ambulatory Visit: Payer: Medicare HMO

## 2017-03-19 ENCOUNTER — Other Ambulatory Visit: Payer: Self-pay | Admitting: Orthopedic Surgery

## 2017-03-19 DIAGNOSIS — M5416 Radiculopathy, lumbar region: Secondary | ICD-10-CM

## 2017-03-19 NOTE — Progress Notes (Signed)
Subjective:   Lisa Yu is a 66 y.o. female who presents for Medicare Annual (Subsequent) preventive examination.  Review of Systems:  N/A Cardiac Risk Factors include: advanced age (>21men, >9 women)     Objective:     Vitals: BP 130/76 (BP Location: Right Arm, Patient Position: Sitting, Cuff Size: Normal)   Pulse 62   Temp 97.9 F (36.6 C) (Oral)   Ht 5' 5.75" (1.67 m) Comment: no shoes  Wt 175 lb (79.4 kg)   SpO2 97%   BMI 28.46 kg/m   Body mass index is 28.46 kg/m.   Tobacco History  Smoking Status  . Never Smoker  Smokeless Tobacco  . Never Used     Counseling given: No   Past Medical History:  Diagnosis Date  . Diverticulosis   . Osteopenia 12/21/2007  . Seizures ([place])    X 1 IN 2009   Past Surgical History:  Procedure Laterality Date  . AUGMENTATION MAMMAPLASTY Bilateral 1998   removed 2013  . BREAST SURGERY  1983   bilateral implants  . cervical disease  1999   H/O Cryo  . CHONDROPLASTY Right 05/13/2016   Procedure: CHONDROPLASTY;  Surgeon: Dereck Leep, MD;  Location: [facility] ORS;  Service: Orthopedics;  Laterality: Right;  . COLONOSCOPY  05/01/2005  . FOOT SURGERY Bilateral   . KNEE ARTHROSCOPY Right 05/13/2016   Procedure: ARTHROSCOPY KNEE WITH MEDIAL AND LATERAL MENISECTOMY;  Surgeon: Dereck Leep, MD;  Location: [facility] ORS;  Service: Orthopedics;  Laterality: Right;  . LASIK  2007  . MOUTH SURGERY     DENTAL IMPLANTS (TOP)  . VAGINAL DELIVERY     x 1   Family History  Problem Relation Age of Onset  . Cancer Mother        skin   History  Sexual Activity  . Sexual activity: Not on file    Outpatient Encounter Prescriptions as of 03/17/2017  Medication Sig  . aspirin 81 MG tablet Take 81 mg by mouth daily.    . Calcium Carbonate-Vitamin D (TH CALCIUM CARBONATE-VITAMIN D) 600-400 MG-UNIT per tablet Take 1 tablet by mouth.   . lamoTRIgine (LAMICTAL) 200 MG tablet Take 1 tablet (200 mg total) by mouth 2 (two) times daily.  Marland Kitchen  LORazepam (ATIVAN) 0.5 MG tablet Take 1 tablet (0.5 mg total) by mouth 2 (two) times daily as needed for anxiety or sleep.  . Multiple Vitamin (MULTIVITAMIN) tablet Take 1 tablet by mouth daily.    . fluticasone (FLONASE) 50 MCG/ACT nasal spray Place 2 sprays into the nose daily as needed. Reported on 12/27/2015   No facility-administered encounter medications on file as of 03/17/2017.     Activities of Daily Living In your present state of health, do you have any difficulty performing the following activities: 03/17/2017 04/30/2016  Hearing? N N  Vision? N N  Difficulty concentrating or making decisions? N N  Walking or climbing stairs? N Y  Dressing or bathing? N Y  Doing errands, shopping? N N  Preparing Food and eating ? N -  Using the Toilet? N -  In the past six months, have you accidently leaked urine? N -  Do you have problems with loss of bowel control? N -  Managing your Medications? N -  Managing your Finances? N -  Housekeeping or managing your Housekeeping? N -  Some recent data might be hidden    Patient Care Team: Lucille Passy, MD as PCP - Devra Dopp, MD  as Financial risk analyst Physician (Neurology)    Assessment:      Hearing Screening   125Hz  250Hz  500Hz  1000Hz  2000Hz  3000Hz  4000Hz  6000Hz  8000Hz   Right ear:   40 40 40  40    Left ear:   40 40 40  40      Visual Acuity Screening   Right eye Left eye Both eyes  Without correction: 20/40-1 20/25 20/25-1  With correction:         Exercise Activities and Dietary recommendations Current Exercise Habits: Home exercise routine, Type of exercise: Other - see comments (stationary bike), Time (Minutes): 30, Frequency (Times/Week): 3, Weekly Exercise (Minutes/Week): 90, Intensity: Moderate, Exercise limited by: None identified  Goals    . Increase physical activity          Starting 03/17/2017, I will continue to use stationary bike for 30 min 2-3 days per week.       Fall Risk Fall Risk  03/17/2017  Falls in the  past year? No   Depression Screen PHQ 2/9 Scores 03/17/2017 01/04/2017  PHQ - 2 Score 0 3  PHQ- 9 Score 2 9     Cognitive Function MMSE - Mini Mental State Exam 03/17/2017  Orientation to time 5  Orientation to Place 5  Registration 3  Attention/ Calculation 0  Recall 3  Language- name 2 objects 0  Language- repeat 1  Language- follow 3 step command 3  Language- read & follow direction 0  Write a sentence 0  Copy design 0  Total score 20     PLEASE NOTE: A Mini-Cog screen was completed. Maximum score is 20. A value of 0 denotes this part of [place] MMSE was not completed or the patient failed this part of the Mini-Cog screening.   Mini-Cog Screening Orientation to Time - Max 5 pts Orientation to Place - Max 5 pts Registration - Max 3 pts Recall - Max 3 pts Language Repeat - Max 1 pts Language Follow 3 Step Command - Max 3 pts     Immunization History  Administered Date(s) Administered  . Influenza Split 05/17/2012  . Influenza,inj,Quad PF,6+ Mos 04/05/2014, 05/14/2015  . Pneumococcal Conjugate-13 05/14/2015  . Pneumococcal Polysaccharide-23 03/17/2017  . Td 10/29/2002  . Zoster 04/07/2010   Screening Tests Health Maintenance  Topic Date Due  . INFLUENZA VACCINE  10/24/2017 (Originally 02/24/2017)  . MAMMOGRAM  05/27/2018 (Originally 05/28/2016)  . DEXA SCAN  05/27/2018 (Originally 02/10/2015)  . TETANUS/TDAP  10/28/2022 (Originally 10/28/2012)  . COLONOSCOPY  01/06/2021  . Hepatitis C Screening  Completed  . PNA vac Low Risk Adult  Completed      Plan:     I have personally reviewed and addressed the Medicare Annual Wellness questionnaire and have noted the following in the patient's chart:  A. Medical and social history B. Use of alcohol, tobacco or illicit drugs  C. Current medications and supplements D. Functional ability and status E.  Nutritional status F.  Physical activity G. Advance directives H. List of other physicians I.  Hospitalizations,  surgeries, and ER visits in previous 12 months J.  [place] to include hearing, vision, cognitive, depression L. Referrals and appointments - none  In addition, I have reviewed and discussed with patient certain preventive protocols, quality metrics, and best practice recommendations. A written personalized care plan for preventive services as well as general preventive health recommendations were provided to patient.  See attached scanned questionnaire for additional information.   Signed,   Lindell Noe, MHA,  BS, LPN Health Coach

## 2017-03-22 ENCOUNTER — Ambulatory Visit (INDEPENDENT_AMBULATORY_CARE_PROVIDER_SITE_OTHER): Payer: Medicare HMO | Admitting: Family Medicine

## 2017-03-22 ENCOUNTER — Encounter: Payer: Self-pay | Admitting: Family Medicine

## 2017-03-22 ENCOUNTER — Encounter: Payer: Medicare HMO | Admitting: Family Medicine

## 2017-03-22 VITALS — BP 150/90 | HR 65 | Temp 97.9°F | Resp 18 | Ht 65.5 in | Wt 175.0 lb

## 2017-03-22 DIAGNOSIS — M179 Osteoarthritis of knee, unspecified: Secondary | ICD-10-CM | POA: Insufficient documentation

## 2017-03-22 DIAGNOSIS — Z0000 Encounter for general adult medical examination without abnormal findings: Secondary | ICD-10-CM

## 2017-03-22 DIAGNOSIS — G40209 Localization-related (focal) (partial) symptomatic epilepsy and epileptic syndromes with complex partial seizures, not intractable, without status epilepticus: Secondary | ICD-10-CM | POA: Diagnosis not present

## 2017-03-22 DIAGNOSIS — M174 Other bilateral secondary osteoarthritis of knee: Secondary | ICD-10-CM

## 2017-03-22 DIAGNOSIS — Z01419 Encounter for gynecological examination (general) (routine) without abnormal findings: Secondary | ICD-10-CM

## 2017-03-22 DIAGNOSIS — M1710 Unilateral primary osteoarthritis, unspecified knee: Secondary | ICD-10-CM | POA: Insufficient documentation

## 2017-03-22 NOTE — Patient Instructions (Signed)
Great to see you! Good luck with your MRI tomorrow.

## 2017-03-22 NOTE — Assessment & Plan Note (Signed)
Followed by Dr. Marry Guan. No changes made today.

## 2017-03-22 NOTE — Progress Notes (Signed)
66 yo pleasant female here for CPXand follow up of chronic medical conditions.  Medicare wellness visit with Candis Musa, RN on 03/17/17. Note reviewed.  Health Maintenance  Topic Date Due  . Flu Shot  10/24/2017*  . Mammogram  05/27/2018*  . DEXA scan (bone density measurement)  05/27/2018*  . Tetanus Vaccine  10/28/2022*  . Colon Cancer Screening  01/06/2021  .  Hepatitis C: One time screening is recommended by Center for Disease Control  (CDC) for  adults born from [birth year] through 1966.   Completed  . Pneumonia vaccines  Completed  *Topic was postponed. The date shown is not the original due date.   Right knee pain- last saw Dr. Marry Guan on 02/16/17. Note reviewed. 9 months s/p right knee arthroscopy. Xray done.  Steroid burst called in.  Prednisone help although it is "starting to give him trouble again." Has an MRI scheduled for tomorrow.  Seizure disorder- On lamictal. Last aw Evlyn Courier, neuro NP, on 08/11/16. Note reviewed. Last seizure activity in 2010.  No changes made.  Advised continued yearly follow up.  Lab Results  Component Value Date   CHOL 210 (H) 03/17/2017   HDL 69.00 03/17/2017   LDLCALC 121 (H) 03/17/2017   LDLDIRECT 139.7 01/29/2011   TRIG 104.0 03/17/2017   CHOLHDL 3 03/17/2017   Lab Results  Component Value Date   WBC 5.1 03/17/2017   HGB 13.5 03/17/2017   HCT 41.1 03/17/2017   MCV 93.9 03/17/2017   PLT 329.0 03/17/2017   Lab Results  Component Value Date   NA 141 03/17/2017   K 4.5 03/17/2017   CL 106 03/17/2017   CO2 30 03/17/2017   Lab Results  Component Value Date   CREATININE 0.92 03/17/2017     Patient Active Problem List   Diagnosis Date Noted  . Well woman exam 03/16/2017  . Grief 01/04/2017  . Insomnia 01/16/2016  . Complex partial seizure ([place]) 05/10/2014  . IBS (irritable bowel syndrome) 11/30/2013  . Postmenopausal HRT (hormone replacement therapy) 11/30/2013  . Elevated blood pressure reading without diagnosis of  hypertension 06/20/2013  . Partial epilepsy with impairment of consciousness ([place]) 05/09/2013  . Iron deficiency anemia, unspecified 01/23/2011  . HEADACHE 05/16/2010  . MAMMOGRAM, ABNORMAL 02/06/2010  . Convulsions ([place]) 02/25/2008  . DIVERTICULOSIS, COLON 12/27/2007  . STATE, SYMPTOMATIC MENOPAUSE/FEM CLIMACTERIC 12/27/2007  . SPONDYLOSIS, LUMBAR 12/27/2007  . OSTEOPENIA 12/27/2007  . BREAST IMPLANTS, BILATERAL, HX OF 12/27/2007   Past Medical History:  Diagnosis Date  . Diverticulosis   . Osteopenia 12/21/2007  . Seizures ([place])    X 1 IN 2009   Past Surgical History:  Procedure Laterality Date  . AUGMENTATION MAMMAPLASTY Bilateral 1998   removed 2013  . BREAST SURGERY  1983   bilateral implants  . cervical disease  1999   H/O Cryo  . CHONDROPLASTY Right 05/13/2016   Procedure: CHONDROPLASTY;  Surgeon: Dereck Leep, MD;  Location: [facility] ORS;  Service: Orthopedics;  Laterality: Right;  . COLONOSCOPY  05/01/2005  . FOOT SURGERY Bilateral   . KNEE ARTHROSCOPY Right 05/13/2016   Procedure: ARTHROSCOPY KNEE WITH MEDIAL AND LATERAL MENISECTOMY;  Surgeon: Dereck Leep, MD;  Location: [facility] ORS;  Service: Orthopedics;  Laterality: Right;  . LASIK  2007  . MOUTH SURGERY     DENTAL IMPLANTS (TOP)  . VAGINAL DELIVERY     x 1   Social History  Substance Use Topics  . Smoking status: Never Smoker  . Smokeless tobacco: Never Used  .  Alcohol use No   Family History  Problem Relation Age of Onset  . Cancer Mother        skin   Allergies  Allergen Reactions  . Shellfish Allergy Swelling and Rash  . Hydrocodone-Homatropine Hives and Itching  . Penicillins     REACTION: rash   Current Outpatient Prescriptions on File Prior to Visit  Medication Sig Dispense Refill  . aspirin 81 MG tablet Take 81 mg by mouth daily.      . Calcium Carbonate-Vitamin D (TH CALCIUM CARBONATE-VITAMIN D) 600-400 MG-UNIT per tablet Take 1 tablet by mouth.     . lamoTRIgine (LAMICTAL) 200 MG  tablet Take 1 tablet (200 mg total) by mouth 2 (two) times daily. 180 tablet 3  . LORazepam (ATIVAN) 0.5 MG tablet Take 1 tablet (0.5 mg total) by mouth 2 (two) times daily as needed for anxiety or sleep. 30 tablet 0  . Multiple Vitamin (MULTIVITAMIN) tablet Take 1 tablet by mouth daily.      . fluticasone (FLONASE) 50 MCG/ACT nasal spray Place 2 sprays into the nose daily as needed. Reported on 12/27/2015     No current facility-administered medications on file prior to visit.     The PMH, PSH, Social History, Family History, Medications, and allergies have been reviewed in [facility], and have been updated if relevant.   Review of Systems  Constitutional: Negative.   HENT: Negative.   Eyes: Negative.   Respiratory: Negative.   Cardiovascular: Negative.   Gastrointestinal: Negative.   Endocrine: Negative.   Genitourinary: Negative.   Musculoskeletal: Positive for arthralgias. Negative for back pain, gait problem and joint swelling.  Skin: Negative.   Neurological: Negative.   Hematological: Negative.   Psychiatric/Behavioral: Negative.   All other systems reviewed and are negative.    Physical Exam BP (!) 150/90   Pulse 65   Temp 97.9 F (36.6 C)   Resp 18   Ht 5' 5.5" (1.664 m)   Wt 175 lb (79.4 kg)   SpO2 98%   BMI 28.68 kg/m   BP Readings from Last 3 Encounters:  03/22/17 (!) 150/90  03/17/17 130/76  01/07/17 (!) 142/80    General:  Well-developed,well-nourished,in no acute distress; alert,appropriate and cooperative throughout examination Head:  normocephalic and atraumatic.   Eyes:  vision grossly intact, PERRL Ears:  R ear normal and L ear normal externally, TMs clear bilaterally Nose:  no external deformity.   Mouth:  good dentition.   Neck:  No deformities, masses, or tenderness noted. Lungs:  Normal respiratory effort, chest expands symmetrically. Lungs are clear to auscultation, no crackles or wheezes. Heart:  Normal rate and regular rhythm. S1 and S2 normal  without gallop, murmur, click, rub or other extra sounds. Abdomen:  Bowel sounds positive,abdomen soft and non-tender without masses, organomegaly or hernias noted. Msk:  No deformity or scoliosis noted of thoracic or lumbar spine.   Extremities:  No clubbing, cyanosis, edema, or deformity noted with normal full range of motion of all joints.   Neurologic:  alert & oriented X3 and gait normal.   Skin:  Intact without suspicious lesions or rashes Cervical Nodes:  No lymphadenopathy noted Axillary Nodes:  No palpable lymphadenopathy Psych:  Cognition and judgment appear intact. Alert and cooperative with normal attention span and concentration. No apparent delusions, illusions, hallucinations

## 2017-03-22 NOTE — Assessment & Plan Note (Signed)
Reviewed preventive care protocols, scheduled due services, and updated immunizations Discussed nutrition, exercise, diet, and healthy lifestyle.  

## 2017-03-22 NOTE — Assessment & Plan Note (Signed)
Last seizure in 2010. On rx and followed yearly by neuro. No changes made today.

## 2017-03-23 ENCOUNTER — Ambulatory Visit
Admission: RE | Admit: 2017-03-23 | Discharge: 2017-03-23 | Disposition: A | Discharge status: Home / Self Care | Payer: Medicare HMO | Source: Ambulatory Visit | Attending: Orthopedic Surgery | Admitting: Orthopedic Surgery

## 2017-03-23 DIAGNOSIS — M4726 Other spondylosis with radiculopathy, lumbar region: Secondary | ICD-10-CM | POA: Diagnosis not present

## 2017-03-23 DIAGNOSIS — M48061 Spinal stenosis, lumbar region without neurogenic claudication: Secondary | ICD-10-CM | POA: Insufficient documentation

## 2017-03-23 DIAGNOSIS — M5416 Radiculopathy, lumbar region: Secondary | ICD-10-CM | POA: Diagnosis present

## 2017-03-23 DIAGNOSIS — M5126 Other intervertebral disc displacement, lumbar region: Secondary | ICD-10-CM | POA: Diagnosis not present

## 2017-03-25 DIAGNOSIS — M5416 Radiculopathy, lumbar region: Secondary | ICD-10-CM | POA: Diagnosis not present

## 2017-03-25 DIAGNOSIS — M7051 Other bursitis of knee, right knee: Secondary | ICD-10-CM | POA: Diagnosis not present

## 2017-03-26 ENCOUNTER — Ambulatory Visit: Payer: Medicare HMO

## 2017-04-01 ENCOUNTER — Encounter: Payer: Medicare HMO | Admitting: Family Medicine

## 2017-04-08 ENCOUNTER — Ambulatory Visit
Admission: RE | Admit: 2017-04-08 | Discharge: 2017-04-08 | Disposition: A | Discharge status: Home / Self Care | Payer: Medicare HMO | Source: Ambulatory Visit | Attending: Family Medicine | Admitting: Family Medicine

## 2017-04-08 ENCOUNTER — Ambulatory Visit (INDEPENDENT_AMBULATORY_CARE_PROVIDER_SITE_OTHER): Payer: Medicare HMO | Admitting: Family Medicine

## 2017-04-08 ENCOUNTER — Encounter: Payer: Self-pay | Admitting: Family Medicine

## 2017-04-08 VITALS — BP 120/78 | HR 68 | Temp 98.4°F | Ht 65.5 in | Wt 175.2 lb

## 2017-04-08 DIAGNOSIS — R1030 Lower abdominal pain, unspecified: Secondary | ICD-10-CM

## 2017-04-08 DIAGNOSIS — R102 Pelvic and perineal pain: Secondary | ICD-10-CM | POA: Insufficient documentation

## 2017-04-08 DIAGNOSIS — K5730 Diverticulosis of large intestine without perforation or abscess without bleeding: Secondary | ICD-10-CM | POA: Diagnosis not present

## 2017-04-08 LAB — POC URINALSYSI DIPSTICK (AUTOMATED)
Bilirubin, UA: NEGATIVE
Glucose, UA: NEGATIVE
Ketones, UA: NEGATIVE
LEUKOCYTES UA: NEGATIVE
NITRITE UA: NEGATIVE
PH UA: 7 (ref 5.0–8.0)
PROTEIN UA: NEGATIVE
RBC UA: NEGATIVE
Spec Grav, UA: 1.02 (ref 1.010–1.025)
UROBILINOGEN UA: 0.2 U/dL

## 2017-04-08 MED ORDER — IOPAMIDOL (ISOVUE-300) INJECTION 61%
100.0000 mL | Freq: Once | INTRAVENOUS | Status: AC | PRN
  Administered 2017-04-08: 100 mL via INTRAVENOUS

## 2017-04-08 NOTE — Progress Notes (Signed)
Subjective:   Patient ID: Lisa Yu, female    DOB: 1950-10-31, 66 y.o.   MRN: [id number]  Lisa Yu is a pleasant 66 y.o. year old female who presents to clinic today with Abdominal Pain ("stomach ache x 3 days")  on 04/08/2017  HPI:  Lower abdominal pain - bilateral, constant for 3 days  7-8/10.  Nothing makes it better or worse.  Has tried taking tylenol for it.  No dysuria, increased urinary frequency or vaginal discharge.  NO changes in her bowel habits.  NO blood in her stool.  No n/v. No fever.  Colonoscopy overdue but last one showed diverticulosis.  Current Outpatient Prescriptions on File Prior to Visit  Medication Sig Dispense Refill  . aspirin 81 MG tablet Take 81 mg by mouth daily.      . Calcium Carbonate-Vitamin D (TH CALCIUM CARBONATE-VITAMIN D) 600-400 MG-UNIT per tablet Take 1 tablet by mouth.     . fluticasone (FLONASE) 50 MCG/ACT nasal spray Place 2 sprays into the nose daily as needed. Reported on 12/27/2015    . lamoTRIgine (LAMICTAL) 200 MG tablet Take 1 tablet (200 mg total) by mouth 2 (two) times daily. 180 tablet 3  . LORazepam (ATIVAN) 0.5 MG tablet Take 1 tablet (0.5 mg total) by mouth 2 (two) times daily as needed for anxiety or sleep. 30 tablet 0  . Multiple Vitamin (MULTIVITAMIN) tablet Take 1 tablet by mouth daily.       No current facility-administered medications on file prior to visit.     Allergies  Allergen Reactions  . Shellfish Allergy Swelling and Rash  . Hydrocodone-Homatropine Hives and Itching  . Penicillins     REACTION: rash    Past Medical History:  Diagnosis Date  . Diverticulosis   . Osteopenia 12/21/2007  . Seizures ([place])    X 1 IN 2009    Past Surgical History:  Procedure Laterality Date  . AUGMENTATION MAMMAPLASTY Bilateral 1998   removed 2013  . BREAST SURGERY  1983   bilateral implants  . cervical disease  1999   H/O Cryo  . CHONDROPLASTY Right 05/13/2016   Procedure: CHONDROPLASTY;  Surgeon:  Dereck Leep, MD;  Location: [facility] OR[facility];  Service: Orthopedics;  Laterality: Right;  . COLONOSCOPY  05/01/2005  . FOOT SURGERY Bilateral   . KNEE ARTHROSCOPY Right 05/13/2016   Procedure: ARTHROSCOPY KNEE WITH MEDIAL AND LATERAL MENISECTOMY;  Surgeon: Dereck Leep, MD;  Location: [facility] OR[facility];  Service: Orthopedics;  Laterality: Right;  . LASIK  2007  . MOUTH SURGERY     DENTAL IMPLANTS (TOP)  . VAGINAL DELIVERY     x 1    Family History  Problem Relation Age of Onset  . Cancer Mother        skin    Social History   Social History  . Marital status: Married    Spouse name: Ernie Hew  . Number of children: 1  . Years of education: 72 th   Occupational History  . Retired from [employer] Topics  . Smoking status: Never Smoker  . Smokeless tobacco: Never Used  . Alcohol use No  . Drug use: No  . Sexual activity: Not on file   Other Topics Concern  . Not on file   Social History Narrative   Patient lives at home with her husband Ernie Hew).   Retired and she is self employed.   Education 12 th    Right handed.  Caffeine two cups of coffee one soda daily.      Would desire CPR, no prolonged life support if futile.   The PMH, PSH, Social History, Family History, Medications, and allergies have been reviewed in [facility], and have been updated if relevant.   Review of Systems  Constitutional: Negative.   HENT: Negative.   Respiratory: Negative.   Cardiovascular: Negative.   Gastrointestinal: Positive for abdominal pain. Negative for abdominal distention, anal bleeding, blood in stool, constipation, diarrhea, nausea, rectal pain and vomiting.  Genitourinary: Negative.   All other systems reviewed and are negative.      Objective:    BP 120/78   Pulse 68   Temp 98.4 F (36.9 C) (Oral)   Ht 5' 5.5" (1.664 m)   Wt 175 lb 4 oz (79.5 kg)   BMI 28.72 kg/m    Physical Exam  Constitutional: She is oriented to person, place, and time. She appears  well-developed and well-nourished. No distress.  HENT:  Head: Normocephalic and atraumatic.  Eyes: Conjunctivae are normal.  Cardiovascular: Normal rate.   Pulmonary/Chest: Effort normal.  Abdominal: Normal appearance and bowel sounds are normal. There is no hepatosplenomegaly, splenomegaly or hepatomegaly. There is tenderness in the suprapubic area. There is no rigidity, no rebound, no guarding, no CVA tenderness, no tenderness at McBurney's point and negative Murphy's sign.  Musculoskeletal: Normal range of motion.  Neurological: She is alert and oriented to person, place, and time. No cranial nerve deficit.  Skin: Skin is warm and dry. She is not diaphoretic.  Psychiatric: She has a normal mood and affect. Her behavior is normal. Judgment and thought content normal.  Nursing note and vitals reviewed.         Assessment & Plan:   Lower abdominal pain - Plan: POCT Urinalysis Dipstick (Automated), CT Abdomen Pelvis W Contrast, CANCELED: CT Abdomen Pelvis Wo Contrast  Suprapubic pain, acute No Follow-up on file.

## 2017-04-08 NOTE — Patient Instructions (Signed)
Great to see you.  Please stop by to [facility] on your way out.

## 2017-04-08 NOTE — Assessment & Plan Note (Signed)
New- unclear etiology at this point. UA neg. ? Diverticulitis although symptoms not classic for this. Given severity of pain, will order CT of abd/pelvis for further evaluation. The patient indicates understanding of these issues and agrees with the plan.

## 2017-05-04 DIAGNOSIS — R69 Illness, unspecified: Secondary | ICD-10-CM | POA: Diagnosis not present

## 2017-05-20 ENCOUNTER — Telehealth: Payer: Self-pay | Admitting: Neurology

## 2017-05-20 NOTE — Telephone Encounter (Signed)
Spoke to patient - she is requesting an appt before the end to the year.  She is established with Hoyle Sauer and she had an available office visit on 05/24/17.  I called to offer the appt to the patient and she accepted.

## 2017-05-20 NOTE — Telephone Encounter (Signed)
Pt called her insurance will changing in January and from her understanding the clinic is not in the next work, it will still be [employer].  lamoTRIgine (LAMICTAL) 200 MG tablet will be a tier 4, and will cost more. She is wanting to know if there will be another medication she could try that won't be so expensive. Pt has an appt 08/12/17. She would like to be seen before the end of the year if possible. She is aware Dr Krista Blue is booked.

## 2017-05-21 NOTE — Progress Notes (Signed)
[name] DOB: 01-10-1951   REASON FOR VISIT: Complex partial seizures HISTORY FROM: Patient    HISTORY OF PRESENT ILLNESS:UPDATE 10/29/2018CM [name], [place] 66-year-old female returns for follow-up with history of complex partial seizure disorder with last seizure occurring in July 2010. She is currently on Lamictal 200 twice a day. She denies missing any doses of medication. Her insurance is changing in January and our practice will be out of network. She was made aware she needs to talk to her insurance agent about this. She is retired continues to keep her grandchildren. Yearly labs are followed through her primary care. She continues to have some right knee pain after her surgery last year in October. She returns for reevaluation  08/01/16 CM [name], is a 66 year-old Caucasian female with history of complex partial seizures with secondary generalization. She is here for yearly follow-up  She is taking and tolerating Generic  Lamictal  200mg  BID. Her last seizure occurred in July, 2010, on a day of traveling. She says that she was tired and experienced a funny feeling like when she had seizures in the past, but no overt seizure activity. She has had no further auras or seizures. She is retired and keeps her grandchildren. Yearly labs are followed by her primary care. She had right knee surgery in October and continues to have some right knee pain. No new neurological complaints.   UPDATE Oct 15th 2015:YY She has has no recurrent seizure, is taking brand name Lamictal xr 100mg  4 tabs qhs. She is worried about the medication cost, open to option of generic, She still drives.  UPDATE Jan 19th 2016:YY Left parietal area intermittent transient sharp pain, lasting for few second, mild headache afterwards, she tolerating generic lamotrigine xr 100mg  4 tabs well. Level was 11.8.    REVIEW OF SYSTEMS: Full 14 system review of systems performed and  notable only for those listed, all others are neg:  Constitutional: neg  Cardiovascular: neg Ear/Nose/Throat: neg  Skin: neg Eyes: neg Respiratory: neg Gastroitestinal: neg  Hematology/Lymphatic: neg  Endocrine: neg Musculoskeletal:neg Allergy/Immunology: neg Neurological: neg Psychiatric: neg Sleep : neg   ALLERGIES: Allergies  Allergen Reactions  . Other Itching and Rash    Shellfish containing products   . Hydrocodone-Homatropine Hives and Itching  . Penicillins     REACTION: rash    HOME MEDICATIONS: Outpatient Medications Prior to Visit  Medication Sig Dispense Refill  . aspirin 81 MG tablet Take 81 mg by mouth daily.      . Calcium Carbonate-Vitamin D (TH CALCIUM CARBONATE-VITAMIN D) 600-400 MG-UNIT per tablet Take 1 tablet by mouth.     . fluticasone (FLONASE) 50 MCG/ACT nasal spray Place 2 sprays into the nose daily as needed. Reported on 12/27/2015    . lamoTRIgine (LAMICTAL) 200 MG tablet Take 1 tablet (200 mg total) by mouth 2 (two) times daily. 180 tablet 3  . LORazepam (ATIVAN) 0.5 MG tablet Take 1 tablet (0.5 mg total) by mouth 2 (two) times daily as needed for anxiety or sleep. 30 tablet 0  . Multiple Vitamin (MULTIVITAMIN) tablet Take 1 tablet by mouth daily.       No facility-administered medications prior to visit.     PAST MEDICAL HISTORY: Past Medical History:  Diagnosis Date  . Diverticulosis   . Osteopenia 12/21/2007  . Seizures ([place])    X 1 IN 2009    PAST SURGICAL HISTORY: Past Surgical History:  Procedure Laterality Date  .  AUGMENTATION MAMMAPLASTY Bilateral 1998   removed 2013  . BREAST SURGERY  1983   bilateral implants  . cervical disease  1999   H/O Cryo  . CHONDROPLASTY Right 05/13/2016   Procedure: CHONDROPLASTY;  Surgeon: Dereck Leep, MD;  Location: [facility] ORS;  Service: Orthopedics;  Laterality: Right;  . COLONOSCOPY  05/01/2005  . FOOT SURGERY Bilateral   . KNEE ARTHROSCOPY Right 05/13/2016   Procedure: ARTHROSCOPY KNEE  WITH MEDIAL AND LATERAL MENISECTOMY;  Surgeon: Dereck Leep, MD;  Location: [facility] ORS;  Service: Orthopedics;  Laterality: Right;  . LASIK  2007  . MOUTH SURGERY     DENTAL IMPLANTS (TOP)  . VAGINAL DELIVERY     x 1    FAMILY HISTORY: Family History  Problem Relation Age of Onset  . Cancer Mother        skin    SOCIAL HISTORY: Social History   Social History  . Marital status: Married    Spouse name: Ernie Hew  . Number of children: 1  . Years of education: 90 th   Occupational History  . Retired from [employer] Topics  . Smoking status: Never Smoker  . Smokeless tobacco: Never Used  . Alcohol use No  . Drug use: No  . Sexual activity: Not on file   Other Topics Concern  . Not on file   Social History Narrative   Patient lives at home with her husband Ernie Hew).   Retired and she is self employed.   Education 12 th    Right handed.   Caffeine two cups of coffee one soda daily.      Would desire CPR, no prolonged life support if futile.  Marland Kitchen   PHYSICAL EXAM  Vitals:   05/24/17 0921  BP: (!) 149/74  Pulse: 70  Weight: 181 lb 9.6 oz (82.4 kg)  Height: 5' 5.5" (1.664 m)   Body mass index is 29.76 kg/m. Generalized: Well developed, in no acute distress  Head: normocephalic and atraumatic,. Oropharynx benign  Musculoskeletal: No deformity   Neurological examination   Mentation: Alert oriented to time, place, history taking. Follows all commands speech and language fluent  Cranial nerve II-XII: .Pupils were equal round reactive to light extraocular movements were full, visual field were full on confrontational test. Facial sensation and strength were normal. hearing was intact to finger rubbing bilaterally. Uvula tongue midline. head turning and shoulder shrug and were normal and symmetric.Tongue protrusion into cheek strength was normal. Motor: normal bulk and tone, full strength in the BUE, BLE, fine finger movements normal, no  pronator drift. No focal weakness Sensory: normal and symmetric to light touch, pinprick, and vibration In the upper and lower extremities Coordination: finger-nose-finger, heel-to-shin bilaterally, no dysmetria, no tremor Reflexes: 1+ upper, lower and symmetric Gait and Station: Rising up from seated position without assistance, normal stance, moderate stride, good arm swing, smooth turning, able to perform tiptoe, and heel walking without difficulty. Tandem steady. No assistive device   DIAGNOSTIC DATA (LABS, IMAGING, TESTING) - I reviewed patient records, labs, notes, testing and imaging myself where available.  Lab Results  Component Value Date   WBC 5.1 03/17/2017   HGB 13.5 03/17/2017   HCT 41.1 03/17/2017   MCV 93.9 03/17/2017   PLT 329.0 03/17/2017      Component Value Date/Time   NA 141 03/17/2017 0956   K 4.5 03/17/2017 0956   CL 106 03/17/2017 0956   CO2 30 03/17/2017 0956  GLUCOSE 93 03/17/2017 0956   BUN 21 03/17/2017 0956   CREATININE 0.92 03/17/2017 0956   CALCIUM 9.7 03/17/2017 0956   PROT 7.1 03/17/2017 0956   ALBUMIN 4.3 03/17/2017 0956   AST 18 03/17/2017 0956   ALT 25 03/17/2017 0956   ALKPHOS 55 03/17/2017 0956   BILITOT 0.3 03/17/2017 0956   GFRNONAA 87.85 12/31/2009 0953   GFRAA 111 12/21/2007 0816   Lab Results  Component Value Date   CHOL 210 (H) 03/17/2017   HDL 69.00 03/17/2017   LDLCALC 121 (H) 03/17/2017   LDLDIRECT 139.7 01/29/2011   TRIG 104.0 03/17/2017   CHOLHDL 3 03/17/2017    Lab Results  Component Value Date   TSH 2.47 03/17/2017      ASSESSMENT AND PLAN  66 y.o. year old female  has a past medical history of Seizures ([place]). here to follow-up. Last seizure activity 2010.Reviewed CBC and CMP from 03/17/2017  Continue Lamictal at current dose will refill Call for any seizure activity Follow-up yearly and when necessary She needs to discuss with her insurance agent about physicians in her network, we do not make those  decisions I spent 15 min in total face to face time with the patient more than 50% of which was spent counseling and coordination of care, reviewing test results reviewing medications and discussing and reviewing the diagnosis of seizure disorder and importance of being compliant with medication. Reviewed common seizure triggers, such as sleep deprivation, low blood sugar, etc. Dennie Bible, [facility], [facility], APRN  [facility] [address] [place], [place] [address] [phone]

## 2017-05-24 ENCOUNTER — Encounter: Payer: Self-pay | Admitting: Nurse Practitioner

## 2017-05-24 ENCOUNTER — Ambulatory Visit (INDEPENDENT_AMBULATORY_CARE_PROVIDER_SITE_OTHER): Payer: Medicare HMO | Admitting: Nurse Practitioner

## 2017-05-24 ENCOUNTER — Encounter (INDEPENDENT_AMBULATORY_CARE_PROVIDER_SITE_OTHER): Payer: Self-pay

## 2017-05-24 VITALS — BP 149/74 | HR 70 | Ht 65.5 in | Wt 181.6 lb

## 2017-05-24 DIAGNOSIS — G40209 Localization-related (focal) (partial) symptomatic epilepsy and epileptic syndromes with complex partial seizures, not intractable, without status epilepticus: Secondary | ICD-10-CM | POA: Diagnosis not present

## 2017-05-24 MED ORDER — LAMOTRIGINE 200 MG PO TABS: 200.0000 mg | ORAL_TABLET | Freq: Two times a day (BID) | ORAL | 3 refills | Status: AC

## 2017-05-24 NOTE — Patient Instructions (Signed)
Continue Lamictal at current dose will refill Call for any seizure activity Follow-up yearly and when necessary

## 2017-05-25 NOTE — Progress Notes (Signed)
I have reviewed and agreed above plan. 

## 2017-07-08 ENCOUNTER — Ambulatory Visit: Payer: Medicare HMO | Admitting: Family Medicine

## 2017-07-08 DIAGNOSIS — Z79899 Other long term (current) drug therapy: Secondary | ICD-10-CM | POA: Diagnosis not present

## 2017-07-08 DIAGNOSIS — R569 Unspecified convulsions: Secondary | ICD-10-CM | POA: Diagnosis not present

## 2017-08-12 ENCOUNTER — Ambulatory Visit: Payer: Medicare HMO | Admitting: Neurology

## 2017-09-08 DIAGNOSIS — R69 Illness, unspecified: Secondary | ICD-10-CM | POA: Diagnosis not present

## 2017-10-22 DIAGNOSIS — H1045 Other chronic allergic conjunctivitis: Secondary | ICD-10-CM | POA: Diagnosis not present

## 2017-10-25 ENCOUNTER — Encounter: Payer: Self-pay | Admitting: Podiatry

## 2017-10-25 ENCOUNTER — Ambulatory Visit (INDEPENDENT_AMBULATORY_CARE_PROVIDER_SITE_OTHER): Payer: Medicare HMO

## 2017-10-25 ENCOUNTER — Ambulatory Visit: Payer: Medicare HMO | Admitting: Podiatry

## 2017-10-25 VITALS — BP 168/89 | HR 69

## 2017-10-25 DIAGNOSIS — M779 Enthesopathy, unspecified: Secondary | ICD-10-CM

## 2017-10-25 DIAGNOSIS — M7672 Peroneal tendinitis, left leg: Secondary | ICD-10-CM | POA: Diagnosis not present

## 2017-10-25 NOTE — Progress Notes (Signed)
She presents today with lateral pain to the left foot times 3-4 weeks she states that it started after walking on a treadmill for long periods of time.  She describes the pain as a constant burning sensation.  She has taken ibuprofen to no avail.  Objective: Vital signs are stable alert and oriented x3.  Pulses are palpable.  She has no tenderness on palpation medial calcaneal tubercle though she does have tenderness on direct palpation of the fifth metatarsal base with an underlying boggy fluctuance.  Radiographs do not demonstrate any type of osseous abnormalities in this area.  Soft tissue increase in density is prominent beneath the lateral styloid fifth met.  Assessment: Bursitis fifth metatarsal base.  Plan: I injected the fifth metatarsal base with 20 mg of Kenalog 5 mg Marcaine to the point of maximal tenderness after sterile Betadine skin prep.  She tolerated the procedure well without complications.  Follow-up with her as needed.  We discussed appropriate shoe gear stretching exercises ice therapy as your modifications.

## 2017-10-26 ENCOUNTER — Telehealth: Payer: Self-pay | Admitting: Family Medicine

## 2017-10-26 NOTE — Telephone Encounter (Addendum)
*  Update, patient came back into office. Patient scheduled for tomorrow 10/27/17 @ 9:15am with Dr. Deborra Medina. Patient would still like for you to call her.

## 2017-10-26 NOTE — Telephone Encounter (Signed)
Patient came into the office today. Patient said she had a 12:45pm appointment with Dr. Deborra Medina. Patient did not have an appointment in the system. Patient said she called last week and was given appointment for 10/26/17 @ 12:45pm. Per Dr. Deborra Medina she is not able to see her today and ask that we scheduled for another. I offered to schedule patient for tomorrow, patient declined due to living in [place]. Patient is requesting you to write her a prescription for Hycomine - for her stomach. Please contact patient at [phone].

## 2017-10-26 NOTE — Telephone Encounter (Signed)
Talked to pt/she is coming in the morning/thx dmf

## 2017-10-27 ENCOUNTER — Telehealth: Payer: Self-pay | Admitting: Family Medicine

## 2017-10-27 ENCOUNTER — Ambulatory Visit (INDEPENDENT_AMBULATORY_CARE_PROVIDER_SITE_OTHER): Payer: Medicare HMO | Admitting: Family Medicine

## 2017-10-27 ENCOUNTER — Encounter: Payer: Self-pay | Admitting: Family Medicine

## 2017-10-27 ENCOUNTER — Other Ambulatory Visit: Payer: Self-pay

## 2017-10-27 VITALS — BP 122/78 | HR 81 | Temp 98.5°F | Ht 66.0 in | Wt 187.0 lb

## 2017-10-27 DIAGNOSIS — F419 Anxiety disorder, unspecified: Secondary | ICD-10-CM

## 2017-10-27 DIAGNOSIS — R69 Illness, unspecified: Secondary | ICD-10-CM | POA: Diagnosis not present

## 2017-10-27 DIAGNOSIS — E2839 Other primary ovarian failure: Secondary | ICD-10-CM | POA: Diagnosis not present

## 2017-10-27 DIAGNOSIS — Z1239 Encounter for other screening for malignant neoplasm of breast: Secondary | ICD-10-CM

## 2017-10-27 DIAGNOSIS — Z1231 Encounter for screening mammogram for malignant neoplasm of breast: Secondary | ICD-10-CM

## 2017-10-27 DIAGNOSIS — K588 Other irritable bowel syndrome: Secondary | ICD-10-CM | POA: Diagnosis not present

## 2017-10-27 MED ORDER — LORAZEPAM 0.5 MG PO TABS: 0.5000 mg | ORAL_TABLET | Freq: Two times a day (BID) | ORAL | 2 refills | Status: DC | PRN

## 2017-10-27 MED ORDER — FLUTICASONE PROPIONATE 50 MCG/ACT NA SUSP: 2.0000 | Freq: Every day | NASAL | 99 refills | Status: DC | PRN

## 2017-10-27 MED ORDER — HYOSCYAMINE SULFATE 0.125 MG PO TABS: 0.1250 mg | ORAL_TABLET | ORAL | 0 refills | Status: DC | PRN

## 2017-10-27 NOTE — Assessment & Plan Note (Signed)
Uses benzo sparingly. Rx refilled today.

## 2017-10-27 NOTE — Assessment & Plan Note (Signed)
eRx sent for Levsin. Call or return to clinic prn if these symptoms worsen or fail to improve as anticipated.

## 2017-10-27 NOTE — Patient Instructions (Signed)
Great to see you.  Please call the breast center to schedule your mammogram and bone density.

## 2017-10-27 NOTE — Telephone Encounter (Signed)
Completed/thx dmf 

## 2017-10-27 NOTE — Progress Notes (Signed)
Subjective:   Patient ID: Lisa Yu, female    DOB: January 14, 1951, 67 y.o.   MRN: [id number]  Lisa Yu is a pleasant 67 y.o. year old female who presents to clinic today with Follow-up (Patient is here today to F/U with medications. She currently taking Lorazepam and is requesting a refill on this which was last filled 6.11.18.  She is due for a Mammogram and BMD and will call [facility] if a referral is created.)  on 10/27/2017  HPI:  Anxiety- feels she needs to refill her lorazepam.  Only takes rarely but does not have any refills. Last refilled on 01/04/17.  IBS- would like to try rx like Levsin.  Was on this in past and has a friend who has had vast improvement in her symptoms. Having more diarrhea and gas lately. Tried pepto bismol but doesn't want to take this all the time.  Current Outpatient Medications on File Prior to Visit  Medication Sig Dispense Refill  . aspirin 81 MG tablet Take 81 mg by mouth daily.      . Calcium Carbonate-Vitamin D (TH CALCIUM CARBONATE-VITAMIN D) 600-400 MG-UNIT per tablet Take 1 tablet by mouth.     . carboxymethylcellulose (REFRESH PLUS) 0.5 % SOLN 1 drop 3 (three) times daily as needed.    . fluticasone (FLONASE) 50 MCG/ACT nasal spray Place 2 sprays into the nose daily as needed. Reported on 12/27/2015    . lamoTRIgine (LAMICTAL) 200 MG tablet Take 1 tablet (200 mg total) by mouth 2 (two) times daily. 180 tablet 3  . Multiple Vitamin (MULTIVITAMIN) tablet Take 1 tablet by mouth daily.       No current facility-administered medications on file prior to visit.     Allergies  Allergen Reactions  . Other Itching and Rash    Shellfish containing products   . Hydrocodone-Homatropine Hives and Itching  . Penicillins     REACTION: rash    Past Medical History:  Diagnosis Date  . Diverticulosis   . Osteopenia 12/21/2007  . Seizures ([place])    X 1 IN 2009    Past Surgical History:  Procedure Laterality Date  .  AUGMENTATION MAMMAPLASTY Bilateral 1998   removed 2013  . BREAST SURGERY  1983   bilateral implants  . cervical disease  1999   H/O Cryo  . CHONDROPLASTY Right 05/13/2016   Procedure: CHONDROPLASTY;  Surgeon: Dereck Leep, MD;  Location: [facility] ORS;  Service: Orthopedics;  Laterality: Right;  . COLONOSCOPY  05/01/2005  . FOOT SURGERY Bilateral   . KNEE ARTHROSCOPY Right 05/13/2016   Procedure: ARTHROSCOPY KNEE WITH MEDIAL AND LATERAL MENISECTOMY;  Surgeon: Dereck Leep, MD;  Location: [facility] ORS;  Service: Orthopedics;  Laterality: Right;  . LASIK  2007  . MOUTH SURGERY     DENTAL IMPLANTS (TOP)  . VAGINAL DELIVERY     x 1    Family History  Problem Relation Age of Onset  . Cancer Mother        skin    Social History   Socioeconomic History  . Marital status: Married    Spouse name: Ernie Hew  . Number of children: 1  . Years of education: 66 th  . Highest education level: Not on file  Occupational History  . Occupation: Retired from [employer]  . Financial resource strain: Not on file  . Food insecurity:    Worry: Not on file    Inability: Not on file  .  Transportation needs:    Medical: Not on file    Non-medical: Not on file  Tobacco Use  . Smoking status: Never Smoker  . Smokeless tobacco: Never Used  Substance and Sexual Activity  . Alcohol use: No    Alcohol/week: 0.0 oz  . Drug use: No  . Sexual activity: Not on file  Lifestyle  . Physical activity:    Days per week: Not on file    Minutes per session: Not on file  . Stress: Not on file  Relationships  . Social connections:    Talks on phone: Not on file    Gets together: Not on file    Attends religious service: Not on file    Active member of club or organization: Not on file    Attends meetings of clubs or organizations: Not on file    Relationship status: Not on file  . Intimate partner violence:    Fear of current or ex partner: Not on file    Emotionally abused: Not on file     Physically abused: Not on file    Forced sexual activity: Not on file  Other Topics Concern  . Not on file  Social History Narrative   Patient lives at home with her husband Ernie Hew).   Retired and she is self employed.   Education 12 th    Right handed.   Caffeine two cups of coffee one soda daily.      Would desire CPR, no prolonged life support if futile.   The PMH, PSH, Social History, Family History, Medications, and allergies have been reviewed in [facility], and have been updated if relevant.   Review of Systems  Gastrointestinal: Positive for abdominal distention and diarrhea. Negative for abdominal pain, anal bleeding, blood in stool, constipation, nausea, rectal pain and vomiting.  Psychiatric/Behavioral: Negative for agitation, behavioral problems, confusion, decreased concentration, dysphoric mood, hallucinations, self-injury, sleep disturbance and suicidal ideas. The patient is nervous/anxious. The patient is not hyperactive.   All other systems reviewed and are negative.      Objective:    BP 122/78 (BP Location: Left Arm, Patient Position: Sitting, Cuff Size: Normal)   Pulse 81   Temp 98.5 F (36.9 C) (Oral)   Ht 5\' 6"  (1.676 m)   Wt 187 lb (84.8 kg)   SpO2 98%   BMI 30.18 kg/m    Physical Exam  Constitutional: She is oriented to person, place, and time. She appears well-developed and well-nourished. No distress.  HENT:  Head: Normocephalic and atraumatic.  Eyes: Conjunctivae are normal.  Cardiovascular: Normal rate.  Pulmonary/Chest: Effort normal.  Musculoskeletal: Normal range of motion.  Neurological: She is alert and oriented to person, place, and time.  Skin: Skin is warm and dry. She is not diaphoretic.  Psychiatric: She has a normal mood and affect. Her behavior is normal. Judgment and thought content normal.  Nursing note and vitals reviewed.         Assessment & Plan:   No diagnosis found. No follow-ups on file.

## 2017-10-27 NOTE — Telephone Encounter (Signed)
Copied from [place]. Topic: Quick Communication - See Telephone Encounter >> Oct 27, 2017 10:14 AM Ivar Drape wrote: CRM for notification. See Telephone encounter for: 10/27/17. Patient would like a refill of the fluticasone (FLONASE) 50 MCG/ACT nasal spray medication and have it sent to her preferred pharmacy [facility] in [place].

## 2017-11-08 DIAGNOSIS — H2513 Age-related nuclear cataract, bilateral: Secondary | ICD-10-CM | POA: Diagnosis not present

## 2017-11-08 DIAGNOSIS — H1045 Other chronic allergic conjunctivitis: Secondary | ICD-10-CM | POA: Diagnosis not present

## 2017-11-25 ENCOUNTER — Inpatient Hospital Stay: Admission: RE | Admit: 2017-11-25 | Payer: Medicare HMO | Source: Ambulatory Visit

## 2017-11-25 ENCOUNTER — Other Ambulatory Visit: Payer: Medicare HMO

## 2017-11-29 ENCOUNTER — Ambulatory Visit
Admission: RE | Admit: 2017-11-29 | Discharge: 2017-11-29 | Disposition: A | Discharge status: Home / Self Care | Payer: Medicare HMO | Source: Ambulatory Visit | Attending: Family Medicine | Admitting: Family Medicine

## 2017-11-29 ENCOUNTER — Encounter (INDEPENDENT_AMBULATORY_CARE_PROVIDER_SITE_OTHER): Payer: Self-pay

## 2017-11-29 ENCOUNTER — Other Ambulatory Visit: Payer: Self-pay

## 2017-11-29 DIAGNOSIS — Z780 Asymptomatic menopausal state: Secondary | ICD-10-CM | POA: Diagnosis not present

## 2017-11-29 DIAGNOSIS — Z1239 Encounter for other screening for malignant neoplasm of breast: Secondary | ICD-10-CM

## 2017-11-29 DIAGNOSIS — E2839 Other primary ovarian failure: Secondary | ICD-10-CM | POA: Diagnosis not present

## 2017-11-29 DIAGNOSIS — Z1231 Encounter for screening mammogram for malignant neoplasm of breast: Secondary | ICD-10-CM | POA: Insufficient documentation

## 2017-11-29 DIAGNOSIS — M85851 Other specified disorders of bone density and structure, right thigh: Secondary | ICD-10-CM | POA: Insufficient documentation

## 2017-11-29 MED ORDER — HYOSCYAMINE SULFATE 0.125 MG PO TABS: 0.1250 mg | ORAL_TABLET | ORAL | 3 refills | Status: DC | PRN

## 2017-11-30 ENCOUNTER — Ambulatory Visit: Payer: Medicare HMO

## 2017-11-30 ENCOUNTER — Inpatient Hospital Stay: Admission: RE | Admit: 2017-11-30 | Payer: Medicare HMO | Source: Ambulatory Visit

## 2017-12-01 DIAGNOSIS — H1031 Unspecified acute conjunctivitis, right eye: Secondary | ICD-10-CM | POA: Diagnosis not present

## 2017-12-27 ENCOUNTER — Encounter: Payer: Self-pay | Admitting: Family Medicine

## 2017-12-27 ENCOUNTER — Ambulatory Visit (INDEPENDENT_AMBULATORY_CARE_PROVIDER_SITE_OTHER): Payer: Medicare HMO

## 2017-12-27 ENCOUNTER — Ambulatory Visit (INDEPENDENT_AMBULATORY_CARE_PROVIDER_SITE_OTHER): Payer: Medicare HMO | Admitting: Family Medicine

## 2017-12-27 DIAGNOSIS — R059 Cough, unspecified: Secondary | ICD-10-CM | POA: Insufficient documentation

## 2017-12-27 DIAGNOSIS — R05 Cough: Secondary | ICD-10-CM

## 2017-12-27 MED ORDER — DOXYCYCLINE HYCLATE 100 MG PO TABS: 100.0000 mg | ORAL_TABLET | Freq: Two times a day (BID) | ORAL | 0 refills | Status: DC

## 2017-12-27 MED ORDER — HYDROCODONE-HOMATROPINE 5-1.5 MG/5ML PO SYRP: 5.0000 mL | ORAL_SOLUTION | Freq: Three times a day (TID) | ORAL | 0 refills | Status: DC | PRN

## 2017-12-27 NOTE — Progress Notes (Signed)
Subjective:   Patient ID: Lisa Yu, female    DOB: 1950-10-21, 67 y.o.   MRN: [id number]  Lisa Yu is a pleasant 67 y.o. year old female who presents to clinic today with Cough (Patient is here today C/O a cough.  She states that she has had a cough for a month now.  Cough is productive with green sputum and had blood in it this am.  She feels that she cannot get a full breath.  She has used Mucinex, DayQuil but Sx persist.  She would like a CXR today.  The cough is worse at night and she stayed in the bed all yesterday.  Has been running low-grade fever.)  on 12/27/2017  HPI: Cough- ongoing for a month now.  She feels it is worsening.  More productive of green sputum.  This morning it was blood tinged.  She feels she cannot take a full breath because of her cough.  She is taking Mucinex and dayquil without improvement of symptoms.  She is a non smoker.  She is asking for CXR today.  Cough is worse at night- stayed in bed all day yesterday. Has been running a low grade temp last few days.  Current Outpatient Medications on File Prior to Visit  Medication Sig Dispense Refill  . aspirin 81 MG tablet Take 81 mg by mouth daily.      . Calcium Carbonate-Vitamin D (TH CALCIUM CARBONATE-VITAMIN D) 600-400 MG-UNIT per tablet Take 1 tablet by mouth.     . fluticasone (FLONASE) 50 MCG/ACT nasal spray Place 2 sprays into both nostrils daily as needed. Reported on 12/27/2015 16 g prn  . hyoscyamine (LEVSIN) 0.125 MG tablet Take 1 tablet (0.125 mg total) by mouth every 4 (four) hours as needed. 90 tablet 3  . lamoTRIgine (LAMICTAL) 200 MG tablet Take 1 tablet (200 mg total) by mouth 2 (two) times daily. 180 tablet 3  . LORazepam (ATIVAN) 0.5 MG tablet Take 1 tablet (0.5 mg total) by mouth 2 (two) times daily as needed for anxiety or sleep. 60 tablet 2  . Multiple Vitamin (MULTIVITAMIN) tablet Take 1 tablet by mouth daily.       No current facility-administered medications on file prior to  visit.     Allergies  Allergen Reactions  . Other Itching and Rash    Shellfish containing products   . Penicillins     REACTION: rash    Past Medical History:  Diagnosis Date  . Diverticulosis   . Osteopenia 12/21/2007  . Seizures ([place])    X 1 IN 2009    Past Surgical History:  Procedure Laterality Date  . AUGMENTATION MAMMAPLASTY Bilateral 1998   removed 2013  . BREAST SURGERY  1983   bilateral implants  . cervical disease  1999   H/O Cryo  . CHONDROPLASTY Right 05/13/2016   Procedure: CHONDROPLASTY;  Surgeon: Dereck Leep, MD;  Location: [facility] ORS;  Service: Orthopedics;  Laterality: Right;  . COLONOSCOPY  05/01/2005  . FOOT SURGERY Bilateral   . KNEE ARTHROSCOPY Right 05/13/2016   Procedure: ARTHROSCOPY KNEE WITH MEDIAL AND LATERAL MENISECTOMY;  Surgeon: Dereck Leep, MD;  Location: [facility] ORS;  Service: Orthopedics;  Laterality: Right;  . LASIK  2007  . MOUTH SURGERY     DENTAL IMPLANTS (TOP)  . VAGINAL DELIVERY     x 1    Family History  Problem Relation Age of Onset  . Cancer Mother  skin  . Breast cancer Neg Hx     Social History   Socioeconomic History  . Marital status: Married    Spouse name: Lisa Yu  . Number of children: 1  . Years of education: 80 th  . Highest education level: Not on file  Occupational History  . Occupation: Retired from [employer]  . Financial resource strain: Not on file  . Food insecurity:    Worry: Not on file    Inability: Not on file  . Transportation needs:    Medical: Not on file    Non-medical: Not on file  Tobacco Use  . Smoking status: Never Smoker  . Smokeless tobacco: Never Used  Substance and Sexual Activity  . Alcohol use: No    Alcohol/week: 0.0 oz  . Drug use: No  . Sexual activity: Not on file  Lifestyle  . Physical activity:    Days per week: Not on file    Minutes per session: Not on file  . Stress: Not on file  Relationships  . Social connections:    Talks on  phone: Not on file    Gets together: Not on file    Attends religious service: Not on file    Active member of club or organization: Not on file    Attends meetings of clubs or organizations: Not on file    Relationship status: Not on file  . Intimate partner violence:    Fear of current or ex partner: Not on file    Emotionally abused: Not on file    Physically abused: Not on file    Forced sexual activity: Not on file  Other Topics Concern  . Not on file  Social History Narrative   Patient lives at home with her husband Lisa Yu).   Retired and she is self employed.   Education 12 th    Right handed.   Caffeine two cups of coffee one soda daily.      Would desire CPR, no prolonged life support if futile.   The PMH, PSH, Social History, Family History, Medications, and allergies have been reviewed in [facility], and have been updated if relevant.   Review of Systems  Constitutional: Positive for fever.  HENT: Positive for congestion. Negative for sinus pressure, sinus pain, sneezing, sore throat, tinnitus, trouble swallowing and voice change.   Eyes: Negative.   Respiratory: Positive for cough and shortness of breath. Negative for wheezing and stridor.   Cardiovascular: Negative.   Gastrointestinal: Negative.   Endocrine: Negative.   Genitourinary: Negative.   Musculoskeletal: Negative.   Allergic/Immunologic: Negative.   Neurological: Negative.   Hematological: Negative.   Psychiatric/Behavioral: Negative.   All other systems reviewed and are negative.      Objective:    BP 134/86 (BP Location: Left Arm, Patient Position: Sitting, Cuff Size: Normal)   Pulse 82   Temp 98.4 F (36.9 C) (Oral)   Ht 5\' 6"  (1.676 m)   Wt 184 lb 3.2 oz (83.6 kg)   SpO2 96%   BMI 29.73 kg/m    Physical Exam  Constitutional: She is oriented to person, place, and time. She appears well-developed and well-nourished. No distress.  HENT:  Head: Normocephalic and atraumatic.  Mouth/Throat:  Oropharynx is clear and moist.  Eyes: EOM are normal.  Neck: Neck supple.  Cardiovascular: Normal rate.  Pulmonary/Chest: She has wheezes in the right middle field and the right lower field. She has rhonchi in the right lower field.  Musculoskeletal: Normal range of motion.  Neurological: She is alert and oriented to person, place, and time. No cranial nerve deficit.  Skin: Skin is warm and dry. She is not diaphoretic.  Psychiatric: She has a normal mood and affect. Her behavior is normal. Judgment and thought content normal.  Nursing note and vitals reviewed.         Assessment & Plan:   Cough No follow-ups on file.

## 2017-12-27 NOTE — Assessment & Plan Note (Signed)
Persistent and progressive. Signs and symptoms consistent with bronchitis/CAP. Will get CXR - per pt request.  Given progression of symptoms, request is no unreasonable.Treat with doxycyline.  Continue Mucinex, Delsym with hycodan as needed for severe cough. Call or return to clinic prn if these symptoms worsen or fail to improve as anticipated. The patient indicates understanding of these issues and agrees with the plan.

## 2017-12-27 NOTE — Patient Instructions (Signed)
Great to see. I will call you with your chest xray results.  Start the doxycyline 100 mg twice daily for 10 days.  Hycodan as needed for severe cough (this will make you sleepy).

## 2017-12-29 ENCOUNTER — Telehealth: Payer: Self-pay | Admitting: Family Medicine

## 2017-12-29 NOTE — Telephone Encounter (Signed)
Copied from [place] [id number]. Topic: Quick Communication - See Telephone Encounter >> Dec 29, 2017 11:12 AM Robina Ade, Helene Kelp D wrote: CRM for notification. See Telephone encounter for: 12/29/17. Patient called and said that the cough syrup     HYDROcodone-homatropine (HYCODAN) 5-1.5 MG/5ML syrup    gave her a reaction with itchiness and wants to know what to do therefore she would like to talk to Dr. Deborra Medina or her CMA about this.

## 2017-12-30 MED ORDER — PROMETHAZINE-DM 6.25-15 MG/5ML PO SYRP: 5.0000 mL | ORAL_SOLUTION | Freq: Four times a day (QID) | ORAL | 0 refills | Status: DC | PRN

## 2017-12-30 NOTE — Telephone Encounter (Signed)
Noted.  eRx sent for promethazine DM.  This also can cause sedation, so just as with Hycodan, should not be taken prior to driving or using heavy machinery.

## 2017-12-30 NOTE — Telephone Encounter (Signed)
Pt is aware.  

## 2017-12-30 NOTE — Telephone Encounter (Signed)
TA-I spoke to pt/she had itching and also caused her to stay awake at night/She is taking Robitussin but it is not very effective especially at night/Plz advise what you think about an alternative cough syrup? Plz advise/thx dmf

## 2018-01-07 DIAGNOSIS — M1711 Unilateral primary osteoarthritis, right knee: Secondary | ICD-10-CM | POA: Diagnosis not present

## 2018-01-07 DIAGNOSIS — M25561 Pain in right knee: Secondary | ICD-10-CM | POA: Diagnosis not present

## 2018-03-09 ENCOUNTER — Telehealth (INDEPENDENT_AMBULATORY_CARE_PROVIDER_SITE_OTHER): Payer: Self-pay | Admitting: Orthopaedic Surgery

## 2018-03-09 NOTE — Telephone Encounter (Signed)
Patient called,stated she realized she was last seen 2009 by Dr Durward Fortes. Those records did not populate in [facility], I called [facility] and they did have records. I called patient and advised her of this and she will contact [facility] to obtain those records

## 2018-03-23 DIAGNOSIS — M25512 Pain in left shoulder: Secondary | ICD-10-CM | POA: Diagnosis not present

## 2018-03-23 DIAGNOSIS — M2391 Unspecified internal derangement of right knee: Secondary | ICD-10-CM | POA: Diagnosis not present

## 2018-03-23 DIAGNOSIS — M7542 Impingement syndrome of left shoulder: Secondary | ICD-10-CM | POA: Diagnosis not present

## 2018-03-25 ENCOUNTER — Other Ambulatory Visit: Payer: Self-pay | Admitting: Student

## 2018-03-25 DIAGNOSIS — M2391 Unspecified internal derangement of right knee: Secondary | ICD-10-CM

## 2018-03-25 DIAGNOSIS — M25361 Other instability, right knee: Secondary | ICD-10-CM

## 2018-04-04 ENCOUNTER — Ambulatory Visit: Payer: Medicare HMO

## 2018-04-11 ENCOUNTER — Encounter: Payer: Medicare HMO | Admitting: Primary Care

## 2018-04-13 ENCOUNTER — Telehealth: Payer: Self-pay | Admitting: Family Medicine

## 2018-04-13 NOTE — Telephone Encounter (Signed)
Copied from [place] [id number]. Topic: Referral - Request >> Apr 13, 2018  3:54 PM Judyann Munson wrote: Reason for CRM:  Patient is requesting a referral  for a colonoscopy. patient is having trouble with her bowels. She is looking for something in October. Her best contact number [phone]

## 2018-04-14 NOTE — Telephone Encounter (Signed)
Should I have her make an appointment for OV with someone first?

## 2018-04-15 ENCOUNTER — Other Ambulatory Visit: Payer: Self-pay

## 2018-04-15 DIAGNOSIS — Z79899 Other long term (current) drug therapy: Secondary | ICD-10-CM

## 2018-04-15 DIAGNOSIS — M170 Bilateral primary osteoarthritis of knee: Secondary | ICD-10-CM

## 2018-04-15 DIAGNOSIS — E2839 Other primary ovarian failure: Secondary | ICD-10-CM

## 2018-04-15 DIAGNOSIS — M8580 Other specified disorders of bone density and structure, unspecified site: Secondary | ICD-10-CM

## 2018-04-15 DIAGNOSIS — F419 Anxiety disorder, unspecified: Secondary | ICD-10-CM

## 2018-04-15 DIAGNOSIS — E785 Hyperlipidemia, unspecified: Secondary | ICD-10-CM

## 2018-04-15 DIAGNOSIS — D509 Iron deficiency anemia, unspecified: Secondary | ICD-10-CM

## 2018-04-15 DIAGNOSIS — R030 Elevated blood-pressure reading, without diagnosis of hypertension: Secondary | ICD-10-CM

## 2018-04-15 NOTE — Telephone Encounter (Signed)
TA-Looks like she went to [place] in 2006 and would be due to see them again/are we able to re-refer her without seeing her? Or do you want her to have an appt first? Plz advise/thx dmf

## 2018-04-15 NOTE — Telephone Encounter (Signed)
Pt is aware that she may call [facility] to be scheduled/I have scheduled her for an AWV and F/U with TA on same day and created future orders for labs to be done then as well/thx dmf

## 2018-04-15 NOTE — Telephone Encounter (Signed)
Patient called back because she missed a call but did not have a VM. She is requesting call back please if necessary. Thank you

## 2018-04-15 NOTE — Telephone Encounter (Signed)
I do not think she needs another referral. She should contact GI for appt

## 2018-04-15 NOTE — Telephone Encounter (Signed)
Thank you :)

## 2018-04-21 ENCOUNTER — Ambulatory Visit
Admission: RE | Admit: 2018-04-21 | Discharge: 2018-04-21 | Disposition: A | Discharge status: Home / Self Care | Payer: Medicare HMO | Source: Ambulatory Visit | Attending: Student | Admitting: Student

## 2018-04-21 DIAGNOSIS — M2391 Unspecified internal derangement of right knee: Secondary | ICD-10-CM | POA: Insufficient documentation

## 2018-04-21 DIAGNOSIS — M25361 Other instability, right knee: Secondary | ICD-10-CM

## 2018-04-21 DIAGNOSIS — M25561 Pain in right knee: Secondary | ICD-10-CM | POA: Diagnosis not present

## 2018-04-29 DIAGNOSIS — M1711 Unilateral primary osteoarthritis, right knee: Secondary | ICD-10-CM | POA: Diagnosis not present

## 2018-05-12 DIAGNOSIS — M1711 Unilateral primary osteoarthritis, right knee: Secondary | ICD-10-CM | POA: Diagnosis not present

## 2018-05-19 DIAGNOSIS — M1711 Unilateral primary osteoarthritis, right knee: Secondary | ICD-10-CM | POA: Diagnosis not present

## 2018-05-25 ENCOUNTER — Ambulatory Visit: Payer: Medicare HMO | Admitting: Family Medicine

## 2018-05-25 ENCOUNTER — Ambulatory Visit: Payer: Medicare HMO | Admitting: *Deleted

## 2018-05-26 DIAGNOSIS — M1711 Unilateral primary osteoarthritis, right knee: Secondary | ICD-10-CM | POA: Diagnosis not present

## 2018-06-01 ENCOUNTER — Ambulatory Visit: Payer: Medicare HMO | Admitting: Behavioral Health

## 2018-06-01 ENCOUNTER — Ambulatory Visit (INDEPENDENT_AMBULATORY_CARE_PROVIDER_SITE_OTHER): Payer: Medicare HMO | Admitting: Family Medicine

## 2018-06-01 ENCOUNTER — Encounter: Payer: Self-pay | Admitting: Family Medicine

## 2018-06-01 ENCOUNTER — Ambulatory Visit: Payer: Medicare HMO | Admitting: Family Medicine

## 2018-06-01 VITALS — BP 140/84 | HR 74 | Temp 98.3°F | Ht 66.0 in | Wt 179.4 lb

## 2018-06-01 DIAGNOSIS — M545 Low back pain, unspecified: Secondary | ICD-10-CM

## 2018-06-01 DIAGNOSIS — Z23 Encounter for immunization: Secondary | ICD-10-CM

## 2018-06-01 LAB — POCT URINALYSIS DIPSTICK
BILIRUBIN UA: NEGATIVE
Blood, UA: NEGATIVE
GLUCOSE UA: NEGATIVE
KETONES UA: NEGATIVE
Leukocytes, UA: NEGATIVE
Nitrite, UA: NEGATIVE
Protein, UA: NEGATIVE
Spec Grav, UA: 1.025 (ref 1.010–1.025)
Urobilinogen, UA: 0.2 E.U./dL
pH, UA: 6.5 (ref 5.0–8.0)

## 2018-06-01 MED ORDER — KETOROLAC TROMETHAMINE 60 MG/2ML IM SOLN
60.0000 mg | Freq: Once | INTRAMUSCULAR | Status: AC
  Administered 2018-06-01: 60 mg via INTRAMUSCULAR

## 2018-06-01 NOTE — Assessment & Plan Note (Signed)
Acute on chronic lumbar pain without radiculopathy.  Known lumbar DDD.  Exam reassuring. SLR negative bilaterally.  Advised to continue meloxicam (with food), add Tylenol ES 1-2 tab twice daily for the next two weeks.  Exercises as per AVS. IM toradol 60 mg given today as well. Call or return to clinic prn if these symptoms worsen or fail to improve as anticipated. The patient indicates understanding of these issues and agrees with the plan.

## 2018-06-01 NOTE — Patient Instructions (Signed)
Great to see you.  Start taking Tylenol Extra Strength- 1-2  tablet twice daily for the next 2 weeks.   Back Exercises If you have pain in your back, do these exercises 2-3 times each day or as told by your doctor. When the pain goes away, do the exercises once each day, but repeat the steps more times for each exercise (do more repetitions). If you do not have pain in your back, do these exercises once each day or as told by your doctor. Exercises Single Knee to Chest  Do these steps 3-5 times in a row for each leg: 1. Lie on your back on a firm bed or the floor with your legs stretched out. 2. Bring one knee to your chest. 3. Hold your knee to your chest by grabbing your knee or thigh. 4. Pull on your knee until you feel a gentle stretch in your lower back. 5. Keep doing the stretch for 10-30 seconds. 6. Slowly let go of your leg and straighten it.  Pelvic Tilt  Do these steps 5-10 times in a row: 1. Lie on your back on a firm bed or the floor with your legs stretched out. 2. Bend your knees so they point up to the ceiling. Your feet should be flat on the floor. 3. Tighten your lower belly (abdomen) muscles to press your lower back against the floor. This will make your tailbone point up to the ceiling instead of pointing down to your feet or the floor. 4. Stay in this position for 5-10 seconds while you gently tighten your muscles and breathe evenly.  Cat-Cow  Do these steps until your lower back bends more easily: 1. Get on your hands and knees on a firm surface. Keep your hands under your shoulders, and keep your knees under your hips. You may put padding under your knees. 2. Let your head hang down, and make your tailbone point down to the floor so your lower back is round like the back of a cat. 3. Stay in this position for 5 seconds. 4. Slowly lift your head and make your tailbone point up to the ceiling so your back hangs low (sags) like the back of a cow. 5. Stay in this  position for 5 seconds.  Press-Ups  Do these steps 5-10 times in a row: 1. Lie on your belly (face-down) on the floor. 2. Place your hands near your head, about shoulder-width apart. 3. While you keep your back relaxed and keep your hips on the floor, slowly straighten your arms to raise the top half of your body and lift your shoulders. Do not use your back muscles. To make yourself more comfortable, you may change where you place your hands. 4. Stay in this position for 5 seconds. 5. Slowly return to lying flat on the floor.  Bridges  Do these steps 10 times in a row: 1. Lie on your back on a firm surface. 2. Bend your knees so they point up to the ceiling. Your feet should be flat on the floor. 3. Tighten your butt muscles and lift your butt off of the floor until your waist is almost as high as your knees. If you do not feel the muscles working in your butt and the back of your thighs, slide your feet 1-2 inches farther away from your butt. 4. Stay in this position for 3-5 seconds. 5. Slowly lower your butt to the floor, and let your butt muscles relax.  If this exercise  is too easy, try doing it with your arms crossed over your chest. Belly Crunches  Do these steps 5-10 times in a row: 1. Lie on your back on a firm bed or the floor with your legs stretched out. 2. Bend your knees so they point up to the ceiling. Your feet should be flat on the floor. 3. Cross your arms over your chest. 4. Tip your chin a little bit toward your chest but do not bend your neck. 5. Tighten your belly muscles and slowly raise your chest just enough to lift your shoulder blades a tiny bit off of the floor. 6. Slowly lower your chest and your head to the floor.  Back Lifts Do these steps 5-10 times in a row: 1. Lie on your belly (face-down) with your arms at your sides, and rest your forehead on the floor. 2. Tighten the muscles in your legs and your butt. 3. Slowly lift your chest off of the floor  while you keep your hips on the floor. Keep the back of your head in line with the curve in your back. Look at the floor while you do this. 4. Stay in this position for 3-5 seconds. 5. Slowly lower your chest and your face to the floor.  Contact a doctor if:  Your back pain gets a lot worse when you do an exercise.  Your back pain does not lessen 2 hours after you exercise. If you have any of these problems, stop doing the exercises. Do not do them again unless your doctor says it is okay. Get help right away if:  You have sudden, very bad back pain. If this happens, stop doing the exercises. Do not do them again unless your doctor says it is okay. This information is not intended to replace advice given to you by your health care provider. Make sure you discuss any questions you have with your health care provider. Document Released: 08/15/2010 Document Revised: 12/19/2015 Document Reviewed: 09/06/2014 Elsevier Interactive Patient Education  [employer].

## 2018-06-01 NOTE — Addendum Note (Signed)
Addended by: Doy Hutching on: 06/01/2018 11:19 AM   Modules accepted: Orders

## 2018-06-01 NOTE — Progress Notes (Signed)
Subjective:   Patient ID: Lisa Yu, female    DOB: [birth year], 67 y.o.   MRN: [id number]  Lisa Yu is a pleasant 67 y.o. year old female who presents to clinic today with Back Pain (lower back pain started Monday..unsure what she did.)  on 06/01/2018  HPI:  Back pain- lower back pain for 2 days.  No known injury.  No radiculopathy or LE weakness.  Pain is worse when she stands from a seated position or twists or bends.  She does have known significant DDD and OA in other joints (specifically knees)- followed by Dr. Marry Guan.  Just saw him on 05/19/18.  Note reviewed.  She received an Euflexxa injection in her right knee at that [facility]. He also prescribes meloxicam for her knee pain.   Lumbar MRI from 03/23/18 showed-   CLINICAL DATA:  Posterior RIGHT knee pain beginning October 2017. Assess radiculopathy.  EXAM: MRI LUMBAR SPINE WITHOUT CONTRAST  COMPARISON:  MRI lumbar spine January 22, 2007  IMPRESSION: 1. Mildly progressed degenerative change of the lumbar spine without fracture or malalignment. 2. No canal stenosis. Minimal L3-4 and L4-5 neural foraminal narrowing.  Current Outpatient Medications on File Prior to Visit  Medication Sig Dispense Refill  . aspirin 81 MG tablet Take 81 mg by mouth daily.      Marland Kitchen azithromycin (ZITHROMAX) 250 MG tablet azithromycin 250 mg tablet    . Calcium Carbonate-Vitamin D (TH CALCIUM CARBONATE-VITAMIN D) 600-400 MG-UNIT per tablet Take 1 tablet by mouth.     . chlorhexidine (PERIDEX) 0.12 % solution     . doxycycline (VIBRA-TABS) 100 MG tablet Take 1 tablet (100 mg total) by mouth 2 (two) times daily. 20 tablet 0  . etodolac (LODINE) 400 MG tablet etodolac 400 mg tablet    . fluticasone (FLONASE) 50 MCG/ACT nasal spray Place 2 sprays into both nostrils daily as needed. Reported on 12/27/2015 16 g prn  . HYDROcodone-homatropine (HYCODAN) 5-1.5 MG/5ML syrup TAKE 5 MLS BY MOUTH EVERY 8 HOURS AS NEEDED FOR COUGH    . HYDROmorphone (DILAUDID)  2 MG tablet hydromorphone 2 mg tablet    . hyoscyamine (LEVSIN) 0.125 MG tablet Take 1 tablet (0.125 mg total) by mouth every 4 (four) hours as needed. 90 tablet 3  . lamoTRIgine (LAMICTAL) 200 MG tablet Take 1 tablet (200 mg total) by mouth 2 (two) times daily. 180 tablet 3  . LORazepam (ATIVAN) 0.5 MG tablet Take 1 tablet (0.5 mg total) by mouth 2 (two) times daily as needed for anxiety or sleep. 60 tablet 2  . meloxicam (MOBIC) 15 MG tablet     . Multiple Vitamin (MULTIVITAMIN) tablet Take 1 tablet by mouth daily.      Marland Kitchen oxyCODONE (OXY IR/ROXICODONE) 5 MG immediate release tablet oxycodone 5 mg tablet  TAKE 1-2 TABLETS BY MOUTH EVERY 4 HOURS AS NEEDED FOR SEVERE PAIN    . predniSONE (DELTASONE) 10 MG tablet prednisone 10 mg tablet    . promethazine (PHENERGAN) 25 MG tablet promethazine 25 mg tablet  TAKE 1 TABLET BY MOUTH 1 HOUR BEFORE PROCEDURE    . promethazine-dextromethorphan (PROMETHAZINE-DM) 6.25-15 MG/5ML syrup Take 5 mLs by mouth 4 (four) times daily as needed. 118 mL 0  . traMADol (ULTRAM) 50 MG tablet tramadol 50 mg tablet    . traZODone (DESYREL) 50 MG tablet trazodone 50 mg tablet  TAKE 0.5-1 TABLETS (25-50 MG TOTAL) BY MOUTH AT BEDTIME AS NEEDED FOR SLEEP.    Marland Kitchen triazolam (HALCION) 0.25 MG  tablet triazolam 0.25 mg tablet    . valACYclovir (VALTREX) 500 MG tablet valacyclovir 500 mg tablet     No current facility-administered medications on file prior to visit.     Allergies  Allergen Reactions  . Other Itching and Rash    Shellfish containing products   . Penicillins     REACTION: rash    Past Medical History:  Diagnosis Date  . Diverticulosis   . Osteopenia 12/21/2007  . Seizures ([place])    X 1 IN 2009    Past Surgical History:  Procedure Laterality Date  . AUGMENTATION MAMMAPLASTY Bilateral 1998   removed 2013  . BREAST SURGERY  1983   bilateral implants  . cervical disease  1999   H/O Cryo  . CHONDROPLASTY Right 05/13/2016   Procedure: CHONDROPLASTY;   Surgeon: Dereck Leep, MD;  Location: [facility] ORS;  Service: Orthopedics;  Laterality: Right;  . COLONOSCOPY  05/01/2005  . FOOT SURGERY Bilateral   . KNEE ARTHROSCOPY Right 05/13/2016   Procedure: ARTHROSCOPY KNEE WITH MEDIAL AND LATERAL MENISECTOMY;  Surgeon: Dereck Leep, MD;  Location: [facility] ORS;  Service: Orthopedics;  Laterality: Right;  . LASIK  2007  . MOUTH SURGERY     DENTAL IMPLANTS (TOP)  . VAGINAL DELIVERY     x 1    Family History  Problem Relation Age of Onset  . Cancer Mother        skin  . Breast cancer Neg Hx     Social History   Socioeconomic History  . Marital status: Married    Spouse name: Ernie Hew  . Number of children: 1  . Years of education: 10 th  . Highest education level: Not on file  Occupational History  . Occupation: Retired from [employer]  . Financial resource strain: Not on file  . Food insecurity:    Worry: Not on file    Inability: Not on file  . Transportation needs:    Medical: Not on file    Non-medical: Not on file  Tobacco Use  . Smoking status: Never Smoker  . Smokeless tobacco: Never Used  Substance and Sexual Activity  . Alcohol use: No    Alcohol/week: 0.0 standard drinks  . Drug use: No  . Sexual activity: Not on file  Lifestyle  . Physical activity:    Days per week: Not on file    Minutes per session: Not on file  . Stress: Not on file  Relationships  . Social connections:    Talks on phone: Not on file    Gets together: Not on file    Attends religious service: Not on file    Active member of club or organization: Not on file    Attends meetings of clubs or organizations: Not on file    Relationship status: Not on file  . Intimate partner violence:    Fear of current or ex partner: Not on file    Emotionally abused: Not on file    Physically abused: Not on file    Forced sexual activity: Not on file  Other Topics Concern  . Not on file  Social History Narrative   Patient lives at home with  her husband Ernie Hew).   Retired and she is self employed.   Education 12 th    Right handed.   Caffeine two cups of coffee one soda daily.      Would desire CPR, no prolonged life support if futile.  The PMH, PSH, Social History, Family History, Medications, and allergies have been reviewed in [facility], and have been updated if relevant.   Review of Systems  Constitutional: Negative.   Musculoskeletal: Positive for arthralgias and back pain. Negative for gait problem.  Neurological: Negative.   Hematological: Negative.   Psychiatric/Behavioral: Negative.   All other systems reviewed and are negative.      Objective:    BP 140/84 (BP Location: Left Arm, Patient Position: Sitting, Cuff Size: Normal)   Pulse 74   Temp 98.3 F (36.8 C) (Oral)   Ht 5\' 6"  (1.676 m)   Wt 179 lb 6.4 oz (81.4 kg)   SpO2 96%   BMI 28.96 kg/m    Physical Exam  Constitutional: She is oriented to person, place, and time. She appears well-developed and well-nourished. No distress.  HENT:  Head: Normocephalic and atraumatic.  Eyes: EOM are normal.  Neck: Normal range of motion.  Cardiovascular: Normal rate.  Pulmonary/Chest: Effort normal.  Musculoskeletal:       Lumbar back: She exhibits decreased range of motion, tenderness and pain. She exhibits no bony tenderness, no swelling, no edema, no deformity, no laceration, no spasm and normal pulse.  SLR negative bilaterally.  Neurological: She is alert and oriented to person, place, and time. No cranial nerve deficit.  Skin: Skin is warm and dry. She is not diaphoretic.  Psychiatric: She has a normal mood and affect. Her behavior is normal. Judgment and thought content normal.  Nursing note and vitals reviewed.         Assessment & Plan:   Acute midline low back pain without sciatica No follow-ups on file.

## 2018-06-02 ENCOUNTER — Telehealth: Payer: Self-pay | Admitting: Family Medicine

## 2018-06-02 NOTE — Telephone Encounter (Signed)
Copied from [place] [id number]. Topic: General - Other >> Jun 02, 2018  2:16 PM Lennox Solders wrote: Reason for CRM: pt is calling she saw dr Deborra Medina yesterday . Pt was seen yesterday for back pain. Pt is still having pain. Pt is using ice, heat, tylenol and generic mobic. Please advice. [facility] [address]

## 2018-06-03 DIAGNOSIS — J986 Disorders of diaphragm: Secondary | ICD-10-CM | POA: Diagnosis not present

## 2018-06-03 DIAGNOSIS — R1084 Generalized abdominal pain: Secondary | ICD-10-CM | POA: Diagnosis not present

## 2018-06-03 DIAGNOSIS — M533 Sacrococcygeal disorders, not elsewhere classified: Secondary | ICD-10-CM | POA: Diagnosis not present

## 2018-06-03 DIAGNOSIS — M545 Low back pain: Secondary | ICD-10-CM | POA: Diagnosis not present

## 2018-06-03 NOTE — Telephone Encounter (Signed)
Can you check and see how she is feeling today?  It looks like she called yesterday early afternoon.  Back pain can take several days to resolve.  Is she feeling any better?

## 2018-06-05 ENCOUNTER — Emergency Department: Payer: Medicare HMO

## 2018-06-05 ENCOUNTER — Emergency Department
Admission: EM | Admit: 2018-06-05 | Discharge: 2018-06-05 | Disposition: A | Discharge status: Home / Self Care | Payer: Medicare HMO | Attending: Emergency Medicine | Admitting: Emergency Medicine

## 2018-06-05 DIAGNOSIS — Z7982 Long term (current) use of aspirin: Secondary | ICD-10-CM | POA: Insufficient documentation

## 2018-06-05 DIAGNOSIS — Z79899 Other long term (current) drug therapy: Secondary | ICD-10-CM | POA: Diagnosis not present

## 2018-06-05 DIAGNOSIS — R1031 Right lower quadrant pain: Secondary | ICD-10-CM | POA: Diagnosis not present

## 2018-06-05 DIAGNOSIS — M5441 Lumbago with sciatica, right side: Secondary | ICD-10-CM | POA: Insufficient documentation

## 2018-06-05 DIAGNOSIS — R109 Unspecified abdominal pain: Secondary | ICD-10-CM

## 2018-06-05 DIAGNOSIS — K5730 Diverticulosis of large intestine without perforation or abscess without bleeding: Secondary | ICD-10-CM | POA: Diagnosis not present

## 2018-06-05 LAB — URINALYSIS, COMPLETE (UACMP) WITH MICROSCOPIC
BACTERIA UA: NONE SEEN
Bilirubin Urine: NEGATIVE
GLUCOSE, UA: NEGATIVE mg/dL
Hgb urine dipstick: NEGATIVE
KETONES UR: 20 mg/dL — AB
Nitrite: NEGATIVE
PROTEIN: NEGATIVE mg/dL
Specific Gravity, Urine: 1.005 (ref 1.005–1.030)
pH: 7 (ref 5.0–8.0)

## 2018-06-05 LAB — COMPREHENSIVE METABOLIC PANEL
ALBUMIN: 4.4 g/dL (ref 3.5–5.0)
ALK PHOS: 54 U/L (ref 38–126)
ALT: 34 U/L (ref 0–44)
AST: 28 U/L (ref 15–41)
Anion gap: 12 (ref 5–15)
BILIRUBIN TOTAL: 0.7 mg/dL (ref 0.3–1.2)
BUN: 13 mg/dL (ref 8–23)
CALCIUM: 9.6 mg/dL (ref 8.9–10.3)
CO2: 25 mmol/L (ref 22–32)
Chloride: 102 mmol/L (ref 98–111)
Creatinine, Ser: 0.6 mg/dL (ref 0.44–1.00)
GLUCOSE: 93 mg/dL (ref 70–99)
Potassium: 3.6 mmol/L (ref 3.5–5.1)
Sodium: 139 mmol/L (ref 135–145)
Total Protein: 7.7 g/dL (ref 6.5–8.1)

## 2018-06-05 LAB — CBC
HEMATOCRIT: 41.5 % (ref 36.0–46.0)
Hemoglobin: 13.7 g/dL (ref 12.0–15.0)
MCH: 29.9 pg (ref 26.0–34.0)
MCHC: 33 g/dL (ref 30.0–36.0)
MCV: 90.6 fL (ref 80.0–100.0)
PLATELETS: 321 10*3/uL (ref 150–400)
RBC: 4.58 MIL/uL (ref 3.87–5.11)
RDW: 14 % (ref 11.5–15.5)
WBC: 7.5 10*3/uL (ref 4.0–10.5)
nRBC: 0 % (ref 0.0–0.2)

## 2018-06-05 LAB — LIPASE, BLOOD: Lipase: 18 U/L (ref 11–51)

## 2018-06-05 MED ORDER — HYDROCODONE-ACETAMINOPHEN 5-325 MG PO TABS: 1.0000 | ORAL_TABLET | ORAL | 0 refills | Status: DC | PRN

## 2018-06-05 MED ORDER — ONDANSETRON HCL 4 MG/2ML IJ SOLN
4.0000 mg | Freq: Once | INTRAMUSCULAR | Status: AC
  Administered 2018-06-05: 4 mg via INTRAVENOUS
  Filled 2018-06-05: qty 2

## 2018-06-05 MED ORDER — MORPHINE SULFATE (PF) 4 MG/ML IV SOLN
4.0000 mg | Freq: Once | INTRAVENOUS | Status: AC
  Administered 2018-06-05: 4 mg via INTRAVENOUS
  Filled 2018-06-05: qty 1

## 2018-06-05 MED ORDER — SODIUM CHLORIDE 0.9 % IV BOLUS
1000.0000 mL | Freq: Once | INTRAVENOUS | Status: AC
  Administered 2018-06-05: 1000 mL via INTRAVENOUS

## 2018-06-05 MED ORDER — DEXAMETHASONE SODIUM PHOSPHATE 10 MG/ML IJ SOLN
10.0000 mg | Freq: Once | INTRAMUSCULAR | Status: AC
  Administered 2018-06-05: 10 mg via INTRAVENOUS
  Filled 2018-06-05: qty 1

## 2018-06-05 NOTE — ED Notes (Signed)
ED Provider at bedside. 

## 2018-06-05 NOTE — ED Triage Notes (Signed)
First Nurse Note:  C/O right flank pain x 2 days.  States pain started Tuesday..  Seen through PCP and Urgent Care.  States no improvement.  AAOx3.  Skin warm and dry.  Ambulates with easy and steady gait.  NAD

## 2018-06-05 NOTE — ED Notes (Signed)
Pt  Was not catheterized

## 2018-06-05 NOTE — ED Triage Notes (Signed)
Pt presents via POV c/o right flank pain extending in the RLQ of abd. Reports pain with palpation to RLQ. Seen PCP on Wednesday.

## 2018-06-05 NOTE — ED Notes (Signed)
Pt reports taking Cipro from PCP since Friday.

## 2018-06-05 NOTE — ED Provider Notes (Signed)
[facility] Emergency Department Provider Note  ___________________________________________   First MD Initiated Contact with Patient 06/05/18 1403     (approximate)  I have reviewed the triage vital signs and the nursing notes.   HISTORY  Chief Complaint Abdominal Pain and Flank Pain   HPI Lisa Yu is a 67 y.o. female with a history of diverticulosis as well as seizure disorder who was dealing with 3 days of right lower quadrant abdominal pain.  Says the pain is a sharp and a 10 out of 10.  It originates in her right lower flank and radiates around to her right groin.  It is associated with nausea but no vomiting.  Has been seen twice outpatient for this and been given a Toradol shot as well as tramadol which is not relieving the pain.  Also put on Cipro for possible UTI.  Not reporting any fever or chills.  Patient says the pain is not worsened with movement.  Past Medical History:  Diagnosis Date  . Diverticulosis   . Osteopenia 12/21/2007  . Seizures ([place])    X 1 IN 2009    Patient Active Problem List   Diagnosis Date Noted  . Cough 12/27/2017  . Anxiety 10/27/2017  . OA (osteoarthritis) of knee 03/22/2017  . Grief 01/04/2017  . Insomnia 01/16/2016  . Complex partial seizure ([place]) 05/10/2014  . IBS (irritable bowel syndrome) 11/30/2013  . Postmenopausal HRT (hormone replacement therapy) 11/30/2013  . Elevated blood pressure reading without diagnosis of hypertension 06/20/2013  . Partial epilepsy with impairment of consciousness ([place]) 05/09/2013  . Iron deficiency anemia, unspecified 01/23/2011  . Convulsions ([place]) 02/25/2008  . DIVERTICULOSIS, COLON 12/27/2007  . STATE, SYMPTOMATIC MENOPAUSE/FEM CLIMACTERIC 12/27/2007  . SPONDYLOSIS, LUMBAR 12/27/2007  . OSTEOPENIA 12/27/2007  . BREAST IMPLANTS, BILATERAL, HX OF 12/27/2007  . Low back pain 01/12/2007    Past Surgical History:  Procedure Laterality Date  . AUGMENTATION MAMMAPLASTY  Bilateral 1998   removed 2013  . BREAST SURGERY  1983   bilateral implants  . cervical disease  1999   H/O Cryo  . CHONDROPLASTY Right 05/13/2016   Procedure: CHONDROPLASTY;  Surgeon: Dereck Leep, MD;  Location: [facility] ORS;  Service: Orthopedics;  Laterality: Right;  . COLONOSCOPY  05/01/2005  . FOOT SURGERY Bilateral   . KNEE ARTHROSCOPY Right 05/13/2016   Procedure: ARTHROSCOPY KNEE WITH MEDIAL AND LATERAL MENISECTOMY;  Surgeon: Dereck Leep, MD;  Location: [facility] ORS;  Service: Orthopedics;  Laterality: Right;  . LASIK  2007  . MOUTH SURGERY     DENTAL IMPLANTS (TOP)  . VAGINAL DELIVERY     x 1    Prior to Admission medications   Medication Sig Start Date End Date Taking? Authorizing Provider  aspirin 81 MG tablet Take 81 mg by mouth daily.      [provider]  azithromycin (ZITHROMAX) 250 MG tablet azithromycin 250 mg tablet    [provider]  Calcium Carbonate-Vitamin D (TH CALCIUM CARBONATE-VITAMIN D) 600-400 MG-UNIT per tablet Take 1 tablet by mouth.     [provider]  chlorhexidine (PERIDEX) 0.12 % solution  05/31/18   [provider]  doxycycline (VIBRA-TABS) 100 MG tablet Take 1 tablet (100 mg total) by mouth 2 (two) times daily. 12/27/17   Lucille Passy, MD  etodolac (LODINE) 400 MG tablet etodolac 400 mg tablet    [provider]  fluticasone (FLONASE) 50 MCG/ACT nasal spray Place 2 sprays into both nostrils daily as  needed. Reported on 12/27/2015 10/27/17   Lucille Passy, MD  HYDROcodone-homatropine [facility]) 5-1.5 MG/5ML syrup TAKE 5 MLS BY MOUTH EVERY 8 HOURS AS NEEDED FOR COUGH 12/27/17   [provider]  HYDROmorphone (DILAUDID) 2 MG tablet hydromorphone 2 mg tablet    [provider]  hyoscyamine (LEVSIN) 0.125 MG tablet Take 1 tablet (0.125 mg total) by mouth every 4 (four) hours as needed. 11/29/17   Lucille Passy, MD  lamoTRIgine (LAMICTAL) 200 MG tablet Take 1 tablet (200 mg total) by mouth 2 (two) times  daily. 05/24/17   Dennie Bible, NP  LORazepam (ATIVAN) 0.5 MG tablet Take 1 tablet (0.5 mg total) by mouth 2 (two) times daily as needed for anxiety or sleep. 10/27/17   Lucille Passy, MD  meloxicam [facility]) 15 MG tablet  05/17/18   [provider]  Multiple Vitamin (MULTIVITAMIN) tablet Take 1 tablet by mouth daily.      [provider]  oxyCODONE (OXY IR/ROXICODONE) 5 MG immediate release tablet oxycodone 5 mg tablet  TAKE 1-2 TABLETS BY MOUTH EVERY 4 HOURS AS NEEDED FOR SEVERE PAIN    [provider]  predniSONE (DELTASONE) 10 MG tablet prednisone 10 mg tablet 02/16/17   [provider]  promethazine (PHENERGAN) 25 MG tablet promethazine 25 mg tablet  TAKE 1 TABLET BY MOUTH 1 HOUR BEFORE PROCEDURE    [provider]  promethazine-dextromethorphan (PROMETHAZINE-DM) 6.25-15 MG/5ML syrup Take 5 mLs by mouth 4 (four) times daily as needed. 12/30/17   Lucille Passy, MD  traMADol (ULTRAM) 50 MG tablet tramadol 50 mg tablet    [provider]  traZODone (DESYREL) 50 MG tablet trazodone 50 mg tablet  TAKE 0.5-1 TABLETS (25-50 MG TOTAL) BY MOUTH AT BEDTIME AS NEEDED FOR SLEEP.    [provider]  triazolam (HALCION) 0.25 MG tablet triazolam 0.25 mg tablet    [provider]  valACYclovir (VALTREX) 500 MG tablet valacyclovir 500 mg tablet    [provider]    Allergies Other and Penicillins  Family History  Problem Relation Age of Onset  . Cancer Mother        skin  . Breast cancer Neg Hx     Social History Social History   Tobacco Use  . Smoking status: Never Smoker  . Smokeless tobacco: Never Used  Substance Use Topics  . Alcohol use: No    Alcohol/week: 0.0 standard drinks  . Drug use: No    Review of Systems  Constitutional: No fever/chills Eyes: No visual changes. ENT: No sore throat. Cardiovascular: Denies chest pain. Respiratory: Denies shortness of breath. Gastrointestinal:no vomiting.   No diarrhea.  No constipation. Genitourinary: Negative for dysuria. Musculoskeletal: As above Skin: Negative for rash. Neurological: Negative for headaches, focal weakness or numbness.   ____________________________________________   PHYSICAL EXAM:  VITAL SIGNS: ED Triage Vitals  Enc Vitals Group     BP 06/05/18 1406 (!) 180/87     Pulse Rate 06/05/18 1406 84     Resp 06/05/18 1408 14     Temp 06/05/18 1408 99 F (37.2 C)     Temp Source 06/05/18 1408 Oral     SpO2 06/05/18 1406 98 %     Weight 06/05/18 1409 177 lb (80.3 kg)     Height --      Head Circumference --      Peak Flow --      Pain Score 06/05/18 1409 10     Pain Loc --  Pain Edu? --      Excl. in [place]? --     Constitutional: Alert and oriented.  Peers mildly uncomfortable Eyes: Conjunctivae are normal.  Head: Atraumatic. Nose: No congestion/rhinnorhea. Mouth/Throat: Mucous membranes are moist.  Neck: No stridor.   Cardiovascular: Normal rate, regular rhythm. Grossly normal heart sounds.   Respiratory: Normal respiratory effort.  No retractions. Lungs CTAB. Gastrointestinal: Soft and nontender. No distention. No CVA tenderness. Musculoskeletal: No lower extremity tenderness nor edema.  No joint effusions. Neurologic:  Normal speech and language. No gross focal neurologic deficits are appreciated. Skin:  Skin is warm, dry and intact. No rash noted. Psychiatric: Mood and affect are normal. Speech and behavior are normal.  ____________________________________________   LABS (all labs ordered are listed, but only abnormal results are displayed)  Labs Reviewed  LIPASE, BLOOD  COMPREHENSIVE METABOLIC PANEL  CBC  URINALYSIS, COMPLETE (UACMP) WITH MICROSCOPIC   ____________________________________________  EKG   ____________________________________________  RADIOLOGY  Pending CT renal ____________________________________________   PROCEDURES  Procedure(s) performed:    Procedures  Critical Care performed:   ____________________________________________   INITIAL IMPRESSION / ASSESSMENT AND PLAN / ED COURSE  Pertinent labs & imaging results that were available during my care of the patient were reviewed by me and considered in my medical decision making (see chart for details).  Differential diagnosis includes, but is not limited to, ovarian cyst, ovarian torsion, acute appendicitis, diverticulitis, urinary tract infection/pyelonephritis, endometriosis, bowel obstruction, colitis, renal colic, gastroenteritis, hernia, fibroids, endometriosis, pregnancy related pain including ectopic pregnancy, etc. As part of my medical decision making, I reviewed the following data within the electronic MEDICAL RECORD NUMBER Notes from prior outpatient visits  ----------------------------------------- 3:06 PM on 06/05/2018 -----------------------------------------  Patient is pending urinalysis as well as CT read at this time.  Signed with Dr. Corky Downs.  Suspect kidney stone. ____________________________________________   FINAL CLINICAL IMPRESSION(S) / ED DIAGNOSES  Right flank pain   NEW MEDICATIONS STARTED DURING THIS VISIT:  New Prescriptions   No medications on file     Note:  This document was prepared using Dragon voice recognition software and may include unintentional dictation errors.     Orbie Pyo, MD 06/05/18 [phone]

## 2018-06-06 NOTE — Telephone Encounter (Signed)
Her back pain was increasing-went to the ER yesterday due to no improvement in her pain. CT scan done-states may be a kidney stone. She is taking Norco to manage her pain. Has a follow up visit scheduled for 11/19 to see you. Informed her to contact our office if her symptoms worsen. Offered an appointment with Dr. Raeford Razor to evaluate back pain. She declined, wanted to wait to follow up with you.

## 2018-06-07 ENCOUNTER — Other Ambulatory Visit: Payer: Self-pay

## 2018-06-07 ENCOUNTER — Emergency Department
Admission: EM | Admit: 2018-06-07 | Discharge: 2018-06-07 | Disposition: A | Discharge status: Home / Self Care | Payer: Medicare HMO | Attending: Emergency Medicine | Admitting: Emergency Medicine

## 2018-06-07 ENCOUNTER — Emergency Department: Payer: Medicare HMO

## 2018-06-07 ENCOUNTER — Encounter: Payer: Self-pay | Admitting: Emergency Medicine

## 2018-06-07 DIAGNOSIS — M47816 Spondylosis without myelopathy or radiculopathy, lumbar region: Secondary | ICD-10-CM | POA: Diagnosis not present

## 2018-06-07 DIAGNOSIS — M5417 Radiculopathy, lumbosacral region: Secondary | ICD-10-CM | POA: Diagnosis not present

## 2018-06-07 DIAGNOSIS — M5136 Other intervertebral disc degeneration, lumbar region: Secondary | ICD-10-CM | POA: Diagnosis not present

## 2018-06-07 DIAGNOSIS — R1084 Generalized abdominal pain: Secondary | ICD-10-CM | POA: Diagnosis present

## 2018-06-07 DIAGNOSIS — M5126 Other intervertebral disc displacement, lumbar region: Secondary | ICD-10-CM | POA: Diagnosis not present

## 2018-06-07 LAB — URINALYSIS, COMPLETE (UACMP) WITH MICROSCOPIC
BACTERIA UA: NONE SEEN
BILIRUBIN URINE: NEGATIVE
Glucose, UA: NEGATIVE mg/dL
Hgb urine dipstick: NEGATIVE
KETONES UR: 5 mg/dL — AB
Nitrite: NEGATIVE
Protein, ur: NEGATIVE mg/dL
Specific Gravity, Urine: 1.012 (ref 1.005–1.030)
pH: 7 (ref 5.0–8.0)

## 2018-06-07 LAB — CBC WITH DIFFERENTIAL/PLATELET
Abs Immature Granulocytes: 0.03 10*3/uL (ref 0.00–0.07)
BASOS ABS: 0 10*3/uL (ref 0.0–0.1)
BASOS PCT: 0 %
Eosinophils Absolute: 0 10*3/uL (ref 0.0–0.5)
Eosinophils Relative: 0 %
HCT: 40.1 % (ref 36.0–46.0)
HEMOGLOBIN: 13.2 g/dL (ref 12.0–15.0)
Immature Granulocytes: 0 %
Lymphocytes Relative: 34 %
Lymphs Abs: 3.2 10*3/uL (ref 0.7–4.0)
MCH: 29.9 pg (ref 26.0–34.0)
MCHC: 32.9 g/dL (ref 30.0–36.0)
MCV: 90.7 fL (ref 80.0–100.0)
Monocytes Absolute: 0.8 10*3/uL (ref 0.1–1.0)
Monocytes Relative: 9 %
NEUTROS ABS: 5.3 10*3/uL (ref 1.7–7.7)
NRBC: 0 % (ref 0.0–0.2)
Neutrophils Relative %: 57 %
PLATELETS: 336 10*3/uL (ref 150–400)
RBC: 4.42 MIL/uL (ref 3.87–5.11)
RDW: 14.2 % (ref 11.5–15.5)
WBC: 9.4 10*3/uL (ref 4.0–10.5)

## 2018-06-07 LAB — COMPREHENSIVE METABOLIC PANEL
ALBUMIN: 4.1 g/dL (ref 3.5–5.0)
ALK PHOS: 52 U/L (ref 38–126)
ALT: 29 U/L (ref 0–44)
AST: 26 U/L (ref 15–41)
Anion gap: 11 (ref 5–15)
BILIRUBIN TOTAL: 0.6 mg/dL (ref 0.3–1.2)
BUN: 24 mg/dL — AB (ref 8–23)
CHLORIDE: 106 mmol/L (ref 98–111)
CO2: 25 mmol/L (ref 22–32)
CREATININE: 0.59 mg/dL (ref 0.44–1.00)
Calcium: 9.3 mg/dL (ref 8.9–10.3)
GFR calc Af Amer: >60 mL/min (ref >60)
GLUCOSE: 93 mg/dL (ref 70–99)
Potassium: 3.3 mmol/L — ABNORMAL LOW (ref 3.5–5.1)
SODIUM: 142 mmol/L (ref 135–145)
TOTAL PROTEIN: 7.5 g/dL (ref 6.5–8.1)

## 2018-06-07 LAB — SEDIMENTATION RATE: Sed Rate: 21 mm/hr (ref 0–30)

## 2018-06-07 MED ORDER — KETOROLAC TROMETHAMINE 30 MG/ML IJ SOLN
30.0000 mg | Freq: Once | INTRAMUSCULAR | Status: AC
  Administered 2018-06-07: 30 mg via INTRAVENOUS
  Filled 2018-06-07: qty 1

## 2018-06-07 MED ORDER — PREDNISONE 10 MG (21) PO TBPK: ORAL_TABLET | ORAL | 0 refills | Status: DC

## 2018-06-07 MED ORDER — HYDROCODONE-ACETAMINOPHEN 5-325 MG PO TABS
2.0000 | ORAL_TABLET | Freq: Once | ORAL | Status: AC
  Administered 2018-06-07: 2 via ORAL
  Filled 2018-06-07: qty 2

## 2018-06-07 MED ORDER — DEXAMETHASONE SODIUM PHOSPHATE 10 MG/ML IJ SOLN
10.0000 mg | Freq: Once | INTRAMUSCULAR | Status: AC
  Administered 2018-06-07: 10 mg via INTRAVENOUS
  Filled 2018-06-07: qty 1

## 2018-06-07 MED ORDER — HYDROMORPHONE HCL 2 MG PO TABS: 2.0000 mg | ORAL_TABLET | Freq: Two times a day (BID) | ORAL | 0 refills | Status: DC | PRN

## 2018-06-07 MED ORDER — SODIUM CHLORIDE 0.9 % IV SOLN
Freq: Once | INTRAVENOUS | Status: AC
  Administered 2018-06-07: 10:00:00 via INTRAVENOUS

## 2018-06-07 NOTE — ED Notes (Signed)
Patient ambulatory to lobby with steady gait and NAD noted. Verbalized understanding of discharge instructions and follow-up care.  

## 2018-06-07 NOTE — ED Notes (Signed)
Patient returned from MRI.

## 2018-06-07 NOTE — ED Provider Notes (Signed)
[facility] Emergency Department Provider Note       Time seen: ----------------------------------------- 7:05 AM on 06/07/2018 -----------------------------------------   I have reviewed the triage vital signs and the nursing notes.  HISTORY   Chief Complaint Back Pain   HPI Lisa Yu is a 67 y.o. female with a history of seizures, insomnia, IBS, anxiety who presents to the ED for persistent flank pain.  Patient was seen here on Sunday for same with no known findings.  Patient describes persistent radicular pain into her right groin.  She denies fevers or chills, denies trouble with her bowel or bladder function.  She does have difficulty walking.  Past Medical History:  Diagnosis Date  . Diverticulosis   . Osteopenia 12/21/2007  . Seizures ([place])    X 1 IN 2009    Patient Active Problem List   Diagnosis Date Noted  . Cough 12/27/2017  . Anxiety 10/27/2017  . OA (osteoarthritis) of knee 03/22/2017  . Grief 01/04/2017  . Insomnia 01/16/2016  . Complex partial seizure ([place]) 05/10/2014  . IBS (irritable bowel syndrome) 11/30/2013  . Postmenopausal HRT (hormone replacement therapy) 11/30/2013  . Elevated blood pressure reading without diagnosis of hypertension 06/20/2013  . Partial epilepsy with impairment of consciousness ([place]) 05/09/2013  . Iron deficiency anemia, unspecified 01/23/2011  . Convulsions ([place]) 02/25/2008  . DIVERTICULOSIS, COLON 12/27/2007  . STATE, SYMPTOMATIC MENOPAUSE/FEM CLIMACTERIC 12/27/2007  . SPONDYLOSIS, LUMBAR 12/27/2007  . OSTEOPENIA 12/27/2007  . BREAST IMPLANTS, BILATERAL, HX OF 12/27/2007  . Low back pain 01/12/2007    Past Surgical History:  Procedure Laterality Date  . AUGMENTATION MAMMAPLASTY Bilateral 1998   removed 2013  . BREAST SURGERY  1983   bilateral implants  . cervical disease  1999   H/O Cryo  . CHONDROPLASTY Right 05/13/2016   Procedure: CHONDROPLASTY;  Surgeon: Dereck Leep, MD;   Location: [facility] ORS;  Service: Orthopedics;  Laterality: Right;  . COLONOSCOPY  05/01/2005  . FOOT SURGERY Bilateral   . KNEE ARTHROSCOPY Right 05/13/2016   Procedure: ARTHROSCOPY KNEE WITH MEDIAL AND LATERAL MENISECTOMY;  Surgeon: Dereck Leep, MD;  Location: [facility] ORS;  Service: Orthopedics;  Laterality: Right;  . LASIK  2007  . MOUTH SURGERY     DENTAL IMPLANTS (TOP)  . VAGINAL DELIVERY     x 1    Allergies Other and Penicillins  Social History Social History   Tobacco Use  . Smoking status: Never Smoker  . Smokeless tobacco: Never Used  Substance Use Topics  . Alcohol use: No    Alcohol/week: 0.0 standard drinks  . Drug use: No   Review of Systems Constitutional: Negative for fever. Cardiovascular: Negative for chest pain. Respiratory: Negative for shortness of breath. Gastrointestinal: Negative for abdominal pain, vomiting and diarrhea. Musculoskeletal: Positive for back pain Skin: Negative for rash. Neurological: Negative for headaches, focal weakness or numbness.  All systems negative/normal/unremarkable except as stated in the HPI  ____________________________________________   PHYSICAL EXAM:  VITAL SIGNS: ED Triage Vitals  Enc Vitals Group     BP 06/07/18 0648 (!) 153/78     Pulse Rate 06/07/18 0648 86     Resp 06/07/18 0648 16     Temp 06/07/18 0648 97.9 F (36.6 C)     Temp Source 06/07/18 0648 Oral     SpO2 06/07/18 0648 95 %     Weight 06/07/18 0647 178 lb (80.7 kg)     Height 06/07/18 0647 5\' 6"  (1.676 m)  Head Circumference --      Peak Flow --      Pain Score 06/07/18 0646 8     Pain Loc --      Pain Edu? --      Excl. in [place]? --     Constitutional: Alert and oriented. Well appearing and in no distress. Cardiovascular: Normal rate, regular rhythm. No murmurs, rubs, or gallops. Respiratory: Normal respiratory effort without tachypnea nor retractions. Breath sounds are clear and equal bilaterally. No  wheezes/rales/rhonchi. Gastrointestinal: Soft and nontender. Normal bowel sounds Musculoskeletal: Nontender with normal range of motion in extremities. No lower extremity tenderness nor edema.  Positive cross straight leg raise examination Neurologic:  Normal speech and language. No gross focal neurologic deficits are appreciated.  It appears to be radicular pain in L1 or L2 distribution on the right leg and groin Skin:  Skin is warm, dry and intact. No rash noted. ____________________________________________  ED COURSE:  As part of my medical decision making, I reviewed the following data within the [place] History obtained from family if available, nursing notes, old chart and ekg, as well as notes from prior ED visits. Patient presented for radicular back pain, we will assess with labs and imaging as indicated at this time.   Procedures ____________________________________________   LABS (pertinent positives/negatives)  Labs Reviewed  COMPREHENSIVE METABOLIC PANEL - Abnormal; Notable for the following components:      Result Value   Potassium 3.3 (*)    BUN 24 (*)    All other components within normal limits  URINALYSIS, COMPLETE (UACMP) WITH MICROSCOPIC - Abnormal; Notable for the following components:   Color, Urine YELLOW (*)    APPearance CLEAR (*)    Ketones, ur 5 (*)    Leukocytes, UA TRACE (*)    All other components within normal limits  CBC WITH DIFFERENTIAL/PLATELET  SEDIMENTATION RATE    RADIOLOGY Images were viewed by me  MRI lumbar spine IMPRESSION: 1. At L1-2 there is a right paracentral/foraminal disc extrusion with cephalad migration of disc material with mass effect on the right intraspinal L1 nerve root. Mild right foraminal stenosis. Mild bilateral facet arthropathy. 2. At L2-3 there is a broad-based disc bulge flattening the ventral thecal sac. Bilateral lateral recess narrowing. Mild bilateral  facet Arthropathy. _________________________________________  DIFFERENTIAL DIAGNOSIS   Sciatica, renal colic, UTI, arthritis, chronic pain  FINAL ASSESSMENT AND PLAN  Sciatica, L1 radiculopathy   Plan: The patient had presented for persistent radicular right low back pain. Patient's labs were unremarkable. Patient's imaging did reveal an L1-L2 herniated disc.  Patient does not have any other symptoms other than burning pain in her right groin and pain with walking.  She has no signs of cauda equina syndrome.  She will be encouraged to continue steroids and I will prescribe pain medicine to take as needed.   Laurence Aly, MD   Note: This note was generated in part or whole with voice recognition software. Voice recognition is usually quite accurate but there are transcription errors that can and very often do occur. I apologize for any typographical errors that were not detected and corrected.     Earleen Newport, MD 06/07/18 1250

## 2018-06-07 NOTE — ED Triage Notes (Signed)
Patient ambulatory to triage with steady gait, without difficulty or distress noted; pt reports right flank pain radiating into right lower abd/groin; seen Sunday for same with no known findings

## 2018-06-07 NOTE — ED Notes (Signed)
Pt being transported to MRI at this time.

## 2018-06-07 NOTE — ED Notes (Signed)
Pt up to toilet. Urine sample sent to lab

## 2018-06-14 ENCOUNTER — Ambulatory Visit: Payer: Self-pay | Admitting: Family Medicine

## 2018-06-14 ENCOUNTER — Ambulatory Visit: Payer: Medicare HMO | Admitting: Family Medicine

## 2018-06-14 DIAGNOSIS — M5416 Radiculopathy, lumbar region: Secondary | ICD-10-CM | POA: Diagnosis not present

## 2018-06-14 DIAGNOSIS — M545 Low back pain: Secondary | ICD-10-CM | POA: Diagnosis not present

## 2018-06-15 ENCOUNTER — Ambulatory Visit: Payer: Medicare HMO | Admitting: *Deleted

## 2018-06-16 ENCOUNTER — Other Ambulatory Visit: Payer: Self-pay | Admitting: Student

## 2018-06-16 ENCOUNTER — Ambulatory Visit (INDEPENDENT_AMBULATORY_CARE_PROVIDER_SITE_OTHER): Payer: Medicare HMO | Admitting: Family Medicine

## 2018-06-16 ENCOUNTER — Encounter: Payer: Self-pay | Admitting: Family Medicine

## 2018-06-16 VITALS — BP 122/72 | HR 78 | Temp 98.7°F | Ht 66.25 in | Wt 178.2 lb

## 2018-06-16 DIAGNOSIS — G40209 Localization-related (focal) (partial) symptomatic epilepsy and epileptic syndromes with complex partial seizures, not intractable, without status epilepticus: Secondary | ICD-10-CM | POA: Diagnosis not present

## 2018-06-16 DIAGNOSIS — R030 Elevated blood-pressure reading, without diagnosis of hypertension: Secondary | ICD-10-CM | POA: Diagnosis not present

## 2018-06-16 DIAGNOSIS — M170 Bilateral primary osteoarthritis of knee: Secondary | ICD-10-CM | POA: Diagnosis not present

## 2018-06-16 DIAGNOSIS — M5416 Radiculopathy, lumbar region: Secondary | ICD-10-CM

## 2018-06-16 DIAGNOSIS — E2839 Other primary ovarian failure: Secondary | ICD-10-CM | POA: Diagnosis not present

## 2018-06-16 DIAGNOSIS — R69 Illness, unspecified: Secondary | ICD-10-CM | POA: Diagnosis not present

## 2018-06-16 DIAGNOSIS — D509 Iron deficiency anemia, unspecified: Secondary | ICD-10-CM | POA: Diagnosis not present

## 2018-06-16 DIAGNOSIS — F419 Anxiety disorder, unspecified: Secondary | ICD-10-CM

## 2018-06-16 DIAGNOSIS — E785 Hyperlipidemia, unspecified: Secondary | ICD-10-CM

## 2018-06-16 DIAGNOSIS — Z0000 Encounter for general adult medical examination without abnormal findings: Secondary | ICD-10-CM | POA: Diagnosis not present

## 2018-06-16 DIAGNOSIS — M8580 Other specified disorders of bone density and structure, unspecified site: Secondary | ICD-10-CM | POA: Diagnosis not present

## 2018-06-16 DIAGNOSIS — Z79899 Other long term (current) drug therapy: Secondary | ICD-10-CM | POA: Diagnosis not present

## 2018-06-16 LAB — COMPREHENSIVE METABOLIC PANEL
ALK PHOS: 49 U/L (ref 39–117)
ALT: 26 U/L (ref 0–35)
AST: 18 U/L (ref 0–37)
Albumin: 4.3 g/dL (ref 3.5–5.2)
BUN: 25 mg/dL — ABNORMAL HIGH (ref 6–23)
CALCIUM: 10 mg/dL (ref 8.4–10.5)
CO2: 30 meq/L (ref 19–32)
Chloride: 103 mEq/L (ref 96–112)
Creatinine, Ser: 0.79 mg/dL (ref 0.40–1.20)
GFR: 76.84 mL/min (ref >60.00)
GLUCOSE: 95 mg/dL (ref 70–99)
POTASSIUM: 4.4 meq/L (ref 3.5–5.1)
Sodium: 141 mEq/L (ref 135–145)
TOTAL PROTEIN: 7.1 g/dL (ref 6.0–8.3)
Total Bilirubin: 0.3 mg/dL (ref 0.2–1.2)

## 2018-06-16 LAB — CBC WITH DIFFERENTIAL/PLATELET
Basophils Absolute: 0.1 10*3/uL (ref 0.0–0.1)
Basophils Relative: 0.6 % (ref 0.0–3.0)
EOS PCT: 2.5 % (ref 0.0–5.0)
Eosinophils Absolute: 0.2 10*3/uL (ref 0.0–0.7)
HCT: 41.8 % (ref 36.0–46.0)
Hemoglobin: 13.7 g/dL (ref 12.0–15.0)
LYMPHS ABS: 2.2 10*3/uL (ref 0.7–4.0)
Lymphocytes Relative: 24.5 % (ref 12.0–46.0)
MCHC: 32.8 g/dL (ref 30.0–36.0)
MCV: 91.8 fl (ref 78.0–100.0)
MONOS PCT: 8.3 % (ref 3.0–12.0)
Monocytes Absolute: 0.8 10*3/uL (ref 0.1–1.0)
NEUTROS ABS: 5.8 10*3/uL (ref 1.4–7.7)
NEUTROS PCT: 64.1 % (ref 43.0–77.0)
PLATELETS: 351 10*3/uL (ref 150.0–400.0)
RBC: 4.56 Mil/uL (ref 3.87–5.11)
RDW: 15.4 % (ref 11.5–15.5)
WBC: 9.1 10*3/uL (ref 4.0–10.5)

## 2018-06-16 LAB — LIPID PANEL
CHOL/HDL RATIO: 3
Cholesterol: 201 mg/dL — ABNORMAL HIGH (ref 0–200)
HDL: 75.4 mg/dL (ref >39.00)
LDL Cholesterol: 108 mg/dL — ABNORMAL HIGH (ref 0–99)
NonHDL: 125.33
Triglycerides: 87 mg/dL (ref 0.0–149.0)
VLDL: 17.4 mg/dL (ref 0.0–40.0)

## 2018-06-16 MED ORDER — ZOSTER VAC RECOMB ADJUVANTED 50 MCG/0.5ML IM SUSR: 0.5000 mL | Freq: Once | INTRAMUSCULAR | 1 refills | Status: AC

## 2018-06-16 NOTE — Assessment & Plan Note (Signed)
On lamictal. Has follow up scheduled with neurology next month.

## 2018-06-16 NOTE — Assessment & Plan Note (Signed)
Followed by PMR/neurosurgery.  Scheduled for PT next week and epidural injections on 06/28/18.

## 2018-06-16 NOTE — Patient Instructions (Signed)
Great to see you. I will call you with your lab results from today and you can view them online.    I have sent the shingrix vaccine prescription to your pharmacy.

## 2018-06-16 NOTE — Assessment & Plan Note (Signed)
Reviewed preventive care protocols, scheduled due services, and updated immunizations Discussed nutrition, exercise, diet, and healthy lifestyle.  

## 2018-06-16 NOTE — Addendum Note (Signed)
Addended by: Lynnea Ferrier on: 06/16/2018 10:49 AM   Modules accepted: Orders

## 2018-06-16 NOTE — Progress Notes (Signed)
Subjective:   Patient ID: Lisa Yu, female    DOB: 05/20/1951, 67 y.o.   MRN: [id number]  Lisa Yu is a pleasant 67 y.o. year old female who presents to clinic today with Annual Exam (Patient is here today for AWV.  She is not currently fasting.  She is seeing Neurology and will be getting injections and PT on 12.3.19 after hospital visit.  While in hospital labs were completed : Sed Rate/CMP/CBC/U/A/Lipase.  Future orders in system are for : Lamictal/Vit-D/Lipid/CBC/CMP/TSH.  Last does of Lamictal was 15 hours ago. Mammogram/BMD/Colonoscopy are UTD.)  on 06/16/2018  HPI:  Health Maintenance  Topic Date Due  . TETANUS/TDAP  10/28/2018 (Originally 10/28/2012)  . MAMMOGRAM  11/30/2019  . COLONOSCOPY  01/06/2021  . INFLUENZA VACCINE  Completed  . DEXA SCAN  Completed  . Hepatitis C Screening  Completed  . PNA vac Low Risk Adult  Completed   Depression screen [facility] 2/9 06/16/2018 03/17/2017 01/04/2017  Decreased Interest 0 0 0  Down, Depressed, Hopeless 0 0 3  PHQ - 2 Score 0 0 3  Altered sleeping - 1 3  Tired, decreased energy - 1 3  Change in appetite - 0 0  Feeling bad or failure about yourself  - 0 0  Trouble concentrating - 0 0  Moving slowly or fidgety/restless - 0 0  Suicidal thoughts - 0 0  PHQ-9 Score - 2 9  Difficult doing work/chores - Not difficult at all -   No post menopausal bleeding.  Seizure disorder- on Lamictal. Followed by neurology.  Last saw Evlyn Courier, NP on 05/24/17.  She has appointment scheduled with neuro on 07/08/18.  Lumbar radiculopathy- was seen by PMR on 06/04/18- note reviewed.  Referred for PT and will be receiving epidural injections on 06/28/18. Mr Lumbar Spine Wo Contrast  Result Date: 06/07/2018 CLINICAL DATA:  Back pain for greater than 6 weeks. Right flank pain. EXAM: MRI LUMBAR SPINE WITHOUT CONTRAST TECHNIQUE: Multiplanar, multisequence MR imaging of the lumbar spine was performed. No intravenous contrast was administered.  COMPARISON:  03/23/2017 FINDINGS: Segmentation:  Standard. Alignment:  Physiologic. Vertebrae:  No fracture, evidence of discitis, or bone lesion. Conus medullaris and cauda equina: Conus extends to the T12 level. Conus and cauda equina appear normal. Paraspinal and other soft tissues: No acute paraspinal abnormality. Disc levels: Disc spaces: Degenerative disease with disc height loss at L4-5. T12-L1: No significant disc bulge. No evidence of neural foraminal stenosis. No central canal stenosis. L1-L2: Right paracentral/foraminal disc extrusion with cephalad migration of disc material with mass effect on the right intraspinal L1 nerve root. Mild right foraminal stenosis. Mild bilateral facet arthropathy. No spinal stenosis. L2-L3: Broad-based disc bulge flattening the ventral thecal sac. Bilateral lateral recess narrowing. Mild bilateral facet arthropathy. No evidence of neural foraminal stenosis. No central canal stenosis. L3-L4: Mild broad-based disc bulge. Moderate bilateral facet arthropathy. No evidence of neural foraminal stenosis. No central canal stenosis. L4-L5: Broad-based disc bulge. No evidence of neural foraminal stenosis. No central canal stenosis. L5-S1: No significant disc bulge. No evidence of neural foraminal stenosis. No central canal stenosis. IMPRESSION: 1. At L1-2 there is a right paracentral/foraminal disc extrusion with cephalad migration of disc material with mass effect on the right intraspinal L1 nerve root. Mild right foraminal stenosis. Mild bilateral facet arthropathy. 2. At L2-3 there is a broad-based disc bulge flattening the ventral thecal sac. Bilateral lateral recess narrowing. Mild bilateral facet arthropathy. Electronically Signed   By: Kathreen Devoid  On: 06/07/2018 12:29   Ct Renal Stone Study  Result Date: 06/05/2018 CLINICAL DATA:  Patient with worsening right lower quadrant abdominal pain. EXAM: CT ABDOMEN AND PELVIS WITHOUT CONTRAST TECHNIQUE: Multidetector CT imaging  of the abdomen and pelvis was performed following the standard protocol without IV contrast. COMPARISON:  CT abdomen pelvis 04/08/2017 FINDINGS: Lower chest: Normal heart size. Lung bases are clear. No pleural effusion. Hepatobiliary: Liver is normal in size and contour. Gallbladder is unremarkable. No intrahepatic or extrahepatic biliary ductal dilatation. Pancreas: Unremarkable Spleen: Unremarkable Adrenals/Urinary Tract: Normal adrenal glands. Kidneys are symmetric in size. No hydronephrosis. Urinary bladder is unremarkable. Stomach/Bowel: Sigmoid colonic diverticulosis. No CT evidence for acute diverticulitis. Normal appendix. No evidence for small bowel obstruction. No free fluid or free intraperitoneal air. Normal morphology of the stomach. Vascular/Lymphatic: Normal caliber abdominal aorta. Peripheral calcified atherosclerotic plaque. No retroperitoneal lymphadenopathy. Reproductive: Uterus and adnexal structures unremarkable. Other: None. Musculoskeletal: Lumbar spine degenerative changes. No aggressive or acute appearing osseous lesions. IMPRESSION: No acute process within the abdomen or pelvis. Sigmoid colonic diverticulosis without evidence for acute diverticulitis. Electronically Signed   By: Lovey Newcomer M.D.   On: 06/05/2018 15:11    Current Outpatient Medications on File Prior to Visit  Medication Sig Dispense Refill  . aspirin 81 MG tablet Take 81 mg by mouth daily.      . Calcium Carbonate-Vitamin D (TH CALCIUM CARBONATE-VITAMIN D) 600-400 MG-UNIT per tablet Take 1 tablet by mouth.     . chlorhexidine (PERIDEX) 0.12 % solution     . fluticasone (FLONASE) 50 MCG/ACT nasal spray Place 2 sprays into both nostrils daily as needed. Reported on 12/27/2015 16 g prn  . HYDROmorphone (DILAUDID) 2 MG tablet Take 1 tablet (2 mg total) by mouth every 12 (twelve) hours as needed for severe pain. 20 tablet 0  . hyoscyamine (LEVSIN) 0.125 MG tablet Take 1 tablet (0.125 mg total) by mouth every 4 (four)  hours as needed. 90 tablet 3  . lamoTRIgine (LAMICTAL) 200 MG tablet Take 1 tablet (200 mg total) by mouth 2 (two) times daily. 180 tablet 3  . meloxicam (MOBIC) 15 MG tablet     . Multiple Vitamin (MULTIVITAMIN) tablet Take 1 tablet by mouth daily.       No current facility-administered medications on file prior to visit.     Allergies  Allergen Reactions  . Other Itching and Rash    Shellfish containing products   . Penicillins     REACTION: rash    Past Medical History:  Diagnosis Date  . Diverticulosis   . Osteopenia 12/21/2007  . Seizures ([place])    X 1 IN 2009    Past Surgical History:  Procedure Laterality Date  . AUGMENTATION MAMMAPLASTY Bilateral 1998   removed 2013  . BREAST SURGERY  1983   bilateral implants  . cervical disease  1999   H/O Cryo  . CHONDROPLASTY Right 05/13/2016   Procedure: CHONDROPLASTY;  Surgeon: Dereck Leep, MD;  Location: [facility] ORS;  Service: Orthopedics;  Laterality: Right;  . COLONOSCOPY  05/01/2005  . FOOT SURGERY Bilateral   . KNEE ARTHROSCOPY Right 05/13/2016   Procedure: ARTHROSCOPY KNEE WITH MEDIAL AND LATERAL MENISECTOMY;  Surgeon: Dereck Leep, MD;  Location: [facility] ORS;  Service: Orthopedics;  Laterality: Right;  . LASIK  2007  . MOUTH SURGERY     DENTAL IMPLANTS (TOP)  . VAGINAL DELIVERY     x 1    Family History  Problem Relation Age of Onset  .  Cancer Mother        skin  . Breast cancer Neg Hx     Social History   Socioeconomic History  . Marital status: Married    Spouse name: Ernie Hew  . Number of children: 1  . Years of education: 68 th  . Highest education level: Not on file  Occupational History  . Occupation: Retired from [employer]  . Financial resource strain: Not on file  . Food insecurity:    Worry: Not on file    Inability: Not on file  . Transportation needs:    Medical: Not on file    Non-medical: Not on file  Tobacco Use  . Smoking status: Never Smoker  . Smokeless tobacco:  Never Used  Substance and Sexual Activity  . Alcohol use: No    Alcohol/week: 0.0 standard drinks  . Drug use: No  . Sexual activity: Not on file  Lifestyle  . Physical activity:    Days per week: Not on file    Minutes per session: Not on file  . Stress: Not on file  Relationships  . Social connections:    Talks on phone: Not on file    Gets together: Not on file    Attends religious service: Not on file    Active member of club or organization: Not on file    Attends meetings of clubs or organizations: Not on file    Relationship status: Not on file  . Intimate partner violence:    Fear of current or ex partner: Not on file    Emotionally abused: Not on file    Physically abused: Not on file    Forced sexual activity: Not on file  Other Topics Concern  . Not on file  Social History Narrative   Patient lives at home with her husband Ernie Hew).   Retired and she is self employed.   Education 12 th    Right handed.   Caffeine two cups of coffee one soda daily.      Would desire CPR, no prolonged life support if futile.   The PMH, PSH, Social History, Family History, Medications, and allergies have been reviewed in [facility], and have been updated if relevant.   Review of Systems  Constitutional: Negative.   HENT: Negative.   Eyes: Negative.   Respiratory: Negative.   Cardiovascular: Negative.   Gastrointestinal: Negative.   Endocrine: Negative.   Genitourinary: Negative.   Musculoskeletal: Positive for back pain.  Allergic/Immunologic: Negative.   Neurological: Positive for numbness.  Psychiatric/Behavioral: Negative.   All other systems reviewed and are negative.      Objective:    BP 122/72 (BP Location: Left Arm, Patient Position: Sitting, Cuff Size: Normal)   Pulse 78   Temp 98.7 F (37.1 C) (Oral)   Ht 5' 6.25" (1.683 m)   Wt 178 lb 3.2 oz (80.8 kg)   SpO2 97%   BMI 28.55 kg/m    Physical Exam    General:  Well-developed,well-nourished,in no acute  distress; alert,appropriate and cooperative throughout examination Head:  normocephalic and atraumatic.   Eyes:  vision grossly intact, PERRL Ears:  R ear normal and L ear normal externally, TMs clear bilaterally Nose:  no external deformity.   Mouth:  good dentition.   Neck:  No deformities, masses, or tenderness noted. Breasts:  No mass, nodules, thickening, tenderness, bulging, retraction, inflamation, nipple discharge or skin changes noted.   Lungs:  Normal respiratory effort, chest expands symmetrically. Lungs  are clear to auscultation, no crackles or wheezes. Heart:  Normal rate and regular rhythm. S1 and S2 normal without gallop, murmur, click, rub or other extra sounds. Abdomen:  Bowel sounds positive,abdomen soft and non-tender without masses, organomegaly or hernias noted. Msk:  No deformity or scoliosis noted of thoracic or lumbar spine.   Extremities:  No clubbing, cyanosis, edema, or deformity noted with normal full range of motion of all joints.   Neurologic:  alert & oriented X3 and gait normal.   Skin:  Intact without suspicious lesions or rashes Cervical Nodes:  No lymphadenopathy noted Axillary Nodes:  No palpable lymphadenopathy Psych:  Cognition and judgment appear intact. Alert and cooperative with normal attention span and concentration. No apparent delusions, illusions, hallucinations     Assessment & Plan:   Well woman exam without gynecological exam  Partial epilepsy with impairment of consciousness ([place]), Chronic No follow-ups on file.

## 2018-06-17 LAB — TSH: TSH: 1.78 u[IU]/mL (ref 0.35–4.50)

## 2018-06-17 LAB — VITAMIN D 25 HYDROXY (VIT D DEFICIENCY, FRACTURES): VITD: 20.37 ng/mL — ABNORMAL LOW (ref 30.00–100.00)

## 2018-06-18 LAB — LAMOTRIGINE LEVEL: Lamotrigine Lvl: 6.8 ug/mL (ref 4.0–18.0)

## 2018-06-20 ENCOUNTER — Other Ambulatory Visit: Payer: Self-pay | Admitting: Family Medicine

## 2018-06-20 DIAGNOSIS — E559 Vitamin D deficiency, unspecified: Secondary | ICD-10-CM

## 2018-06-20 MED ORDER — VITAMIN D (ERGOCALCIFEROL) 1.25 MG (50000 UNIT) PO CAPS: 50000.0000 [IU] | ORAL_CAPSULE | ORAL | 0 refills | Status: DC

## 2018-06-21 ENCOUNTER — Ambulatory Visit: Payer: Medicare HMO | Admitting: Nurse Practitioner

## 2018-06-21 DIAGNOSIS — J209 Acute bronchitis, unspecified: Secondary | ICD-10-CM | POA: Diagnosis not present

## 2018-06-21 DIAGNOSIS — R05 Cough: Secondary | ICD-10-CM | POA: Diagnosis not present

## 2018-06-21 DIAGNOSIS — R51 Headache: Secondary | ICD-10-CM | POA: Diagnosis not present

## 2018-06-28 ENCOUNTER — Other Ambulatory Visit: Payer: Medicare HMO

## 2018-07-06 DIAGNOSIS — M5126 Other intervertebral disc displacement, lumbar region: Secondary | ICD-10-CM | POA: Diagnosis not present

## 2018-07-06 DIAGNOSIS — M5416 Radiculopathy, lumbar region: Secondary | ICD-10-CM | POA: Diagnosis not present

## 2018-07-26 DIAGNOSIS — M25561 Pain in right knee: Secondary | ICD-10-CM | POA: Diagnosis not present

## 2018-07-26 DIAGNOSIS — M1711 Unilateral primary osteoarthritis, right knee: Secondary | ICD-10-CM | POA: Diagnosis not present

## 2018-07-26 DIAGNOSIS — M25562 Pain in left knee: Secondary | ICD-10-CM | POA: Diagnosis not present

## 2018-07-26 DIAGNOSIS — M25361 Other instability, right knee: Secondary | ICD-10-CM | POA: Diagnosis not present

## 2018-07-28 ENCOUNTER — Other Ambulatory Visit: Payer: Self-pay | Admitting: Physician Assistant

## 2018-07-28 DIAGNOSIS — M25361 Other instability, right knee: Secondary | ICD-10-CM

## 2018-07-28 DIAGNOSIS — M25561 Pain in right knee: Secondary | ICD-10-CM

## 2018-08-08 DIAGNOSIS — M5126 Other intervertebral disc displacement, lumbar region: Secondary | ICD-10-CM | POA: Diagnosis not present

## 2018-08-08 DIAGNOSIS — M5416 Radiculopathy, lumbar region: Secondary | ICD-10-CM | POA: Diagnosis not present

## 2018-08-11 ENCOUNTER — Ambulatory Visit
Admission: RE | Admit: 2018-08-11 | Discharge: 2018-08-11 | Disposition: A | Discharge status: Home / Self Care | Payer: Medicare HMO | Source: Ambulatory Visit | Attending: Physician Assistant | Admitting: Physician Assistant

## 2018-08-11 DIAGNOSIS — M25361 Other instability, right knee: Secondary | ICD-10-CM | POA: Diagnosis not present

## 2018-08-11 DIAGNOSIS — M25561 Pain in right knee: Secondary | ICD-10-CM

## 2018-08-25 DIAGNOSIS — M545 Low back pain: Secondary | ICD-10-CM | POA: Diagnosis not present

## 2018-09-01 DIAGNOSIS — M1711 Unilateral primary osteoarthritis, right knee: Secondary | ICD-10-CM | POA: Diagnosis not present

## 2018-09-02 DIAGNOSIS — M545 Low back pain: Secondary | ICD-10-CM | POA: Diagnosis not present

## 2018-09-09 ENCOUNTER — Other Ambulatory Visit: Payer: Self-pay | Admitting: Family Medicine

## 2018-09-09 NOTE — Telephone Encounter (Signed)
Needs to repeat lab first/thx dmf

## 2018-09-12 DIAGNOSIS — J0100 Acute maxillary sinusitis, unspecified: Secondary | ICD-10-CM | POA: Diagnosis not present

## 2018-09-12 DIAGNOSIS — R05 Cough: Secondary | ICD-10-CM | POA: Diagnosis not present

## 2018-09-12 DIAGNOSIS — R509 Fever, unspecified: Secondary | ICD-10-CM | POA: Diagnosis not present

## 2018-09-19 ENCOUNTER — Other Ambulatory Visit: Payer: Medicare HMO

## 2018-09-19 DIAGNOSIS — M545 Low back pain: Secondary | ICD-10-CM | POA: Diagnosis not present

## 2018-09-20 DIAGNOSIS — M5416 Radiculopathy, lumbar region: Secondary | ICD-10-CM | POA: Diagnosis not present

## 2018-09-20 DIAGNOSIS — M545 Low back pain: Secondary | ICD-10-CM | POA: Diagnosis not present

## 2018-09-21 DIAGNOSIS — M545 Low back pain: Secondary | ICD-10-CM | POA: Diagnosis not present

## 2018-09-22 ENCOUNTER — Telehealth: Payer: Self-pay

## 2018-09-22 NOTE — Telephone Encounter (Signed)
Copied from [place] [id number]. Topic: Appointment Scheduling - Transfer of Care >> Sep 22, 2018  3:57 PM Reyne Dumas L wrote: Pt is requesting to transfer FROM: Dr. Deborra Medina Pt is requesting to transfer TO: any female provider Dr. Deborra Medina would recommend at [facility] Reason for requested transfer: closer to home  Pt can be reached at [phone]  Send CRM to patient's current PCP (transferring FROM).

## 2018-09-23 NOTE — Telephone Encounter (Signed)
TA-Pt would like for you to recom a PCP for her to have that is closer to her home/Plz advise/thx dmf

## 2018-09-28 DIAGNOSIS — M5126 Other intervertebral disc displacement, lumbar region: Secondary | ICD-10-CM | POA: Diagnosis not present

## 2018-09-28 DIAGNOSIS — M5136 Other intervertebral disc degeneration, lumbar region: Secondary | ICD-10-CM | POA: Diagnosis not present

## 2018-09-28 DIAGNOSIS — M5416 Radiculopathy, lumbar region: Secondary | ICD-10-CM | POA: Diagnosis not present

## 2018-09-30 DIAGNOSIS — E559 Vitamin D deficiency, unspecified: Secondary | ICD-10-CM | POA: Diagnosis not present

## 2018-09-30 DIAGNOSIS — R569 Unspecified convulsions: Secondary | ICD-10-CM | POA: Diagnosis not present

## 2018-09-30 DIAGNOSIS — R69 Illness, unspecified: Secondary | ICD-10-CM | POA: Diagnosis not present

## 2018-09-30 DIAGNOSIS — I10 Essential (primary) hypertension: Secondary | ICD-10-CM | POA: Diagnosis not present

## 2018-10-10 NOTE — Telephone Encounter (Signed)
Pt would like a call back for a referral to a new PCP

## 2018-10-11 ENCOUNTER — Telehealth: Payer: Self-pay

## 2018-10-11 NOTE — Telephone Encounter (Signed)
Lisa Yu is a requesting a call from you before she takes this pt/I will make them aware that you are out of the office until 3.23.20/thx dmf

## 2018-10-11 NOTE — Telephone Encounter (Signed)
Patient wanted to inform PCP she found a new doctor.Informed patient my apologies for the mis understanding.

## 2018-10-11 NOTE — Telephone Encounter (Signed)
I see the message. I never asked PCP to call me prior to transfer. If she wants to transfer please just have someone schedule a new pt appt with me. We do not screen our new patients for approval to transfer when their provider has left the office.

## 2018-10-11 NOTE — Telephone Encounter (Signed)
Copied from [place] [phone] [phone]. Topic: Referral - Request for Referral >> Oct 11, 2018  8:43 AM Alanda Slim E wrote: Has patient seen PCP for this complaint? Yes  *If NO, is insurance requiring patient see PCP for this issue before PCP can refer them? Referral for which specialty: PCP  Preferred provider/office: [facility] / Webb Silversmith Reason for referral: Webb Silversmith is requesting a call from Dr. Deborra Medina to take on Pt. Pt stated she needed a referral to see NP at [facility] / please advise

## 2018-10-11 NOTE — Telephone Encounter (Signed)
RB-Here is the message that we were discussing/let me know what you would like for me to do/thx dmf

## 2018-10-11 NOTE — Telephone Encounter (Signed)
PEC-Can you please schedule this pt as a Transfer of Care visit with Rollene Fare Baity/If you cannot can you please get this to someone who can do so? Thx dmf

## 2018-10-11 NOTE — Telephone Encounter (Signed)
We do not typically need to place referral for patient's to transfer.  She was my patient at [facility] and it is likely a far drive for her to [place].  Can you please find out what the issue is or if the protocol has changed?

## 2018-10-12 ENCOUNTER — Other Ambulatory Visit: Payer: Self-pay

## 2018-10-12 MED ORDER — VITAMIN D (ERGOCALCIFEROL) 1.25 MG (50000 UNIT) PO CAPS: 50000.0000 [IU] | ORAL_CAPSULE | ORAL | 0 refills | Status: DC

## 2018-10-12 NOTE — Telephone Encounter (Signed)
I spoke with patient. She has an appt set up to establish care with Dr Sabra Heck, I updated her chartt

## 2018-10-17 DIAGNOSIS — E782 Mixed hyperlipidemia: Secondary | ICD-10-CM | POA: Insufficient documentation

## 2018-10-17 DIAGNOSIS — R7989 Other specified abnormal findings of blood chemistry: Secondary | ICD-10-CM | POA: Insufficient documentation

## 2018-10-17 DIAGNOSIS — M5416 Radiculopathy, lumbar region: Secondary | ICD-10-CM | POA: Diagnosis not present

## 2018-10-17 DIAGNOSIS — R69 Illness, unspecified: Secondary | ICD-10-CM | POA: Diagnosis not present

## 2018-10-17 DIAGNOSIS — E538 Deficiency of other specified B group vitamins: Secondary | ICD-10-CM | POA: Diagnosis not present

## 2018-11-03 DIAGNOSIS — Z0000 Encounter for general adult medical examination without abnormal findings: Secondary | ICD-10-CM | POA: Diagnosis not present

## 2018-12-27 ENCOUNTER — Other Ambulatory Visit: Payer: Self-pay | Admitting: Family Medicine

## 2019-01-09 DIAGNOSIS — R69 Illness, unspecified: Secondary | ICD-10-CM | POA: Diagnosis not present

## 2019-01-31 DIAGNOSIS — R569 Unspecified convulsions: Secondary | ICD-10-CM | POA: Diagnosis not present

## 2019-01-31 DIAGNOSIS — R69 Illness, unspecified: Secondary | ICD-10-CM | POA: Diagnosis not present

## 2019-02-16 DIAGNOSIS — M1711 Unilateral primary osteoarthritis, right knee: Secondary | ICD-10-CM | POA: Diagnosis not present

## 2019-03-31 DIAGNOSIS — R69 Illness, unspecified: Secondary | ICD-10-CM | POA: Diagnosis not present

## 2019-04-04 DIAGNOSIS — M5416 Radiculopathy, lumbar region: Secondary | ICD-10-CM | POA: Diagnosis not present

## 2019-04-04 DIAGNOSIS — M5126 Other intervertebral disc displacement, lumbar region: Secondary | ICD-10-CM | POA: Diagnosis not present

## 2019-04-09 NOTE — Discharge Instructions (Signed)
°  Instructions after Total Knee Replacement ° ° Abimelec Grochowski P. Lachele Lievanos, Jr., M.D.    ° Dept. of Orthopaedics & Sports Medicine ° [facility] ° [address] ° [place], [place]  [address] ° Phone: [phone]   Fax: [phone] ° °  °DIET: °• Drink plenty of non-alcoholic fluids. °• Resume your normal diet. Include foods high in fiber. ° °ACTIVITY:  °• You may use crutches or a walker with weight-bearing as tolerated, unless instructed otherwise. °• You may be weaned off of the walker or crutches by your Physical Therapist.  °• Do NOT place pillows under the knee. Anything placed under the knee could limit your ability to straighten the knee.   °• Continue doing gentle exercises. Exercising will reduce the pain and swelling, increase motion, and prevent muscle weakness.   °• Please continue to use the TED compression stockings for 6 weeks. You may remove the stockings at night, but should reapply them in the morning. °• Do not drive or operate any equipment until instructed. ° °WOUND CARE:  °• Continue to use the PolarCare or ice packs periodically to reduce pain and swelling. °• You may bathe or shower after the staples are removed at the first office visit following surgery. ° °MEDICATIONS: °• You may resume your regular medications. °• Please take the pain medication as prescribed on the medication. °• Do not take pain medication on an empty stomach. °• You have been given a prescription for a blood thinner (Lovenox or Coumadin). Please take the medication as instructed. (NOTE: After completing a 2 week course of Lovenox, take one Enteric-coated aspirin once a day. This along with elevation will help reduce the possibility of phlebitis in your operated leg.) °• Do not drive or drink alcoholic beverages when taking pain medications. ° °CALL THE OFFICE FOR: °• Temperature above 101 degrees °• Excessive bleeding or drainage on the dressing. °• Excessive swelling, coldness, or paleness of the toes. °• Persistent  nausea and vomiting. ° °FOLLOW-UP:  °• You should have an appointment to return to the office in 10-14 days after surgery. °• Arrangements have been made for continuation of Physical Therapy (either home therapy or outpatient therapy). °  °

## 2019-04-12 ENCOUNTER — Other Ambulatory Visit: Payer: Self-pay

## 2019-04-12 ENCOUNTER — Encounter
Admission: RE | Admit: 2019-04-12 | Discharge: 2019-04-12 | Disposition: A | Discharge status: Home / Self Care | Payer: Medicare HMO | Source: Ambulatory Visit | Attending: Orthopedic Surgery | Admitting: Orthopedic Surgery

## 2019-04-12 DIAGNOSIS — Z01818 Encounter for other preprocedural examination: Secondary | ICD-10-CM | POA: Diagnosis present

## 2019-04-12 DIAGNOSIS — Z01810 Encounter for preprocedural cardiovascular examination: Secondary | ICD-10-CM | POA: Diagnosis not present

## 2019-04-12 DIAGNOSIS — Z87891 Personal history of nicotine dependence: Secondary | ICD-10-CM | POA: Insufficient documentation

## 2019-04-12 DIAGNOSIS — Z7982 Long term (current) use of aspirin: Secondary | ICD-10-CM | POA: Diagnosis not present

## 2019-04-12 DIAGNOSIS — Z0184 Encounter for antibody response examination: Secondary | ICD-10-CM | POA: Diagnosis not present

## 2019-04-12 DIAGNOSIS — M1711 Unilateral primary osteoarthritis, right knee: Secondary | ICD-10-CM | POA: Insufficient documentation

## 2019-04-12 DIAGNOSIS — Z79899 Other long term (current) drug therapy: Secondary | ICD-10-CM | POA: Insufficient documentation

## 2019-04-12 DIAGNOSIS — Z01812 Encounter for preprocedural laboratory examination: Secondary | ICD-10-CM | POA: Diagnosis not present

## 2019-04-12 HISTORY — DX: Unspecified osteoarthritis, unspecified site: M19.90

## 2019-04-12 HISTORY — DX: Dorsalgia, unspecified: M54.9

## 2019-04-12 HISTORY — DX: Anxiety disorder, unspecified: F41.9

## 2019-04-12 HISTORY — DX: Irritable bowel syndrome, unspecified: K58.9

## 2019-04-12 LAB — URINALYSIS, ROUTINE W REFLEX MICROSCOPIC
Bacteria, UA: NONE SEEN
Bilirubin Urine: NEGATIVE
Glucose, UA: NEGATIVE mg/dL
Hgb urine dipstick: NEGATIVE
Ketones, ur: NEGATIVE mg/dL
Nitrite: NEGATIVE
Protein, ur: NEGATIVE mg/dL
Specific Gravity, Urine: 1.023 (ref 1.005–1.030)
pH: 5 (ref 5.0–8.0)

## 2019-04-12 LAB — COMPREHENSIVE METABOLIC PANEL
ALT: 100 U/L — ABNORMAL HIGH (ref 0–44)
AST: 45 U/L — ABNORMAL HIGH (ref 15–41)
Albumin: 4.2 g/dL (ref 3.5–5.0)
Alkaline Phosphatase: 101 U/L (ref 38–126)
Anion gap: 8 (ref 5–15)
BUN: 26 mg/dL — ABNORMAL HIGH (ref 8–23)
CO2: 27 mmol/L (ref 22–32)
Calcium: 9.6 mg/dL (ref 8.9–10.3)
Chloride: 102 mmol/L (ref 98–111)
Creatinine, Ser: 0.62 mg/dL (ref 0.44–1.00)
GFR calc Af Amer: >60 mL/min (ref >60)
GFR calc non Af Amer: >60 mL/min (ref >60)
Glucose, Bld: 86 mg/dL (ref 70–99)
Potassium: 4.4 mmol/L (ref 3.5–5.1)
Sodium: 137 mmol/L (ref 135–145)
Total Bilirubin: 0.6 mg/dL (ref 0.3–1.2)
Total Protein: 7.5 g/dL (ref 6.5–8.1)

## 2019-04-12 LAB — CBC
HCT: 40.6 % (ref 36.0–46.0)
Hemoglobin: 13.2 g/dL (ref 12.0–15.0)
MCH: 29.6 pg (ref 26.0–34.0)
MCHC: 32.5 g/dL (ref 30.0–36.0)
MCV: 91 fL (ref 80.0–100.0)
Platelets: 312 10*3/uL (ref 150–400)
RBC: 4.46 MIL/uL (ref 3.87–5.11)
RDW: 14.6 % (ref 11.5–15.5)
WBC: 7 10*3/uL (ref 4.0–10.5)
nRBC: 0 % (ref 0.0–0.2)

## 2019-04-12 LAB — SEDIMENTATION RATE: Sed Rate: 14 mm/hr (ref 0–30)

## 2019-04-12 LAB — TYPE AND SCREEN
ABO/RH(D): A POS
Antibody Screen: NEGATIVE

## 2019-04-12 LAB — SURGICAL PCR SCREEN
MRSA, PCR: NEGATIVE
Staphylococcus aureus: NEGATIVE

## 2019-04-12 LAB — C-REACTIVE PROTEIN: CRP: <0.8 mg/dL (ref <1.0)

## 2019-04-12 LAB — PROTIME-INR
INR: 0.9 (ref 0.8–1.2)
Prothrombin Time: 12 seconds (ref 11.4–15.2)

## 2019-04-12 LAB — APTT: aPTT: 28 seconds (ref 24–36)

## 2019-04-12 MED ORDER — ENSURE PRE-SURGERY PO LIQD
296.0000 mL | Freq: Once | ORAL | Status: DC
  Filled 2019-04-12: qty 296

## 2019-04-12 NOTE — Pre-Procedure Instructions (Signed)
Carbohydrate drink and incentive spirometry given along with instructions. 

## 2019-04-12 NOTE — Patient Instructions (Addendum)
Your procedure is scheduled on: 04/26/2019 Wed Report to Same Day Surgery 2nd floor medical mall [facility] Entrance-take elevator on left to 2nd floor.  Check in with surgery information desk.) To find out your arrival time please call [phone] between 1PM - 3PM on 04/25/2019 Tues  Remember: Instructions that are not followed completely may result in serious medical risk, up to and including death, or upon the discretion of your surgeon and anesthesiologist your surgery may need to be rescheduled.    _x___ 1. Do not eat food after midnight the night before your procedure. You may drink clear liquids up to 2 hours before you are scheduled to arrive at the hospital for your procedure.  Do not drink clear liquids within 2 hours of your scheduled arrival to the hospital.  Clear liquids include  --Water or Apple juice without pulp  --Clear carbohydrate beverage such as ClearFast or Gatorade  --Black Coffee or Clear Tea (No milk, no creamers, do not add anything to                  the coffee or Tea Type 1 and type 2 diabetics should only drink water.   ____Ensure clear carbohydrate drink on the way to the hospital for bariatric patients  x__Ensure clear carbohydrate drink 3 hours before surgery.   No gum chewing or hard candies.     __x__ 2. No Alcohol for 24 hours before or after surgery.   __x__3. No Smoking or e-cigarettes for 24 prior to surgery.  Do not use any chewable tobacco products for at least 6 hour prior to surgery   ____  4. Bring all medications with you on the day of surgery if instructed.    __x__ 5. Notify your doctor if there is any change in your medical condition     (cold, fever, infections).    x___6. On the morning of surgery brush your teeth with toothpaste and water.  You may rinse your mouth with mouth wash if you wish.  Do not swallow any toothpaste or mouthwash.   Do not wear jewelry, make-up, hairpins, clips or nail polish.  Do not wear lotions,  powders, or perfumes. You may wear deodorant.  Do not shave 48 hours prior to surgery. Men may shave face and neck.  Do not bring valuables to the hospital.    [facility] is not responsible for any belongings or valuables.               Contacts, dentures or bridgework may not be worn into surgery.  Leave your suitcase in the car. After surgery it may be brought to your room.  For patients admitted to the hospital, discharge time is determined by your                       treatment team.  _  Patients discharged the day of surgery will not be allowed to drive home.  You will need someone to drive you home and stay with you the night of your procedure.    Please read over the following fact sheets that you were given:   [facility] Preparing for Surgery and or MRSA Information   _x___ Take anti-hypertensive listed below, cardiac, seizure, asthma,     anti-reflux and psychiatric medicines. These include:  1. hyoscyamine (LEVSIN) 0.125 MG tablet  2.lamoTRIgine (LAMICTAL) 200 MG tablet  3.  4.  5.  6.  ____Fleets enema or Magnesium Citrate as  directed.   _x___ Use CHG Soap or sage wipes as directed on instruction sheet   ____ Use inhalers on the day of surgery and bring to hospital day of surgery  ____ Stop Metformin and Janumet 2 days prior to surgery.    ____ Take 1/2 of usual insulin dose the night before surgery and none on the morning     surgery.   _x___ Follow recommendations from Cardiologist, Pulmonologist or PCP regarding          stopping Aspirin, Coumadin, Plavix ,Eliquis, Effient, or Pradaxa, and Pletal.  X____Stop Anti-inflammatories such as Advil, Aleve, Ibuprofen, Motrin, Naproxen, Naprosyn, Goodies powders or aspirin products. OK to take Tylenol and                          Celebrex.   _x___ Stop supplements until after surgery.  But may continue Vitamin D, Vitamin B,       and multivitamin.   ____ Bring C-Pap to the hospital.

## 2019-04-13 LAB — URINE CULTURE: Special Requests: NORMAL

## 2019-04-14 LAB — IGE: IgE (Immunoglobulin E), Serum: 23 IU/mL (ref 6–495)

## 2019-04-21 ENCOUNTER — Other Ambulatory Visit
Admission: RE | Admit: 2019-04-21 | Discharge: 2019-04-21 | Disposition: A | Discharge status: Home / Self Care | Payer: Medicare HMO | Source: Ambulatory Visit | Attending: Orthopedic Surgery | Admitting: Orthopedic Surgery

## 2019-04-21 DIAGNOSIS — Z01812 Encounter for preprocedural laboratory examination: Secondary | ICD-10-CM | POA: Insufficient documentation

## 2019-04-21 DIAGNOSIS — Z20828 Contact with and (suspected) exposure to other viral communicable diseases: Secondary | ICD-10-CM | POA: Diagnosis not present

## 2019-04-22 LAB — SARS CORONAVIRUS 2 (TAT 6-24 HRS): SARS Coronavirus 2: NEGATIVE

## 2019-04-25 MED ORDER — TRANEXAMIC ACID-NACL 1000-0.7 MG/100ML-% IV SOLN
1000.0000 mg | INTRAVENOUS | Status: AC
  Administered 2019-04-26: 12:00:00 1000 mg via INTRAVENOUS
  Filled 2019-04-25: qty 100

## 2019-04-25 MED ORDER — CLINDAMYCIN PHOSPHATE 900 MG/50ML IV SOLN
900.0000 mg | INTRAVENOUS | Status: AC
  Administered 2019-04-26: 900 mg via INTRAVENOUS

## 2019-04-26 ENCOUNTER — Inpatient Hospital Stay: Payer: Medicare HMO | Admitting: Anesthesiology

## 2019-04-26 ENCOUNTER — Encounter
Admission: RE | Disposition: A | Discharge status: Home / Self Care | Payer: Self-pay | Source: Home / Self Care | Attending: Orthopedic Surgery

## 2019-04-26 ENCOUNTER — Other Ambulatory Visit: Payer: Self-pay

## 2019-04-26 ENCOUNTER — Encounter: Payer: Self-pay | Admitting: Orthopedic Surgery

## 2019-04-26 ENCOUNTER — Inpatient Hospital Stay: Payer: Medicare HMO

## 2019-04-26 ENCOUNTER — Inpatient Hospital Stay
Admission: RE | Admit: 2019-04-26 | Discharge: 2019-04-28 | DRG: 470 | Disposition: A | Discharge status: Home / Self Care | Payer: Medicare HMO | Attending: Orthopedic Surgery | Admitting: Orthopedic Surgery

## 2019-04-26 DIAGNOSIS — Z7982 Long term (current) use of aspirin: Secondary | ICD-10-CM

## 2019-04-26 DIAGNOSIS — Z811 Family history of alcohol abuse and dependence: Secondary | ICD-10-CM

## 2019-04-26 DIAGNOSIS — Z79899 Other long term (current) drug therapy: Secondary | ICD-10-CM | POA: Diagnosis not present

## 2019-04-26 DIAGNOSIS — R69 Illness, unspecified: Secondary | ICD-10-CM | POA: Diagnosis not present

## 2019-04-26 DIAGNOSIS — Z96651 Presence of right artificial knee joint: Secondary | ICD-10-CM

## 2019-04-26 DIAGNOSIS — Z791 Long term (current) use of non-steroidal anti-inflammatories (NSAID): Secondary | ICD-10-CM

## 2019-04-26 DIAGNOSIS — Z885 Allergy status to narcotic agent status: Secondary | ICD-10-CM | POA: Diagnosis not present

## 2019-04-26 DIAGNOSIS — Z96659 Presence of unspecified artificial knee joint: Secondary | ICD-10-CM

## 2019-04-26 DIAGNOSIS — F419 Anxiety disorder, unspecified: Secondary | ICD-10-CM | POA: Diagnosis present

## 2019-04-26 DIAGNOSIS — Z91013 Allergy to seafood: Secondary | ICD-10-CM

## 2019-04-26 DIAGNOSIS — Z880 Allergy status to penicillin: Secondary | ICD-10-CM | POA: Diagnosis not present

## 2019-04-26 DIAGNOSIS — M1711 Unilateral primary osteoarthritis, right knee: Secondary | ICD-10-CM | POA: Diagnosis not present

## 2019-04-26 DIAGNOSIS — Z471 Aftercare following joint replacement surgery: Secondary | ICD-10-CM | POA: Diagnosis not present

## 2019-04-26 HISTORY — PX: KNEE ARTHROPLASTY: SHX992

## 2019-04-26 LAB — ABO/RH: ABO/RH(D): A POS

## 2019-04-26 SURGERY — ARTHROPLASTY, KNEE, TOTAL, USING IMAGELESS COMPUTER-ASSISTED NAVIGATION
Anesthesia: Spinal | Site: Knee | Laterality: Right

## 2019-04-26 MED ORDER — GABAPENTIN 300 MG PO CAPS
300.0000 mg | ORAL_CAPSULE | Freq: Once | ORAL | Status: AC
  Administered 2019-04-26: 10:00:00 300 mg via ORAL

## 2019-04-26 MED ORDER — TRANEXAMIC ACID-NACL 1000-0.7 MG/100ML-% IV SOLN
1000.0000 mg | Freq: Once | INTRAVENOUS | Status: AC
  Administered 2019-04-26: 1000 mg via INTRAVENOUS
  Filled 2019-04-26: qty 100

## 2019-04-26 MED ORDER — ACETAMINOPHEN 10 MG/ML IV SOLN
1000.0000 mg | Freq: Four times a day (QID) | INTRAVENOUS | Status: AC
  Administered 2019-04-26 – 2019-04-27 (×4): 1000 mg via INTRAVENOUS
  Filled 2019-04-26 (×4): qty 100

## 2019-04-26 MED ORDER — BISACODYL 10 MG RE SUPP: 10.0000 mg | Freq: Every day | RECTAL | Status: DC | PRN

## 2019-04-26 MED ORDER — ACETAMINOPHEN 10 MG/ML IV SOLN
INTRAVENOUS | Status: DC | PRN
  Administered 2019-04-26: 1000 mg via INTRAVENOUS

## 2019-04-26 MED ORDER — LACTATED RINGERS IV SOLN
INTRAVENOUS | Status: DC
  Administered 2019-04-26 (×2): via INTRAVENOUS

## 2019-04-26 MED ORDER — METOCLOPRAMIDE HCL 10 MG PO TABS: 5.0000 mg | ORAL_TABLET | Freq: Three times a day (TID) | ORAL | Status: DC | PRN

## 2019-04-26 MED ORDER — MENTHOL 3 MG MT LOZG
1.0000 | LOZENGE | OROMUCOSAL | Status: DC | PRN
  Filled 2019-04-26: qty 9

## 2019-04-26 MED ORDER — NEOMYCIN-POLYMYXIN B GU 40-200000 IR SOLN
Status: DC | PRN
  Administered 2019-04-26: 14 mL

## 2019-04-26 MED ORDER — METOCLOPRAMIDE HCL 5 MG/ML IJ SOLN: 5.0000 mg | Freq: Three times a day (TID) | INTRAMUSCULAR | Status: DC | PRN

## 2019-04-26 MED ORDER — SENNOSIDES-DOCUSATE SODIUM 8.6-50 MG PO TABS
1.0000 | ORAL_TABLET | Freq: Two times a day (BID) | ORAL | Status: DC
  Administered 2019-04-26 – 2019-04-28 (×3): 1 via ORAL
  Filled 2019-04-26 (×3): qty 1

## 2019-04-26 MED ORDER — ONDANSETRON HCL 4 MG/2ML IJ SOLN: 4.0000 mg | Freq: Four times a day (QID) | INTRAMUSCULAR | Status: DC | PRN

## 2019-04-26 MED ORDER — PROPOFOL 10 MG/ML IV BOLUS
INTRAVENOUS | Status: DC | PRN
  Administered 2019-04-26: 50 mg via INTRAVENOUS

## 2019-04-26 MED ORDER — POLYVINYL ALCOHOL 1.4 % OP SOLN
1.0000 [drp] | OPHTHALMIC | Status: DC | PRN
  Administered 2019-04-26: 16:00:00 1 [drp] via OPHTHALMIC
  Filled 2019-04-26: qty 15

## 2019-04-26 MED ORDER — DEXAMETHASONE SODIUM PHOSPHATE 10 MG/ML IJ SOLN
INTRAMUSCULAR | Status: AC
  Administered 2019-04-26: 8 mg via INTRAVENOUS
  Filled 2019-04-26: qty 1

## 2019-04-26 MED ORDER — FAMOTIDINE 20 MG PO TABS
20.0000 mg | ORAL_TABLET | Freq: Once | ORAL | Status: AC
  Administered 2019-04-26: 10:00:00 20 mg via ORAL

## 2019-04-26 MED ORDER — PROPOFOL 500 MG/50ML IV EMUL
INTRAVENOUS | Status: DC | PRN
  Administered 2019-04-26: 140 ug/kg/min via INTRAVENOUS

## 2019-04-26 MED ORDER — OXYCODONE HCL 5 MG PO TABS
10.0000 mg | ORAL_TABLET | ORAL | Status: DC | PRN
  Administered 2019-04-27 – 2019-04-28 (×4): 10 mg via ORAL
  Filled 2019-04-26 (×4): qty 2

## 2019-04-26 MED ORDER — CLINDAMYCIN PHOSPHATE 900 MG/50ML IV SOLN
INTRAVENOUS | Status: AC
  Filled 2019-04-26: qty 50

## 2019-04-26 MED ORDER — ENOXAPARIN SODIUM 30 MG/0.3ML ~~LOC~~ SOLN
30.0000 mg | Freq: Two times a day (BID) | SUBCUTANEOUS | Status: DC
  Administered 2019-04-27 – 2019-04-28 (×3): 30 mg via SUBCUTANEOUS
  Filled 2019-04-26 (×3): qty 0.3

## 2019-04-26 MED ORDER — PHENOL 1.4 % MT LIQD
1.0000 | OROMUCOSAL | Status: DC | PRN
  Filled 2019-04-26: qty 177

## 2019-04-26 MED ORDER — PANTOPRAZOLE SODIUM 40 MG PO TBEC
40.0000 mg | DELAYED_RELEASE_TABLET | Freq: Two times a day (BID) | ORAL | Status: DC
  Administered 2019-04-26 – 2019-04-28 (×4): 40 mg via ORAL
  Filled 2019-04-26 (×4): qty 1

## 2019-04-26 MED ORDER — SODIUM CHLORIDE 0.9 % IV SOLN
INTRAVENOUS | Status: DC
  Administered 2019-04-26 – 2019-04-27 (×2): via INTRAVENOUS

## 2019-04-26 MED ORDER — FLEET ENEMA 7-19 GM/118ML RE ENEM: 1.0000 | ENEMA | Freq: Once | RECTAL | Status: DC | PRN

## 2019-04-26 MED ORDER — HYOSCYAMINE SULFATE 0.125 MG PO TBDP
0.1250 mg | ORAL_TABLET | ORAL | Status: DC | PRN
  Administered 2019-04-27: 0.125 mg via ORAL
  Filled 2019-04-26 (×2): qty 1

## 2019-04-26 MED ORDER — BUPIVACAINE HCL (PF) 0.25 % IJ SOLN
INTRAMUSCULAR | Status: DC | PRN
  Administered 2019-04-26: 60 mL

## 2019-04-26 MED ORDER — DEXAMETHASONE SODIUM PHOSPHATE 10 MG/ML IJ SOLN
8.0000 mg | Freq: Once | INTRAMUSCULAR | Status: AC
  Administered 2019-04-26: 10:00:00 8 mg via INTRAVENOUS

## 2019-04-26 MED ORDER — CHLORHEXIDINE GLUCONATE 4 % EX LIQD: 60.0000 mL | Freq: Once | CUTANEOUS | Status: DC

## 2019-04-26 MED ORDER — ACETAMINOPHEN 325 MG PO TABS
325.0000 mg | ORAL_TABLET | Freq: Four times a day (QID) | ORAL | Status: DC | PRN
  Administered 2019-04-28: 650 mg via ORAL
  Filled 2019-04-26: qty 2

## 2019-04-26 MED ORDER — PROPOFOL 500 MG/50ML IV EMUL
INTRAVENOUS | Status: AC
  Filled 2019-04-26: qty 50

## 2019-04-26 MED ORDER — OXYCODONE HCL 5 MG PO TABS
5.0000 mg | ORAL_TABLET | ORAL | Status: DC | PRN
  Administered 2019-04-26 – 2019-04-27 (×3): 5 mg via ORAL
  Filled 2019-04-26 (×3): qty 1

## 2019-04-26 MED ORDER — CELECOXIB 200 MG PO CAPS
400.0000 mg | ORAL_CAPSULE | Freq: Once | ORAL | Status: AC
  Administered 2019-04-26: 10:00:00 400 mg via ORAL

## 2019-04-26 MED ORDER — FENTANYL CITRATE (PF) 100 MCG/2ML IJ SOLN: 25.0000 ug | INTRAMUSCULAR | Status: DC | PRN

## 2019-04-26 MED ORDER — ACETAMINOPHEN 10 MG/ML IV SOLN
INTRAVENOUS | Status: AC
  Filled 2019-04-26: qty 100

## 2019-04-26 MED ORDER — ADULT MULTIVITAMIN W/MINERALS CH
1.0000 | ORAL_TABLET | Freq: Every day | ORAL | Status: DC
  Administered 2019-04-27 – 2019-04-28 (×2): 1 via ORAL
  Filled 2019-04-26 (×2): qty 1

## 2019-04-26 MED ORDER — MIDAZOLAM HCL 2 MG/2ML IJ SOLN
INTRAMUSCULAR | Status: AC
  Filled 2019-04-26: qty 2

## 2019-04-26 MED ORDER — LAMOTRIGINE 100 MG PO TABS
200.0000 mg | ORAL_TABLET | Freq: Two times a day (BID) | ORAL | Status: DC
  Administered 2019-04-26 – 2019-04-28 (×4): 200 mg via ORAL
  Filled 2019-04-26 (×2): qty 2
  Filled 2019-04-26: qty 8
  Filled 2019-04-26 (×2): qty 2

## 2019-04-26 MED ORDER — GABAPENTIN 300 MG PO CAPS
ORAL_CAPSULE | ORAL | Status: AC
  Administered 2019-04-26: 300 mg via ORAL
  Filled 2019-04-26: qty 1

## 2019-04-26 MED ORDER — FERROUS SULFATE 325 (65 FE) MG PO TABS
325.0000 mg | ORAL_TABLET | Freq: Two times a day (BID) | ORAL | Status: DC
  Administered 2019-04-27 – 2019-04-28 (×3): 325 mg via ORAL
  Filled 2019-04-26 (×3): qty 1

## 2019-04-26 MED ORDER — CELECOXIB 200 MG PO CAPS
200.0000 mg | ORAL_CAPSULE | Freq: Two times a day (BID) | ORAL | Status: DC
  Administered 2019-04-26 – 2019-04-28 (×4): 200 mg via ORAL
  Filled 2019-04-26 (×4): qty 1

## 2019-04-26 MED ORDER — ONDANSETRON HCL 4 MG PO TABS: 4.0000 mg | ORAL_TABLET | Freq: Four times a day (QID) | ORAL | Status: DC | PRN

## 2019-04-26 MED ORDER — PROPOFOL 10 MG/ML IV BOLUS
INTRAVENOUS | Status: AC
  Filled 2019-04-26: qty 40

## 2019-04-26 MED ORDER — ALUM & MAG HYDROXIDE-SIMETH 200-200-20 MG/5ML PO SUSP
30.0000 mL | ORAL | Status: DC | PRN
  Administered 2019-04-27: 30 mL via ORAL
  Filled 2019-04-26: qty 30

## 2019-04-26 MED ORDER — FAMOTIDINE 20 MG PO TABS
ORAL_TABLET | ORAL | Status: AC
  Administered 2019-04-26: 10:00:00 20 mg via ORAL
  Filled 2019-04-26: qty 1

## 2019-04-26 MED ORDER — SODIUM CHLORIDE 0.9 % IV SOLN
INTRAVENOUS | Status: DC | PRN
  Administered 2019-04-26: 25 ug/min via INTRAVENOUS

## 2019-04-26 MED ORDER — METOCLOPRAMIDE HCL 10 MG PO TABS
10.0000 mg | ORAL_TABLET | Freq: Three times a day (TID) | ORAL | Status: DC
  Administered 2019-04-26 – 2019-04-28 (×7): 10 mg via ORAL
  Filled 2019-04-26 (×7): qty 1

## 2019-04-26 MED ORDER — HYDROMORPHONE HCL 1 MG/ML IJ SOLN
0.5000 mg | INTRAMUSCULAR | Status: DC | PRN
  Administered 2019-04-26: 0.5 mg via INTRAVENOUS
  Filled 2019-04-26: qty 1

## 2019-04-26 MED ORDER — GABAPENTIN 300 MG PO CAPS
300.0000 mg | ORAL_CAPSULE | Freq: Every day | ORAL | Status: DC
  Administered 2019-04-26 – 2019-04-27 (×2): 300 mg via ORAL
  Filled 2019-04-26 (×2): qty 1

## 2019-04-26 MED ORDER — CLONAZEPAM 0.5 MG PO TABS
0.5000 mg | ORAL_TABLET | Freq: Every day | ORAL | Status: DC
  Administered 2019-04-26 – 2019-04-27 (×2): 0.5 mg via ORAL
  Filled 2019-04-26 (×2): qty 1

## 2019-04-26 MED ORDER — SODIUM CHLORIDE 0.9 % IV SOLN
INTRAVENOUS | Status: DC | PRN
  Administered 2019-04-26: 60 mL

## 2019-04-26 MED ORDER — DIPHENHYDRAMINE HCL 12.5 MG/5ML PO ELIX: 12.5000 mg | ORAL_SOLUTION | ORAL | Status: DC | PRN

## 2019-04-26 MED ORDER — MIDAZOLAM HCL 5 MG/5ML IJ SOLN
INTRAMUSCULAR | Status: DC | PRN
  Administered 2019-04-26: 2 mg via INTRAVENOUS

## 2019-04-26 MED ORDER — MAGNESIUM HYDROXIDE 400 MG/5ML PO SUSP
30.0000 mL | Freq: Every day | ORAL | Status: DC
  Administered 2019-04-27 – 2019-04-28 (×2): 30 mL via ORAL
  Filled 2019-04-26: qty 30

## 2019-04-26 MED ORDER — VITAMIN D 25 MCG (1000 UNIT) PO TABS
2000.0000 [IU] | ORAL_TABLET | Freq: Every day | ORAL | Status: DC
  Administered 2019-04-27 – 2019-04-28 (×2): 2000 [IU] via ORAL
  Filled 2019-04-26 (×2): qty 2

## 2019-04-26 MED ORDER — BUPIVACAINE HCL (PF) 0.5 % IJ SOLN
INTRAMUSCULAR | Status: DC | PRN
  Administered 2019-04-26: 3 mL

## 2019-04-26 MED ORDER — CELECOXIB 200 MG PO CAPS
ORAL_CAPSULE | ORAL | Status: AC
  Administered 2019-04-26: 400 mg via ORAL
  Filled 2019-04-26: qty 2

## 2019-04-26 MED ORDER — CLINDAMYCIN PHOSPHATE 600 MG/50ML IV SOLN
600.0000 mg | Freq: Four times a day (QID) | INTRAVENOUS | Status: AC
  Administered 2019-04-26 – 2019-04-27 (×4): 600 mg via INTRAVENOUS
  Filled 2019-04-26 (×4): qty 50

## 2019-04-26 SURGICAL SUPPLY — 79 items
ATTUNE PS FEM RT SZ 4 CEM KNEE (Femur) ×2 IMPLANT
ATTUNE PSRP INSR SZ4 5 KNEE (Insert) ×2 IMPLANT
BASE TIBIAL ROT PLAT SZ 3 KNEE (Knees) ×1 IMPLANT
BATTERY INSTRU NAVIGATION (MISCELLANEOUS) ×8 IMPLANT
BLADE SAW 70X12.5 (BLADE) ×2 IMPLANT
BLADE SAW 90X13X1.19 OSCILLAT (BLADE) ×2 IMPLANT
BLADE SAW 90X25X1.19 OSCILLAT (BLADE) ×2 IMPLANT
BSPLAT TIB 3 CMNT ROT PLAT STR (Knees) ×1 IMPLANT
BTRY SRG DRVR LF (MISCELLANEOUS) ×4
CANISTER SUCT 3000ML PPV (MISCELLANEOUS) ×2 IMPLANT
CEMENT HV SMART SET (Cement) ×4 IMPLANT
COOLER POLAR GLACIER W/PUMP (MISCELLANEOUS) ×2 IMPLANT
COVER WAND RF STERILE (DRAPES) ×2 IMPLANT
CUFF TOURN SGL QUICK 24 (TOURNIQUET CUFF)
CUFF TOURN SGL QUICK 30 (TOURNIQUET CUFF)
CUFF TRNQT CYL 24X4X16.5-23 (TOURNIQUET CUFF) IMPLANT
CUFF TRNQT CYL 30X4X21-28X (TOURNIQUET CUFF) IMPLANT
DRAPE 3/4 80X56 (DRAPES) ×2 IMPLANT
DRSG DERMACEA 8X12 NADH (GAUZE/BANDAGES/DRESSINGS) ×2 IMPLANT
DRSG OPSITE POSTOP 4X12 (GAUZE/BANDAGES/DRESSINGS) ×2 IMPLANT
DRSG OPSITE POSTOP 4X14 (GAUZE/BANDAGES/DRESSINGS) ×2 IMPLANT
DRSG TEGADERM 4X4.75 (GAUZE/BANDAGES/DRESSINGS) ×2 IMPLANT
DURAPREP 26ML APPLICATOR (WOUND CARE) ×4 IMPLANT
ELECT REM PT RETURN 9FT ADLT (ELECTROSURGICAL) ×2
ELECTRODE REM PT RTRN 9FT ADLT (ELECTROSURGICAL) ×1 IMPLANT
EX-PIN ORTHOLOCK NAV 4X150 (PIN) ×4 IMPLANT
GLOVE BIO SURGEON STRL SZ7.5 (GLOVE) ×4 IMPLANT
GLOVE BIOGEL M STRL SZ7.5 (GLOVE) ×4 IMPLANT
GLOVE BIOGEL PI IND STRL 7.5 (GLOVE) ×1 IMPLANT
GLOVE BIOGEL PI INDICATOR 7.5 (GLOVE) ×1
GLOVE INDICATOR 8.0 STRL GRN (GLOVE) ×2 IMPLANT
GOWN STRL REUS W/ TWL LRG LVL3 (GOWN DISPOSABLE) ×2 IMPLANT
GOWN STRL REUS W/ TWL XL LVL3 (GOWN DISPOSABLE) ×1 IMPLANT
GOWN STRL REUS W/TWL LRG LVL3 (GOWN DISPOSABLE) ×4
GOWN STRL REUS W/TWL XL LVL3 (GOWN DISPOSABLE) ×2
HEMOVAC 400CC 10FR (MISCELLANEOUS) ×2 IMPLANT
HOLDER FOLEY CATH W/STRAP (MISCELLANEOUS) ×2 IMPLANT
HOOD PEEL AWAY FLYTE STAYCOOL (MISCELLANEOUS) ×4 IMPLANT
KIT TURNOVER KIT A (KITS) ×2 IMPLANT
KNIFE SCULPS 14X20 (INSTRUMENTS) ×2 IMPLANT
LABEL OR SOLS (LABEL) ×2 IMPLANT
MANIFOLD NEPTUNE II (INSTRUMENTS) ×2 IMPLANT
NDL SAFETY ECLIPSE 18X1.5 (NEEDLE) ×1 IMPLANT
NEEDLE HYPO 18GX1.5 SHARP (NEEDLE) ×2
NEEDLE SPNL 20GX3.5 QUINCKE YW (NEEDLE) ×4 IMPLANT
NS IRRIG 500ML POUR BTL (IV SOLUTION) ×2 IMPLANT
PACK TOTAL KNEE (MISCELLANEOUS) ×2 IMPLANT
PAD ABD DERMACEA PRESS 5X9 (GAUZE/BANDAGES/DRESSINGS) ×2 IMPLANT
PAD CAST CTTN 4X4 STRL (SOFTGOODS) ×1 IMPLANT
PAD WRAPON POLAR KNEE (MISCELLANEOUS) ×1 IMPLANT
PADDING CAST COTTON 4X4 STRL (SOFTGOODS) ×2
PATELLA MEDIAL ATTUN 35MM KNEE (Knees) ×2 IMPLANT
PENCIL SMOKE ULTRAEVAC 22 CON (MISCELLANEOUS) ×2 IMPLANT
PIN DRILL QUICK PACK ×2 IMPLANT
PIN FIXATION 1/8DIA X 3INL (PIN) ×6 IMPLANT
PULSAVAC PLUS IRRIG FAN TIP (DISPOSABLE) ×2
SOL .9 NS 3000ML IRR  AL (IV SOLUTION) ×1
SOL .9 NS 3000ML IRR AL (IV SOLUTION) ×1
SOL .9 NS 3000ML IRR UROMATIC (IV SOLUTION) ×1 IMPLANT
SOL PREP PVP 2OZ (MISCELLANEOUS) ×2
SOLUTION PREP PVP 2OZ (MISCELLANEOUS) ×1 IMPLANT
SPONGE DRAIN TRACH 4X4 STRL 2S (GAUZE/BANDAGES/DRESSINGS) ×2 IMPLANT
STAPLER SKIN PROX 35W (STAPLE) ×2 IMPLANT
STOCKINETTE BIAS CUT 6 980064 (GAUZE/BANDAGES/DRESSINGS) ×2 IMPLANT
STOCKINETTE IMPERV 14X48 (MISCELLANEOUS) IMPLANT
STRAP TIBIA SHORT (MISCELLANEOUS) ×2 IMPLANT
SUCTION FRAZIER HANDLE 10FR (MISCELLANEOUS) ×1
SUCTION TUBE FRAZIER 10FR DISP (MISCELLANEOUS) ×1 IMPLANT
SUT VIC AB 0 CT1 36 (SUTURE) ×4 IMPLANT
SUT VIC AB 1 CT1 36 (SUTURE) ×4 IMPLANT
SUT VIC AB 2-0 CT2 27 (SUTURE) ×2 IMPLANT
SYR 20ML LL LF (SYRINGE) ×2 IMPLANT
SYR 30ML LL (SYRINGE) ×4 IMPLANT
TIBIAL BASE ROT PLAT SZ 3 KNEE (Knees) ×2 IMPLANT
TIP FAN IRRIG PULSAVAC PLUS (DISPOSABLE) ×1 IMPLANT
TOWEL OR 17X26 4PK STRL BLUE (TOWEL DISPOSABLE) ×2 IMPLANT
TOWER CARTRIDGE SMART MIX (DISPOSABLE) ×2 IMPLANT
TRAY FOLEY MTR SLVR 16FR STAT (SET/KITS/TRAYS/PACK) ×2 IMPLANT
WRAPON POLAR PAD KNEE (MISCELLANEOUS) ×2

## 2019-04-26 NOTE — Anesthesia Post-op Follow-up Note (Signed)
Anesthesia QCDR form completed.        

## 2019-04-26 NOTE — Anesthesia Preprocedure Evaluation (Addendum)
Anesthesia Evaluation  Patient identified by MRN, date of birth, ID band Patient awake    Reviewed: Allergy & Precautions, H&P , NPO status , Patient's Chart, lab work & pertinent test results  Airway Mallampati: I  TM Distance: >3 FB     Dental  (+) Teeth Intact   Pulmonary neg pulmonary ROS, neg COPD,           Cardiovascular (-) angina(-) Past MI and (-) Cardiac Stents negative cardio ROS  (-) dysrhythmias      Neuro/Psych Seizures -,  Anxiety H/o seizure x 1 and multiple episodes of aura; takes Lamictal with no recent issues Denies nerve pain/weakness/numbness in legs    GI/Hepatic negative GI ROS, Neg liver ROS,   Endo/Other  negative endocrine ROS  Renal/GU      Musculoskeletal   Abdominal   Peds  Hematology negative hematology ROS (+)   Anesthesia Other Findings Past Medical History: No date: Anxiety No date: Arthritis No date: Back pain No date: Diverticulosis No date: IBS (irritable bowel syndrome) 12/21/2007: Osteopenia No date: Seizures ([place])     Comment:  X 1 IN 2009  Past Surgical History: 1998: AUGMENTATION MAMMAPLASTY; Bilateral     Comment:  removed 2013 1983: BREAST SURGERY     Comment:  bilateral implants 1999: cervical disease     Comment:  H/O Cryo 05/13/2016: CHONDROPLASTY; Right     Comment:  Procedure: CHONDROPLASTY;  Surgeon: Dereck Leep, MD;               Location: [facility] ORS;  Service: Orthopedics;  Laterality:               Right; 05/01/2005: COLONOSCOPY No date: FOOT SURGERY; Bilateral 05/13/2016: KNEE ARTHROSCOPY; Right     Comment:  Procedure: ARTHROSCOPY KNEE WITH MEDIAL AND LATERAL               MENISECTOMY;  Surgeon: Dereck Leep, MD;  Location:               [facility] ORS;  Service: Orthopedics;  Laterality: Right; 2007: LASIK No date: MOUTH SURGERY     Comment:  DENTAL IMPLANTS (TOP) No date: VAGINAL DELIVERY     Comment:  x 1      Reproductive/Obstetrics negative OB ROS                           Anesthesia Physical Anesthesia Plan  ASA: II  Anesthesia Plan: Spinal   Post-op Pain Management:    Induction:   PONV Risk Score and Plan: Propofol infusion  Airway Management Planned: Natural Airway and Simple Face Mask  Additional Equipment:   Intra-op Plan:   Post-operative Plan:   Informed Consent: I have reviewed the patients History and Physical, chart, labs and discussed the procedure including the risks, benefits and alternatives for the proposed anesthesia with the patient or authorized representative who has indicated his/her understanding and acceptance.     Dental Advisory Given  Plan Discussed with: Anesthesiologist and CRNA  Anesthesia Plan Comments:         Anesthesia Quick Evaluation

## 2019-04-26 NOTE — Op Note (Signed)
OPERATIVE NOTE  DATE OF SURGERY:  04/26/2019  PATIENT NAME:  Lisa Yu   DOB: [birth year]  MRN: [id number]  PRE-OPERATIVE DIAGNOSIS: Degenerative arthrosis of the right knee, primary  POST-OPERATIVE DIAGNOSIS:  Same  PROCEDURE:  Right total knee arthroplasty using computer-assisted navigation  SURGEON:  Marciano Sequin. M.D.  ASSISTANT: Cassell Smiles, PA-C (present and scrubbed throughout the case, critical for assistance with exposure, retraction, instrumentation, and closure)  ANESTHESIA: spinal  ESTIMATED BLOOD LOSS: 50 mL  FLUIDS REPLACED: 1700 mL of crystalloid  TOURNIQUET TIME: 99 minutes  DRAINS: 2 medium Hemovac drains  SOFT TISSUE RELEASES: Anterior cruciate ligament, posterior cruciate ligament, deep medial collateral ligament, patellofemoral ligament  IMPLANTS UTILIZED: DePuy Attune size 4 posterior stabilized femoral component (cemented), size 3 rotating platform tibial component (cemented), 35 mm medialized dome patella (cemented), and a 5 mm stabilized rotating platform polyethylene insert.  INDICATIONS FOR SURGERY: Lisa Yu is a 68 y.o. year old female with a long history of progressive knee pain. X-rays demonstrated severe degenerative changes in tricompartmental fashion. The patient had not seen any significant improvement despite conservative nonsurgical intervention. After discussion of the risks and benefits of surgical intervention, the patient expressed understanding of the risks benefits and agree with plans for total knee arthroplasty.   The risks, benefits, and alternatives were discussed at length including but not limited to the risks of infection, bleeding, nerve injury, stiffness, blood clots, the need for revision surgery, cardiopulmonary complications, among others, and they were willing to proceed.  PROCEDURE IN DETAIL: The patient was brought into the operating room and, after adequate spinal anesthesia was achieved, a tourniquet was placed  on the patient's upper thigh. The patient's knee and leg were cleaned and prepped with alcohol and DuraPrep and draped in the usual sterile fashion. A "timeout" was performed as per usual protocol. The lower extremity was exsanguinated using an Esmarch, and the tourniquet was inflated to 300 mmHg. An anterior longitudinal incision was made followed by a standard mid vastus approach. The deep fibers of the medial collateral ligament were elevated in a subperiosteal fashion off of the medial flare of the tibia so as to maintain a continuous soft tissue sleeve. The patella was subluxed laterally and the patellofemoral ligament was incised. Inspection of the knee demonstrated severe degenerative changes with full-thickness loss of articular cartilage. Osteophytes were debrided using a rongeur. Anterior and posterior cruciate ligaments were excised. Two 4.0 mm Schanz pins were inserted in the femur and into the tibia for attachment of the array of trackers used for computer-assisted navigation. Hip center was identified using a circumduction technique. Distal landmarks were mapped using the computer. The distal femur and proximal tibia were mapped using the computer. The distal femoral cutting guide was positioned using computer-assisted navigation so as to achieve a 5 distal valgus cut. The femur was sized and it was felt that a size 4 femoral component was appropriate. A size 4 femoral cutting guide was positioned and the anterior cut was performed and verified using the computer. This was followed by completion of the posterior and chamfer cuts. Femoral cutting guide for the central box was then positioned in the center box cut was performed.  Attention was then directed to the proximal tibia. Medial and lateral menisci were excised. The extramedullary tibial cutting guide was positioned using computer-assisted navigation so as to achieve a 0 varus-valgus alignment and 3 posterior slope. The cut was performed and  verified using the computer. The proximal tibia was  sized and it was felt that a size 3 tibial tray was appropriate. Tibial and femoral trials were inserted followed by insertion of a 5 mm polyethylene insert. This allowed for excellent mediolateral soft tissue balancing both in flexion and in full extension. Finally, the patella was cut and prepared so as to accommodate a 35 mm medialized dome patella. A patella trial was placed and the knee was placed through a range of motion with excellent patellar tracking appreciated. The femoral trial was removed after debridement of posterior osteophytes. The central post-hole for the tibial component was reamed followed by insertion of a keel punch. Tibial trials were then removed. Cut surfaces of bone were irrigated with copious amounts of normal saline with antibiotic solution using pulsatile lavage and then suctioned dry. Polymethylmethacrylate cement was prepared in the usual fashion using a vacuum mixer. Cement was applied to the cut surface of the proximal tibia as well as along the undersurface of a size 3 rotating platform tibial component. Tibial component was positioned and impacted into place. Excess cement was removed using [employer] fast streamer. Cement was then applied to the cut surfaces of the femur as well as along the posterior flanges of the size 4 femoral component. The femoral component was positioned and impacted into place. Excess cement was removed using [employer] fast streamer. A 5 mm polyethylene trial was inserted and the knee was brought into full extension with steady axial compression applied. Finally, cement was applied to the backside of a 35 mm medialized dome patella and the patellar component was positioned and patellar clamp applied. Excess cement was removed using [employer] fast streamer. After adequate curing of the cement, the tourniquet was deflated after a total tourniquet time of 99 minutes. Hemostasis was achieved using electrocautery. The knee was  irrigated with copious amounts of normal saline with antibiotic solution using pulsatile lavage and then suctioned dry. 20 mL of 1.3% Exparel and 60 mL of 0.25% Marcaine in 40 mL of normal saline was injected along the posterior capsule, medial and lateral gutters, and along the arthrotomy site. A 5 mm stabilized rotating platform polyethylene insert was inserted and the knee was placed through a range of motion with excellent mediolateral soft tissue balancing appreciated and excellent patellar tracking noted. 2 medium drains were placed in the wound bed and brought out through separate stab incisions. The medial parapatellar portion of the incision was reapproximated using interrupted sutures of #1 Vicryl. Subcutaneous tissue was approximated in layers using first #0 Vicryl followed #2-0 Vicryl. The skin was approximated with skin staples. A sterile dressing was applied.  The patient tolerated the procedure well and was transported to the recovery room in stable condition.     P. Holley Bouche., M.D.

## 2019-04-26 NOTE — TOC Progression Note (Signed)
Transition of Care [facility]) - Progression Note    Patient Details  Name: Lisa Yu MRN: [id number] Date of Birth: 1950-10-30  [facility] CM/SW [place], RN Phone Number: 04/26/2019, 3:33 PM  Clinical Narrative:     Requested the price of Lovenox, will notify the patient once obtained      Expected Discharge Plan and Services                                                 Social Determinants of Health (SDOH) Interventions    Readmission Risk Interventions No flowsheet data found.

## 2019-04-26 NOTE — Anesthesia Procedure Notes (Signed)
Spinal  Patient location during procedure: OR Start time: 04/26/2019 11:19 AM End time: 04/26/2019 11:24 AM Staffing Resident/CRNA: Nelda Marseille, CRNA Performed: resident/CRNA  Preanesthetic Checklist Completed: patient identified, site marked, surgical consent, pre-op evaluation, timeout performed, IV checked, risks and benefits discussed and monitors and equipment checked Spinal Block Patient position: sitting Prep: Betadine Patient monitoring: heart rate, continuous pulse ox, blood pressure and cardiac monitor Approach: midline Location: L3-4 Injection technique: single-shot Needle Needle type: Whitacre and Introducer  Needle gauge: 25 G Needle length: 9 cm Assessment Sensory level: T10 Additional Notes Negative paresthesia. Negative blood return. Positive free-flowing CSF. Expiration date of kit checked and confirmed. Patient tolerated procedure well, without complications.

## 2019-04-26 NOTE — OR Nursing (Signed)
Patient complainr of "dry eyes", states has this trouble at home ana uses Refresh eye drops.  Denies pain in eyes. Bilat. eyes slightly red.  Dr. Rosey Bath at bedside and aware.  Artificial tears ordered.

## 2019-04-26 NOTE — Transfer of Care (Signed)
Immediate Anesthesia Transfer of Care Note  Patient: Lisa Yu  Procedure(s) Performed: COMPUTER ASSISTED TOTAL KNEE ARTHROPLASTY (Right Knee)  Patient Location: PACU  Anesthesia Type:General  Level of Consciousness: sedated  Airway & Oxygen Therapy: Patient Spontanous Breathing and Patient connected to face mask oxygen  Post-op Assessment: Report given to RN and Post -op Vital signs reviewed and stable  Post vital signs: Reviewed and stable  Last Vitals:  Vitals Value Taken Time  BP    Temp    Pulse 90 04/26/19 1508  Resp 27 04/26/19 1508  SpO2 95 % 04/26/19 1508  Vitals shown include unvalidated device data.  Last Pain:  Vitals:   04/26/19 0935  TempSrc: Oral  PainSc: 5          Complications: No apparent anesthesia complications

## 2019-04-26 NOTE — Anesthesia Procedure Notes (Signed)
Date/Time: 04/26/2019 12:00 PM Performed by: Nelda Marseille, CRNA Pre-anesthesia Checklist: Patient identified, Emergency Drugs available, Suction available, Patient being monitored and Timeout performed Oxygen Delivery Method: Simple face mask

## 2019-04-26 NOTE — H&P (Signed)
The patient has been re-examined, and the chart reviewed, and there have been no interval changes to the documented history and physical.    The risks, benefits, and alternatives have been discussed at length. The patient expressed understanding of the risks benefits and agreed with plans for surgical intervention.  James P. Hooten, Jr. M.D.    

## 2019-04-27 NOTE — TOC Benefit Eligibility Note (Signed)
Transition of Care [facility]) Benefit Eligibility Note    Patient Details  Name: Lisa Yu MRN: [id number] Date of Birth: Dec 12, 1950   Medication/Dose: Enoxaparin  Covered?: Yes  Tier: (4)  Prescription Coverage Preferred Pharmacy: [facility], [facility] and [facility]  Spoke with Person/Company/Phone Number:: Patsey Berthold  Co-Pay: 5.14 per Syringe for a 14 day supply 66.88  Prior Approval: No  Deductible: Met  Additional Notes: Lovenox 40 mg  is not covered    Orbie Pyo Phone Number: 04/27/2019, 9:22 AM

## 2019-04-27 NOTE — Evaluation (Signed)
Physical Therapy Evaluation Patient Details Name: Lisa Yu MRN: [id number] DOB: 1951/03/30 Today's Date: 04/27/2019   History of Present Illness  68 y/o female s/p L TKA 9/30.  Clinical Impression  Pt eager to work with PT and did very well with first session POD1.  She c/o pain in lateral ankle on surgical side t/o the session (ROM, strength, sensation, palpation, etc all appear WNL).  Pt was able to do bed mobility w/o issue, transferred w/o assist and was able to ambulate ~100 ft w/ progressively improving cadence and confidence.  She was able to show very confident quad engagement and did SLRs easily w/o warm up.  Pt nearing 90 PROM flexion and overall is doing well.      Follow Up Recommendations Home health PT    Equipment Recommendations  Rolling walker with 5" wheels    Recommendations for Other Services       Precautions / Restrictions Precautions Precautions: Fall;Knee Restrictions Weight Bearing Restrictions: Yes RLE Weight Bearing: Weight bearing as tolerated      Mobility  Bed Mobility Overal bed mobility: Independent             General bed mobility comments: Pt is able to get herself to sitting w/o issue  Transfers Overall transfer level: Independent Equipment used: Rolling walker (2 wheeled)             General transfer comment: Pt able to rise w/o assist, minimal reliance on UEs, good safety  Ambulation/Gait Ambulation/Gait assistance: Supervision Gait Distance (Feet): 100 Feet Assistive device: Rolling walker (2 wheeled)       General Gait Details: Pt took a while to get into consistent cadence but was ultimately able to maintain consistent cadence with minimal UE reliance  Stairs            Wheelchair Mobility    Modified Rankin (Stroke Patients Only)       Balance Overall balance assessment: Independent                                           Pertinent Vitals/Pain Pain Assessment: 0-10 Pain  Score: 7  Pain Location: Pt c/o of pain in lateral ankle/peroneal tendons as in the knee    Home Living Family/patient expects to be discharged to:: Private residence Living Arrangements: Spouse/significant other Available Help at Discharge: Family   Home Access: Stairs to enter Entrance Stairs-Rails: None(post on L) Technical brewer of Steps: 5 Home Layout: One level Home Equipment: Environmental consultant - 4 wheels      Prior Function Level of Independence: Independent         Comments: Pt reports less activity recently, but able to drive, etc     Hand Dominance        Extremity/Trunk Assessment   Upper Extremity Assessment Upper Extremity Assessment: Overall WFL for tasks assessed    Lower Extremity Assessment Lower Extremity Assessment: Overall WFL for tasks assessed(expected post-op weakness, able to do SLRs easily)       Communication   Communication: No difficulties  Cognition Arousal/Alertness: Awake/alert Behavior During Therapy: WFL for tasks assessed/performed Overall Cognitive Status: Within Functional Limits for tasks assessed                                        General  Comments      Exercises Total Joint Exercises Ankle Circles/Pumps: AROM;10 reps Quad Sets: Strengthening;10 reps Short Arc Quad: Strengthening;15 reps Heel Slides: Strengthening;10 reps Hip ABduction/ADduction: Strengthening;10 reps Straight Leg Raises: Strengthening;10 reps Knee Flexion: PROM;5 reps Goniometric ROM: 0-87   Assessment/Plan    PT Assessment Patient needs continued PT services  PT Problem List Decreased strength;Decreased activity tolerance;Decreased range of motion;Decreased balance;Decreased mobility;Decreased coordination;Decreased knowledge of use of DME;Decreased safety awareness;Pain       PT Treatment Interventions DME instruction;Gait training;Stair training;Functional mobility training;Therapeutic activities;Balance training;Therapeutic  exercise;Neuromuscular re-education;Patient/family education    PT Goals (Current goals can be found in the Care Plan section)  Acute Rehab PT Goals Patient Stated Goal: go home PT Goal Formulation: With patient Time For Goal Achievement: 05/11/19 Potential to Achieve Goals: Good    Frequency BID   Barriers to discharge        Co-evaluation               AM-PAC PT "6 Clicks" Mobility  Outcome Measure Help needed turning from your back to your side while in a flat bed without using bedrails?: None Help needed moving from lying on your back to sitting on the side of a flat bed without using bedrails?: None Help needed moving to and from a bed to a chair (including a wheelchair)?: None Help needed standing up from a chair using your arms (e.g., wheelchair or bedside chair)?: None Help needed to walk in hospital room?: A Little Help needed climbing 3-5 steps with a railing? : A Little 6 Click Score: 22    End of Session Equipment Utilized During Treatment: Gait belt Activity Tolerance: Patient tolerated treatment well Patient left: with call bell/phone within reach;with chair alarm set Nurse Communication: Mobility status(pt requests ice pack for ) PT Visit Diagnosis: Muscle weakness (generalized) (M62.81);Difficulty in walking, not elsewhere classified (R26.2);Pain Pain - Right/Left: Right Pain - part of body: Leg    Time: [id number] PT Time Calculation (min) (ACUTE ONLY): 42 min   Charges:   PT Evaluation $PT Eval Low Complexity: 1 Low PT Treatments $Gait Training: 8-22 mins $Therapeutic Exercise: 8-22 mins        Kreg Shropshire, DPT 04/27/2019, 11:09 AM

## 2019-04-27 NOTE — TOC Initial Note (Signed)
Transition of Care [facility]) - Initial/Assessment Note    Patient Details  Name: Lisa Yu MRN: [id number] Date of Birth: April 17, 1951  Transition of Care [facility]) CM/SW Contact:    Lilianne Delair, Lenice Llamas Phone Number: [phone]  04/27/2019, 11:22 AM  Clinical Narrative: PT is recommending home health. Clinical Education officer, museum (CSW) met with patient alone at bedside to discuss D/C plan. Patient was alert and oriented X4 and was sitting up in the chair at bedside. CSW introduced self and explained role of CSW department. Patient reported that she lives in [place] with her husband Dewy. Her home address is [address], [place]. CSW explained home health process. Patient does not have a home health agency preference. CSW explained that [facility]'s [facility] however [facility] not in [place] [facility] [employer]. CSW explained that [facility] home health can accept patient. Patient is agreeable to [facility] and requested a female PT. CSW provided patient with CMS home health list. Lauretta Chester home health representative is aware of above. Patient reported that she has a rollaider at home and needs a rolling walker and a bedside commode. [employer] D[employer] agency representative is aware of above. CSW notified patient of her Lovenox price $66.88. Patient reported no other needs or concerns. MD aware of above. CSW will continue to follow and assist as needed.                   Expected Discharge Plan: [place] Barriers to Discharge: Continued Medical Work up   Patient Goals and CMS Choice Patient states their goals for this hospitalization and ongoing recovery are:: To go home. CMS Medicare.gov Compare Post Acute Care list provided to:: Patient Choice offered to / list presented to : Patient  Expected Discharge Plan and Services Expected Discharge Plan: [place] In-house Referral: Clinical Social Work   Post Acute Care  Choice: [place] arrangements for the past 2 months: [place]                 DME Arranged: Bedside commode, Walker rolling DME Agency: [employer] Date DME Agency Contacted: 04/27/19 Time DME Agency Contacted: 1120 Representative spoke with at DME Agency: [place]: PT [place]: [facility] [place] Date [place]: 04/27/19 Time [place]: [phone] Representative spoke with at [place]: [place]  Prior Living Arrangements/Services Living arrangements for the past 2 months: [place] with:: Spouse Patient language and need for interpreter reviewed:: No Do you feel safe going back to the place where you live?: Yes      Need for Family Participation in Patient Care: No (Comment) Care giver support system in place?: Yes (comment)   Criminal Activity/Legal Involvement Pertinent to Current Situation/Hospitalization: No - Comment as needed  Activities of Daily Living Home Assistive Devices/Equipment: Gilford Rile (specify type) ADL Screening (condition at time of admission) Patient's cognitive ability adequate to safely complete daily activities?: Yes Is the patient deaf or have difficulty hearing?: No Does the patient have difficulty seeing, even when wearing glasses/contacts?: No Does the patient have difficulty concentrating, remembering, or making decisions?: No Patient able to express need for assistance with ADLs?: Yes Does the patient have difficulty dressing or bathing?: No Independently performs ADLs?: Yes (appropriate for developmental age) Does the patient have difficulty walking or climbing stairs?: Yes Weakness of Legs: Right Weakness of Arms/Hands: None  Permission Sought/Granted Permission sought to share information  with : Other (comment)(Home Health agency and DME agency) Permission granted to share information with : Yes, Verbal Permission Granted              Emotional Assessment Appearance:: Appears stated  age Attitude/Demeanor/Rapport: Engaged Affect (typically observed): Pleasant, Calm Orientation: : Oriented to Self, Oriented to Place, Oriented to  Time, Oriented to Situation Alcohol / Substance Use: Not Applicable Psych Involvement: No (comment)  Admission diagnosis:  PRIMARY OSTEOARTHRTITIS OF RIGHT KNEE. Patient Active Problem List   Diagnosis Date Noted  . Total knee replacement status 04/26/2019  . Hyperlipidemia, mixed 10/17/2018  . Low serum vitamin D 10/17/2018  . Well woman exam without gynecological exam 06/16/2018  . Lumbar radiculopathy 06/16/2018  . Anxiety 10/27/2017  . OA (osteoarthritis) of knee 03/22/2017  . Grief 01/04/2017  . Insomnia 01/16/2016  . Complex partial seizure ([place]) 05/10/2014  . IBS (irritable bowel syndrome) 11/30/2013  . Postmenopausal HRT (hormone replacement therapy) 11/30/2013  . Elevated blood pressure reading without diagnosis of hypertension 06/20/2013  . Partial epilepsy with impairment of consciousness ([place]) 05/09/2013  . Iron deficiency anemia, unspecified 01/23/2011  . Convulsions ([place]) 02/25/2008  . DIVERTICULOSIS, COLON 12/27/2007  . STATE, SYMPTOMATIC MENOPAUSE/FEM CLIMACTERIC 12/27/2007  . SPONDYLOSIS, LUMBAR 12/27/2007  . OSTEOPENIA 12/27/2007  . BREAST IMPLANTS, BILATERAL, HX OF 12/27/2007   PCP:  Rusty Aus, MD Pharmacy:   [id number] [place] [address]Phone: [phone]: [phone]    Social Determinants of Health (SDOH) Interventions    Readmission Risk Interventions No flowsheet data found.

## 2019-04-27 NOTE — Anesthesia Postprocedure Evaluation (Signed)
Anesthesia Post Note  Patient: Lisa Yu  Procedure(s) Performed: COMPUTER ASSISTED TOTAL KNEE ARTHROPLASTY (Right Knee)  Patient location during evaluation: Nursing Unit Anesthesia Type: Spinal Level of consciousness: oriented and awake and alert Pain management: pain level controlled Vital Signs Assessment: post-procedure vital signs reviewed and stable Respiratory status: spontaneous breathing and respiratory function stable Cardiovascular status: blood pressure returned to baseline and stable Postop Assessment: no headache, no backache, no apparent nausea or vomiting and patient able to bend at knees Anesthetic complications: no     Last Vitals:  Vitals:   04/26/19 2329 04/27/19 0410  BP: 120/69 (!) 123/54  Pulse: 67 69  Resp: 19 18  Temp: 36.7 C 36.5 C  SpO2: 96% 96%    Last Pain:  Vitals:   04/27/19 0410  TempSrc: Oral  PainSc:                  Jerrye Noble

## 2019-04-27 NOTE — Evaluation (Signed)
Occupational Therapy Evaluation Patient Details Name: Lisa Yu MRN: [id number] DOB: 05-30-1951 Today's Date: 04/27/2019    History of Present Illness 68 y/o female s/p L TKA 9/30.   Clinical Impression   Ms. Heinzman was seen for OT evaluation this date, POD#1 from above surgery. Pt was active and independent in all ADLs prior to surgery. Pt is eager to return to PLOF with less pain and improved safety and independence. Pt currently requires minimal assist for LB dressing while in seated position due to pain and limited AROM of R knee. Pt instructed in polar care mgt, falls prevention strategies, home/routines modifications, DME/AE for LB bathing and dressing tasks, and compression stocking mgt. Handout provided. Pt would benefit from skilled OT services including additional instruction in dressing techniques with or without assistive devices for dressing and bathing skills to support recall and carryover prior to discharge and ultimately to maximize safety, independence, and minimize falls risk and caregiver burden. Do not currently anticipate any OT needs following this hospitalization.       Follow Up Recommendations  No OT follow up    Equipment Recommendations  3 in 1 bedside commode    Recommendations for Other Services       Precautions / Restrictions Precautions Precautions: Fall;Knee Restrictions Weight Bearing Restrictions: Yes RLE Weight Bearing: Weight bearing as tolerated      Mobility Bed Mobility Overal bed mobility: Independent             General bed mobility comments: Deferred. Pt up in recliner at star/end of session. Per physical therapy pt independent with bed mobility this date.  Transfers Overall transfer level: Independent Equipment used: Rolling walker (2 wheeled)             General transfer comment: Pt able to rise w/o assist, minimal reliance on UEs, good safety    Balance Overall balance assessment: Independent;No apparent balance  deficits (not formally assessed)                                         ADL either performed or assessed with clinical judgement   ADL Overall ADL's : Needs assistance/impaired                                       General ADL Comments: Pt req. min to moderate assist with LB ADL mgt due to increased pain and decreased ROM in the RLE.     Vision Baseline Vision/History: Wears glasses Wears Glasses: Reading only Patient Visual Report: No change from baseline       Perception     Praxis      Pertinent Vitals/Pain Pain Assessment: 0-10 Pain Score: 8  Pain Location: Pt c/o of pain in lateral ankle/peroneal tendons as in the knee Pain Descriptors / Indicators: Sore;Burning Pain Intervention(s): Limited activity within patient's tolerance;Monitored during session;Repositioned     Hand Dominance Right   Extremity/Trunk Assessment Upper Extremity Assessment Upper Extremity Assessment: Overall WFL for tasks assessed(BUE WNL)   Lower Extremity Assessment Lower Extremity Assessment: Defer to PT evaluation;RLE deficits/detail RLE Deficits / Details: s/p TKA       Communication Communication Communication: No difficulties   Cognition Arousal/Alertness: Awake/alert Behavior During Therapy: WFL for tasks assessed/performed Overall Cognitive Status: Within Functional Limits for tasks assessed  General Comments: Mildly preoccupied with ankle pain. Able to be re-directed   General Comments  Pt R knee wound vac in place at start/end of session.    Exercises Exercises: Total Joint Total Joint Exercises Ankle Circles/Pumps: AROM;10 reps Quad Sets: Strengthening;10 reps Short Arc Quad: Strengthening;15 reps Heel Slides: Strengthening;10 reps Hip ABduction/ADduction: Strengthening;10 reps Straight Leg Raises: Strengthening;10 reps Knee Flexion: PROM;5 reps Goniometric ROM: 0-87 Other  Exercises Other Exercises: Pt educated in falls prevention strategies, safe use of AE/DME for LB ADL mgt, knee precautions, and compression stocking mgt this date. Pt able to return demonstrate understanding of education provided by using a sock aid to donn socks this date. Would benefit from reinforcement of education provided.   Shoulder Instructions      Home Living Family/patient expects to be discharged to:: Private residence Living Arrangements: Spouse/significant other Available Help at Discharge: Family Type of Home: House Home Access: Stairs to enter [employer] of Steps: 5 Entrance Stairs-Rails: None(Post on L) Home Layout: One level     Bathroom Shower/Tub: Tub/shower unit;Curtain   Bathroom Toilet: Handicapped height     Home Equipment: Environmental consultant - 4 wheels;Cane - single point          Prior Functioning/Environment Level of Independence: Independent        Comments: Pt endorses driving, 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 Not using an AD for amb. No falls history.        OT Problem List: Decreased strength;Decreased coordination;Pain;Decreased activity tolerance;Decreased safety awareness;Decreased range of motion;Impaired balance (sitting and/or standing);Decreased knowledge of use of DME or AE;Decreased knowledge of precautions      OT Treatment/Interventions: Self-care/ADL training;Balance training;Therapeutic exercise;Therapeutic activities;DME and/or AE instruction;Patient/family education    OT Goals(Current goals can be found in the care plan section) Acute Rehab OT Goals Patient Stated Goal: go home OT Goal Formulation: With patient Time For Goal Achievement: 05/11/19 Potential to Achieve Goals: Good ADL Goals Pt Will Perform Lower Body Bathing: sitting/lateral leans;with modified independence(With LRAD PRN for improved safety and functional independence.) Pt Will Perform  Lower Body Dressing: sit to/from stand;with modified independence(With LRAD PRN for improved safety and functional independence.) Pt Will Transfer to Toilet: ambulating;with modified independence(With LRAD PRN for improved safety and functional independence.) Pt Will Perform Toileting - Clothing Manipulation and hygiene: sit to/from stand;with modified independence(With LRAD PRN for improved safety and functional independence.)  OT Frequency: Min 1X/week   Barriers to D/C:            Co-evaluation              AM-PAC OT "6 Clicks" Daily Activity     Outcome Measure Help from another person eating meals?: None Help from another person taking care of personal grooming?: None Help from another person toileting, which includes using toliet, bedpan, or urinal?: A Little Help from another person bathing (including washing, rinsing, drying)?: A Little Help from another person to put on and taking off regular upper body clothing?: None Help from another person to put on and taking off regular lower body clothing?: A Little 6 Click Score: 21   End of Session    Activity Tolerance: Patient tolerated treatment well Patient left: in chair;with call bell/phone within reach;with chair alarm set;with SCD's reapplied(With heels floated)  OT Visit Diagnosis: Other abnormalities of gait and mobility (R26.89);Pain Pain - Right/Left: Right Pain - part of body: Knee;Leg;Ankle and joints of foot  Time: 1106-1130 OT Time Calculation (min): 24 min Charges:  OT General Charges $OT Visit: 1 Visit OT Evaluation $OT Eval Low Complexity: 1 Low OT Treatments $Self Care/Home Management : 8-22 mins  Shara Blazing, M.S., OTR/L Ascom: [phone] 04/27/19, 12:21 PM

## 2019-04-27 NOTE — Progress Notes (Signed)
Physical Therapy Treatment Patient Details Name: Lisa Yu MRN: [id number] DOB: December 28, 1950 Today's Date: 04/27/2019    History of Present Illness 68 y/o female s/p L TKA 9/30.    PT Comments    Stood and completed lap around nursing unit with RW and supervision.  No LOB or buckling noted with steady gait.  Returned to bed after gait per her request.  Continues with some ankle discomfort with gait.    Follow Up Recommendations  Home health PT     Equipment Recommendations  Rolling walker with 5" wheels    Recommendations for Other Services       Precautions / Restrictions Precautions Precautions: Fall;Knee Restrictions Weight Bearing Restrictions: Yes RLE Weight Bearing: Weight bearing as tolerated    Mobility  Bed Mobility Overal bed mobility: Modified Independent             General bed mobility comments: Deferred. Pt up in recliner at star/end of session. Per physical therapy pt independent with bed mobility this date.  Transfers Overall transfer level: Independent Equipment used: Rolling walker (2 wheeled)             General transfer comment: Pt able to rise w/o assist, minimal reliance on UEs, good safety  Ambulation/Gait Ambulation/Gait assistance: Supervision Gait Distance (Feet): 200 Feet Assistive device: Rolling walker (2 wheeled) Gait Pattern/deviations: Step-through pattern         Stairs             Wheelchair Mobility    Modified Rankin (Stroke Patients Only)       Balance Overall balance assessment: Independent;No apparent balance deficits (not formally assessed)                                          Cognition Arousal/Alertness: Awake/alert Behavior During Therapy: WFL for tasks assessed/performed Overall Cognitive Status: Within Functional Limits for tasks assessed                                 General Comments: Mildly preoccupied with ankle pain. Able to be re-directed       Exercises Other Exercises Other Exercises: Pt educated in falls prevention strategies, safe use of AE/DME for LB ADL mgt, knee precautions, and compression stocking mgt this date. Pt able to return demonstrate understanding of education provided by using a sock aid to donn socks this date. Would benefit from reinforcement of education provided.    General Comments General comments (skin integrity, edema, etc.): Pt R knee wound vac in place at start/end of session.      Pertinent Vitals/Pain Pain Assessment: 0-10 Pain Score: 5  Pain Location: Pt c/o of pain in lateral ankle/peroneal tendons as in the knee Pain Descriptors / Indicators: Sore;Burning Pain Intervention(s): Limited activity within patient's tolerance;Monitored during session    [place] expects to be discharged to:: Private residence Living Arrangements: Spouse/significant other Available Help at Discharge: Family Type of Home: House Home Access: Stairs to enter Entrance Stairs-Rails: None(Post on L) Home Layout: One level Home Equipment: Environmental consultant - 4 wheels;Cane - single point      Prior Function Level of Independence: Independent      Comments: Pt endorses driving, 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 Not using an AD for amb. No  falls history.   PT Goals (current goals can now be found in the care plan section) Acute Rehab PT Goals Patient Stated Goal: go home Progress towards PT goals: Progressing toward goals    Frequency    BID      PT Plan      Co-evaluation              AM-PAC PT "6 Clicks" Mobility   Outcome Measure  Help needed turning from your back to your side while in a flat bed without using bedrails?: None Help needed moving from lying on your back to sitting on the side of a flat bed without using bedrails?: None Help needed moving to and from a bed to a chair (including a wheelchair)?:  None Help needed standing up from a chair using your arms (e.g., wheelchair or bedside chair)?: None Help needed to walk in hospital room?: A Little Help needed climbing 3-5 steps with a railing? : A Little 6 Click Score: 22    End of Session Equipment Utilized During Treatment: Gait belt Activity Tolerance: Patient tolerated treatment well Patient left: with call bell/phone within reach;with chair alarm set Nurse Communication: Mobility status(pt requests ice pack for ) Pain - Right/Left: Right Pain - part of body: Leg     Time: [id number] PT Time Calculation (min) (ACUTE ONLY): 12 min  Charges:  $Gait Training: 8-22 mins                     Chesley Noon, PTA 04/27/19, 2:11 PM

## 2019-04-27 NOTE — Progress Notes (Addendum)
  Subjective: 1 Day Post-Op Procedure(s) (LRB): COMPUTER ASSISTED TOTAL KNEE ARTHROPLASTY (Right) Patient reports pain as well-controlled..   Patient is well, and has had no acute complaints or problems Plan is to go Home after hospital stay. Negative for chest pain and shortness of breath Fever: no Gastrointestinal:Negative for nausea and vomiting.   Patient states she did not have any PT yesterday. Otherwise no complaints. She did express concern that she could injure her knee if she moved it incorrectly. She states she has not yet had a bowel movement.   Objective: Vital signs in last 24 hours: Temp:  [97.4 F (36.3 C)-98.2 F (36.8 C)] 97.7 F (36.5 C) (10/01 0410) Pulse Rate:  [67-88] 69 (10/01 0410) Resp:  [12-23] 18 (10/01 0410) BP: (111-144)/(54-83) 123/54 (10/01 0410) SpO2:  [94 %-100 %] 96 % (10/01 0410) Weight:  [79 kg] 79 kg (09/30 0935)  Intake/Output from previous day:  Intake/Output Summary (Last 24 hours) at 04/27/2019 0751 Last data filed at 04/27/2019 0418 Gross per 24 hour  Intake 2136.37 ml  Output 2680 ml  Net -543.63 ml    Intake/Output this shift: No intake/output data recorded.  Labs: No results for input(s): HGB in the last 72 hours. No results for input(s): WBC, RBC, HCT, PLT in the last 72 hours. No results for input(s): NA, K, CL, CO2, BUN, CREATININE, GLUCOSE, CALCIUM in the last 72 hours. No results for input(s): LABPT, INR in the last 72 hours.   EXAM General - Patient is Alert, Appropriate and Oriented Extremity - Neurovascular intact Dressing/Incision - clean, no drainage. Drain and Polar Care remain in place.  Motor Function - intact, moving foot and toes well on exam. Able to perform a straight leg raise.    Assessment/Plan: 1 Day Post-Op Procedure(s) (LRB): COMPUTER ASSISTED TOTAL KNEE ARTHROPLASTY (Right) Active Problems:   Total knee replacement status  Estimated body mass index is 27.69 kg/m as calculated from the  following:   Height as of this encounter: 5' 6.5" (1.689 m).   Weight as of this encounter: 79 kg. Advance diet Up with therapy  DVT Prophylaxis - Lovenox Weight-Bearing as tolerated to right leg  Cassell Smiles, PA-C [facility] Orthopaedic Surgery 04/27/2019, 7:51 AM

## 2019-04-28 MED ORDER — ENOXAPARIN SODIUM 40 MG/0.4ML ~~LOC~~ SOLN: 40.0000 mg | SUBCUTANEOUS | 0 refills | Status: DC

## 2019-04-28 MED ORDER — CELECOXIB 200 MG PO CAPS: 200.0000 mg | ORAL_CAPSULE | Freq: Two times a day (BID) | ORAL | 1 refills | Status: DC

## 2019-04-28 MED ORDER — OXYCODONE HCL 5 MG PO TABS: 5.0000 mg | ORAL_TABLET | ORAL | 0 refills | Status: DC | PRN

## 2019-04-28 NOTE — Progress Notes (Signed)
Physical Therapy Treatment Patient Details Name: Lisa Yu MRN: [id number] DOB: Mar 26, 1951 Today's Date: 04/28/2019    History of Present Illness 68 y/o female s/p L TKA 9/30.    PT Comments    Pt continues to make nice gains with PT and today was able to increase confidence, cadence and speed with gait, negotiate up/down steps and tolerated increased reps with increased resistance with exercises.  She has expected soreness/pain but is not limited by it and reports that ankle/peroneal pain is less this morning.  Pt ready to go home, no safety or progression concerns.   Follow Up Recommendations  Home health PT     Equipment Recommendations  Rolling walker with 5" wheels    Recommendations for Other Services       Precautions / Restrictions Precautions Precautions: Fall;Knee Restrictions RLE Weight Bearing: Weight bearing as tolerated    Mobility  Bed Mobility Overal bed mobility: Modified Independent             General bed mobility comments: Pt again able to get herself to EOB from supine w/o issue  Transfers Overall transfer level: Modified independent Equipment used: Rolling walker (2 wheeled)             General transfer comment: Pt able to rise w/o assist, minimal reliance on UEs, good safety.  Did start to sit before fully squared with recliner, but was able to control descent with UEs.  Ambulation/Gait Ambulation/Gait assistance: Supervision Gait Distance (Feet): 250 Feet Assistive device: Rolling walker (2 wheeled)       General Gait Details: Pt able to attain consistent and confident cadence right away.  She used walker appropriately and was not overly reliant on it.  Pt overall did well.   Stairs Stairs: Yes Stairs assistance: Supervision Stair Management: Two rails Number of Stairs: 4 General stair comments: Pt was able to negotiate up/down steps with good safety   Wheelchair Mobility    Modified Rankin (Stroke Patients Only)        Balance Overall balance assessment: Independent                                          Cognition Arousal/Alertness: Awake/alert Behavior During Therapy: WFL for tasks assessed/performed Overall Cognitive Status: Within Functional Limits for tasks assessed                                        Exercises Total Joint Exercises Ankle Circles/Pumps: AROM;10 reps Quad Sets: Strengthening;15 reps Short Arc Quad: Strengthening;15 reps Heel Slides: Strengthening;15 reps Hip ABduction/ADduction: Strengthening;15 reps Straight Leg Raises: Strengthening;15 reps Knee Flexion: PROM;5 reps Goniometric ROM: 0-103    General Comments        Pertinent Vitals/Pain Pain Assessment: 0-10 Pain Score: 4  Pain Location: less ankle pain this date, but still present    Home Living                      Prior Function            PT Goals (current goals can now be found in the care plan section) Progress towards PT goals: Progressing toward goals    Frequency    BID      PT Plan Current plan remains appropriate  Co-evaluation              AM-PAC PT "6 Clicks" Mobility   Outcome Measure  Help needed turning from your back to your side while in a flat bed without using bedrails?: None Help needed moving from lying on your back to sitting on the side of a flat bed without using bedrails?: None Help needed moving to and from a bed to a chair (including a wheelchair)?: None Help needed standing up from a chair using your arms (e.g., wheelchair or bedside chair)?: None Help needed to walk in hospital room?: None Help needed climbing 3-5 steps with a railing? : None 6 Click Score: 24    End of Session Equipment Utilized During Treatment: Gait belt Activity Tolerance: Patient tolerated treatment well Patient left: with call bell/phone within reach;with chair alarm set Nurse Communication: Patient requests pain meds PT Visit  Diagnosis: Muscle weakness (generalized) (M62.81);Difficulty in walking, not elsewhere classified (R26.2);Pain Pain - Right/Left: Right Pain - part of body: Leg     Time: [id number] PT Time Calculation (min) (ACUTE ONLY): 41 min  Charges:  $Gait Training: 8-22 mins $Therapeutic Activity: 23-37 mins                     Kreg Shropshire, DPT 04/28/2019, 9:51 AM

## 2019-04-28 NOTE — TOC Transition Note (Signed)
Transition of Care [facility]) - CM/SW Discharge Note   Patient Details  Name: Lisa Yu MRN: [id number] Date of Birth: 02/21/1951  Transition of Care [facility] CM/SW Contact:  Su Hilt, RN Phone Number: 04/28/2019, 9:31 AM   Clinical Narrative:     Patient to DC home with [facility] services  Has RW and BSC in the room to take home provided by [employer] Lovenox price given to the patient  No additional Needs  Final next level of care: [place] Barriers to Discharge: Barriers Resolved   Patient Goals and CMS Choice Patient states their goals for this hospitalization and ongoing recovery are:: To go home. CMS Medicare.gov Compare Post Acute Care list provided to:: Patient Choice offered to / list presented to : Patient  Discharge Placement                       Discharge Plan and Services In-house Referral: Clinical Social Work   Post Acute Care Choice: Home Health          DME Arranged: Bedside commode, Walker rolling DME Agency: [employer] Date DME Agency Contacted: 04/27/19 Time DME Agency Contacted: 1120 Representative spoke with at [place]: [place]: PT [place]: [place] Date [place]: 04/27/19 Time [place]: [phone] Representative spoke with at [place]: [place] ([place]) Interventions     Readmission Risk Interventions No flowsheet data found.

## 2019-04-28 NOTE — Discharge Summary (Signed)
Physician Discharge Summary  Subjective: 2 Days Post-Op Procedure(s) (LRB): COMPUTER ASSISTED TOTAL KNEE ARTHROPLASTY (Right) Patient reports pain as mild.   Patient seen in rounds with Dr. Marry Guan. Patient is well, and has had no acute complaints or problems Patient is ready to go home with home health physical therapy  Physician Discharge Summary  Patient ID: Lisa Yu MRN: [id number] DOB/AGE: 69-Mar-1952 69 y.o.  Admit date: 04/26/2019 Discharge date: 04/28/2019  Admission Diagnoses:  Discharge Diagnoses:  Active Problems:   Total knee replacement status   Discharged Condition: fair  Hospital Course: The patient is postop day 2 from a right total knee replacement.  She has done well with physical therapy.  She is ambulated 200 feet.  Her drain was removed this morning.  She has had a bowel movement.  Her vitals have remained stable.  She is ready to go home with home health physical therapy.  Treatments: surgery:  Right total knee arthroplasty using computer-assisted navigation  SURGEON:  Marciano Sequin. M.D.  ASSISTANT: Cassell Smiles, PA-C (present and scrubbed throughout the case, critical for assistance with exposure, retraction, instrumentation, and closure)  ANESTHESIA: spinal  ESTIMATED BLOOD LOSS: 50 mL  FLUIDS REPLACED: 1700 mL of crystalloid  TOURNIQUET TIME: 99 minutes  DRAINS: 2 medium Hemovac drains  SOFT TISSUE RELEASES: Anterior cruciate ligament, posterior cruciate ligament, deep medial collateral ligament, patellofemoral ligament  IMPLANTS UTILIZED: DePuy Attune size 4 posterior stabilized femoral component (cemented), size 3 rotating platform tibial component (cemented), 35 mm medialized dome patella (cemented), and a 5 mm stabilized rotating platform polyethylene insert.  Discharge Exam: Blood pressure 126/64, pulse 76, temperature 97.9 F (36.6 C), temperature source Oral, resp. rate 20, height 5' 6.5" (1.689 m), weight 79 kg, SpO2 97  %.   Disposition:    Allergies as of 04/28/2019      Reactions   Other Itching, Rash   Shellfish containing products   Penicillins    Did it involve swelling of the face/tongue/throat, SOB, or low BP? No Did it involve sudden or severe rash/hives, skin peeling, or any reaction on the inside of your mouth or nose? Yes Did you need to seek medical attention at a hospital or doctor's office? Yes When did it last happen?in her 34s If all above answers are "NO", may proceed with cephalosporin use.      Medication List    STOP taking these medications   meloxicam 15 MG tablet Commonly known as: MOBIC     TAKE these medications   acetaminophen 500 MG tablet Commonly known as: TYLENOL Take 500 mg by mouth every 6 (six) hours as needed.   aspirin 81 MG tablet Take 81 mg by mouth daily.   celecoxib 200 MG capsule Commonly known as: CELEBREX Take 1 capsule (200 mg total) by mouth 2 (two) times daily.   clonazePAM 0.5 MG tablet Commonly known as: KLONOPIN Take 0.5 mg by mouth at bedtime.   enoxaparin 40 MG/0.4ML injection Commonly known as: LOVENOX Inject 0.4 mLs (40 mg total) into the skin daily for 14 doses.   fluticasone 50 MCG/ACT nasal spray Commonly known as: FLONASE Place 2 sprays into both nostrils daily as needed. Reported on 12/27/2015   hyoscyamine 0.125 MG tablet Commonly known as: Levsin Take 1 tablet (0.125 mg total) by mouth every 4 (four) hours as needed. What changed: reasons to take this   lamoTRIgine 200 MG tablet Commonly known as: LAMICTAL Take 1 tablet (200 mg total) by mouth 2 (two) times  daily.   multivitamin tablet Take 1 tablet by mouth daily.   oxyCODONE 5 MG immediate release tablet Commonly known as: Oxy IR/ROXICODONE Take 1 tablet (5 mg total) by mouth every 4 (four) hours as needed for moderate pain (pain score 4-6).   Vitamin D (Cholecalciferol) 50 MCG (2000 UT) Caps Take 2,000 Units by mouth daily.            Durable  Medical Equipment  (From admission, onward)         Start     Ordered   04/26/19 1648  DME Walker rolling  Once    Question:  Patient needs a walker to treat with the following condition  Answer:  Total knee replacement status   04/26/19 1647   04/26/19 1648  DME Bedside commode  Once    Question:  Patient needs a bedside commode to treat with the following condition  Answer:  Total knee replacement status   04/26/19 1647         Follow-up Information    Watt Climes, PA On 05/10/2019.   Specialty: Physician Assistant Why: at 1:15pm Contact information: [place] [address] [phone]        Dereck Leep, MD On 06/08/2019.   Specialty: Orthopedic Surgery Why: at 10:45am Contact information: [place] [address] [phone]           Signed: Prescott Parma, Shanecia Hoganson 04/28/2019, 7:02 AM   Objective: Vital signs in last 24 hours: Temp:  [97.9 F (36.6 C)-98.6 F (37 C)] 97.9 F (36.6 C) (10/01 2332) Pulse Rate:  [72-84] 76 (10/01 2332) Resp:  [16-20] 20 (10/01 2332) BP: (124-131)/(62-74) 126/64 (10/01 2332) SpO2:  [97 %-100 %] 97 % (10/01 2332)  Intake/Output from previous day:  Intake/Output Summary (Last 24 hours) at 04/28/2019 0702 Last data filed at 04/28/2019 0519 Gross per 24 hour  Intake 2370 ml  Output 280 ml  Net 2090 ml    Intake/Output this shift: No intake/output data recorded.  Labs: No results for input(s): HGB in the last 72 hours. No results for input(s): WBC, RBC, HCT, PLT in the last 72 hours. No results for input(s): NA, K, CL, CO2, BUN, CREATININE, GLUCOSE, CALCIUM in the last 72 hours. No results for input(s): LABPT, INR in the last 72 hours.  EXAM: General - Patient is Alert and Oriented Extremity - Neurovascular intact Sensation intact distally Intact pulses distally Compartment soft Incision - clean, dry, with the Hemovac removed.  The Hemovac tubing was intact  on removal. Motor Function -plantarflexion and dorsiflexion are intact.  Able to straight leg raise independently.  Assessment/Plan: 2 Days Post-Op Procedure(s) (LRB): COMPUTER ASSISTED TOTAL KNEE ARTHROPLASTY (Right) Procedure(s) (LRB): COMPUTER ASSISTED TOTAL KNEE ARTHROPLASTY (Right) Past Medical History:  Diagnosis Date  . Anxiety   . Arthritis   . Back pain   . Diverticulosis   . IBS (irritable bowel syndrome)   . Osteopenia 12/21/2007  . Seizures ([place])    X 1 IN 2009   Active Problems:   Total knee replacement status  Estimated body mass index is 27.69 kg/m as calculated from the following:   Height as of this encounter: 5' 6.5" (1.689 m).   Weight as of this encounter: 79 kg. Advance diet Up with therapy D/C IV fluids Discharge home with home health Diet - Regular diet Follow up - in 2 weeks Activity - WBAT Disposition - Home Condition Upon Discharge - Stable DVT Prophylaxis -  Lovenox and TED hose  Reche Dixon, PA-C Orthopaedic Surgery 04/28/2019, 7:02 AM

## 2019-04-28 NOTE — Progress Notes (Signed)
Patient discharged from  Unit to home. Daughter provided transportation.  All d/c instructions reviewed with patient. Dressing to right knee changed prior to d/c as ordered. Incision well approximated, no drainage.   All personal belongings with  Patient.  Patient demonstrates no s/sx of distress.

## 2019-04-28 NOTE — Progress Notes (Signed)
  Subjective: 2 Days Post-Op Procedure(s) (LRB): COMPUTER ASSISTED TOTAL KNEE ARTHROPLASTY (Right) Patient reports pain as well controlled.   Patient is well, and has had no acute complaints or problems Plan is to go Home after hospital stay. Negative for chest pain and shortness of breath Fever: no Gastrointestinal:Negative for nausea and vomiting  Objective: Vital signs in last 24 hours: Temp:  [97.9 F (36.6 C)-98.6 F (37 C)] 97.9 F (36.6 C) (10/01 2332) Pulse Rate:  [72-84] 76 (10/01 2332) Resp:  [16-20] 20 (10/01 2332) BP: (124-131)/(62-74) 126/64 (10/01 2332) SpO2:  [97 %-100 %] 97 % (10/01 2332)  Intake/Output from previous day:  Intake/Output Summary (Last 24 hours) at 04/28/2019 0737 Last data filed at 04/28/2019 0519 Gross per 24 hour  Intake 2370 ml  Output 280 ml  Net 2090 ml    Intake/Output this shift: No intake/output data recorded.  Labs: No results for input(s): HGB in the last 72 hours. No results for input(s): WBC, RBC, HCT, PLT in the last 72 hours. No results for input(s): NA, K, CL, CO2, BUN, CREATININE, GLUCOSE, CALCIUM in the last 72 hours. No results for input(s): LABPT, INR in the last 72 hours.   EXAM General - Patient is Alert, Appropriate and Oriented Extremity - Neurovascular intact Dorsiflexion/Plantar flexion intact Compartment soft Dressing/Incision - dry, no drainage. Drain removed today. Motor Function - intact, moving foot and toes well on exam. Patient able to perform a straight leg raise    Assessment/Plan: 2 Days Post-Op Procedure(s) (LRB): COMPUTER ASSISTED TOTAL KNEE ARTHROPLASTY (Right) Active Problems:   Total knee replacement status  Estimated body mass index is 27.69 kg/m as calculated from the following:   Height as of this encounter: 5' 6.5" (1.689 m).   Weight as of this encounter: 79 kg. Advance diet Up with therapy Discharge home with home health  DVT Prophylaxis - Lovenox Weight-Bearing as tolerated to  right leg  Cassell Smiles, PA-C [facility] Orthopaedic Surgery 04/28/2019, 7:37 AM

## 2019-04-30 DIAGNOSIS — M5416 Radiculopathy, lumbar region: Secondary | ICD-10-CM | POA: Diagnosis not present

## 2019-04-30 DIAGNOSIS — Z471 Aftercare following joint replacement surgery: Secondary | ICD-10-CM | POA: Diagnosis not present

## 2019-04-30 DIAGNOSIS — K5790 Diverticulosis of intestine, part unspecified, without perforation or abscess without bleeding: Secondary | ICD-10-CM | POA: Diagnosis not present

## 2019-04-30 DIAGNOSIS — K589 Irritable bowel syndrome without diarrhea: Secondary | ICD-10-CM | POA: Diagnosis not present

## 2019-04-30 DIAGNOSIS — G40909 Epilepsy, unspecified, not intractable, without status epilepticus: Secondary | ICD-10-CM | POA: Diagnosis not present

## 2019-04-30 DIAGNOSIS — R69 Illness, unspecified: Secondary | ICD-10-CM | POA: Diagnosis not present

## 2019-04-30 DIAGNOSIS — Z7982 Long term (current) use of aspirin: Secondary | ICD-10-CM | POA: Diagnosis not present

## 2019-04-30 DIAGNOSIS — M47816 Spondylosis without myelopathy or radiculopathy, lumbar region: Secondary | ICD-10-CM | POA: Diagnosis not present

## 2019-04-30 DIAGNOSIS — M8580 Other specified disorders of bone density and structure, unspecified site: Secondary | ICD-10-CM | POA: Diagnosis not present

## 2019-04-30 DIAGNOSIS — G4700 Insomnia, unspecified: Secondary | ICD-10-CM | POA: Diagnosis not present

## 2019-05-01 DIAGNOSIS — Z7982 Long term (current) use of aspirin: Secondary | ICD-10-CM | POA: Diagnosis not present

## 2019-05-01 DIAGNOSIS — K589 Irritable bowel syndrome without diarrhea: Secondary | ICD-10-CM | POA: Diagnosis not present

## 2019-05-01 DIAGNOSIS — G40909 Epilepsy, unspecified, not intractable, without status epilepticus: Secondary | ICD-10-CM | POA: Diagnosis not present

## 2019-05-01 DIAGNOSIS — Z471 Aftercare following joint replacement surgery: Secondary | ICD-10-CM | POA: Diagnosis not present

## 2019-05-01 DIAGNOSIS — M47816 Spondylosis without myelopathy or radiculopathy, lumbar region: Secondary | ICD-10-CM | POA: Diagnosis not present

## 2019-05-01 DIAGNOSIS — M5416 Radiculopathy, lumbar region: Secondary | ICD-10-CM | POA: Diagnosis not present

## 2019-05-01 DIAGNOSIS — M8580 Other specified disorders of bone density and structure, unspecified site: Secondary | ICD-10-CM | POA: Diagnosis not present

## 2019-05-01 DIAGNOSIS — K5790 Diverticulosis of intestine, part unspecified, without perforation or abscess without bleeding: Secondary | ICD-10-CM | POA: Diagnosis not present

## 2019-05-01 DIAGNOSIS — R69 Illness, unspecified: Secondary | ICD-10-CM | POA: Diagnosis not present

## 2019-05-01 DIAGNOSIS — G4700 Insomnia, unspecified: Secondary | ICD-10-CM | POA: Diagnosis not present

## 2019-05-04 DIAGNOSIS — Z7982 Long term (current) use of aspirin: Secondary | ICD-10-CM | POA: Diagnosis not present

## 2019-05-04 DIAGNOSIS — M47816 Spondylosis without myelopathy or radiculopathy, lumbar region: Secondary | ICD-10-CM | POA: Diagnosis not present

## 2019-05-04 DIAGNOSIS — Z471 Aftercare following joint replacement surgery: Secondary | ICD-10-CM | POA: Diagnosis not present

## 2019-05-04 DIAGNOSIS — R69 Illness, unspecified: Secondary | ICD-10-CM | POA: Diagnosis not present

## 2019-05-04 DIAGNOSIS — M5416 Radiculopathy, lumbar region: Secondary | ICD-10-CM | POA: Diagnosis not present

## 2019-05-04 DIAGNOSIS — K5790 Diverticulosis of intestine, part unspecified, without perforation or abscess without bleeding: Secondary | ICD-10-CM | POA: Diagnosis not present

## 2019-05-04 DIAGNOSIS — G40909 Epilepsy, unspecified, not intractable, without status epilepticus: Secondary | ICD-10-CM | POA: Diagnosis not present

## 2019-05-04 DIAGNOSIS — M8580 Other specified disorders of bone density and structure, unspecified site: Secondary | ICD-10-CM | POA: Diagnosis not present

## 2019-05-04 DIAGNOSIS — G4700 Insomnia, unspecified: Secondary | ICD-10-CM | POA: Diagnosis not present

## 2019-05-04 DIAGNOSIS — K589 Irritable bowel syndrome without diarrhea: Secondary | ICD-10-CM | POA: Diagnosis not present

## 2019-05-05 DIAGNOSIS — Z471 Aftercare following joint replacement surgery: Secondary | ICD-10-CM | POA: Diagnosis not present

## 2019-05-08 DIAGNOSIS — M47816 Spondylosis without myelopathy or radiculopathy, lumbar region: Secondary | ICD-10-CM | POA: Diagnosis not present

## 2019-05-08 DIAGNOSIS — Z7982 Long term (current) use of aspirin: Secondary | ICD-10-CM | POA: Diagnosis not present

## 2019-05-08 DIAGNOSIS — G4700 Insomnia, unspecified: Secondary | ICD-10-CM | POA: Diagnosis not present

## 2019-05-08 DIAGNOSIS — G40909 Epilepsy, unspecified, not intractable, without status epilepticus: Secondary | ICD-10-CM | POA: Diagnosis not present

## 2019-05-08 DIAGNOSIS — M5416 Radiculopathy, lumbar region: Secondary | ICD-10-CM | POA: Diagnosis not present

## 2019-05-08 DIAGNOSIS — R69 Illness, unspecified: Secondary | ICD-10-CM | POA: Diagnosis not present

## 2019-05-08 DIAGNOSIS — K589 Irritable bowel syndrome without diarrhea: Secondary | ICD-10-CM | POA: Diagnosis not present

## 2019-05-08 DIAGNOSIS — Z471 Aftercare following joint replacement surgery: Secondary | ICD-10-CM | POA: Diagnosis not present

## 2019-05-08 DIAGNOSIS — M8580 Other specified disorders of bone density and structure, unspecified site: Secondary | ICD-10-CM | POA: Diagnosis not present

## 2019-05-08 DIAGNOSIS — K5790 Diverticulosis of intestine, part unspecified, without perforation or abscess without bleeding: Secondary | ICD-10-CM | POA: Diagnosis not present

## 2019-05-09 DIAGNOSIS — M47816 Spondylosis without myelopathy or radiculopathy, lumbar region: Secondary | ICD-10-CM | POA: Diagnosis not present

## 2019-05-09 DIAGNOSIS — Z471 Aftercare following joint replacement surgery: Secondary | ICD-10-CM | POA: Diagnosis not present

## 2019-05-09 DIAGNOSIS — R69 Illness, unspecified: Secondary | ICD-10-CM | POA: Diagnosis not present

## 2019-05-09 DIAGNOSIS — M5416 Radiculopathy, lumbar region: Secondary | ICD-10-CM | POA: Diagnosis not present

## 2019-05-09 DIAGNOSIS — M8580 Other specified disorders of bone density and structure, unspecified site: Secondary | ICD-10-CM | POA: Diagnosis not present

## 2019-05-09 DIAGNOSIS — G40909 Epilepsy, unspecified, not intractable, without status epilepticus: Secondary | ICD-10-CM | POA: Diagnosis not present

## 2019-05-09 DIAGNOSIS — G4700 Insomnia, unspecified: Secondary | ICD-10-CM | POA: Diagnosis not present

## 2019-05-09 DIAGNOSIS — K5790 Diverticulosis of intestine, part unspecified, without perforation or abscess without bleeding: Secondary | ICD-10-CM | POA: Diagnosis not present

## 2019-05-09 DIAGNOSIS — K589 Irritable bowel syndrome without diarrhea: Secondary | ICD-10-CM | POA: Diagnosis not present

## 2019-05-09 DIAGNOSIS — Z7982 Long term (current) use of aspirin: Secondary | ICD-10-CM | POA: Diagnosis not present

## 2019-05-10 DIAGNOSIS — M25661 Stiffness of right knee, not elsewhere classified: Secondary | ICD-10-CM | POA: Diagnosis not present

## 2019-05-10 DIAGNOSIS — Z96651 Presence of right artificial knee joint: Secondary | ICD-10-CM | POA: Diagnosis not present

## 2019-05-10 DIAGNOSIS — M25561 Pain in right knee: Secondary | ICD-10-CM | POA: Diagnosis not present

## 2019-05-10 DIAGNOSIS — R29898 Other symptoms and signs involving the musculoskeletal system: Secondary | ICD-10-CM | POA: Diagnosis not present

## 2019-05-15 DIAGNOSIS — Z96651 Presence of right artificial knee joint: Secondary | ICD-10-CM | POA: Diagnosis not present

## 2019-05-15 DIAGNOSIS — M25561 Pain in right knee: Secondary | ICD-10-CM | POA: Diagnosis not present

## 2019-05-17 DIAGNOSIS — Z96651 Presence of right artificial knee joint: Secondary | ICD-10-CM | POA: Diagnosis not present

## 2019-05-23 DIAGNOSIS — M25561 Pain in right knee: Secondary | ICD-10-CM | POA: Diagnosis not present

## 2019-05-23 DIAGNOSIS — Z96651 Presence of right artificial knee joint: Secondary | ICD-10-CM | POA: Diagnosis not present

## 2019-05-25 DIAGNOSIS — Z96651 Presence of right artificial knee joint: Secondary | ICD-10-CM | POA: Diagnosis not present

## 2019-05-25 DIAGNOSIS — M25561 Pain in right knee: Secondary | ICD-10-CM | POA: Diagnosis not present

## 2019-05-29 DIAGNOSIS — Z96651 Presence of right artificial knee joint: Secondary | ICD-10-CM | POA: Diagnosis not present

## 2019-05-31 DIAGNOSIS — Z96651 Presence of right artificial knee joint: Secondary | ICD-10-CM | POA: Diagnosis not present

## 2019-05-31 DIAGNOSIS — M25561 Pain in right knee: Secondary | ICD-10-CM | POA: Diagnosis not present

## 2019-06-05 DIAGNOSIS — Z96651 Presence of right artificial knee joint: Secondary | ICD-10-CM | POA: Diagnosis not present

## 2019-06-08 DIAGNOSIS — M1711 Unilateral primary osteoarthritis, right knee: Secondary | ICD-10-CM | POA: Diagnosis not present

## 2019-07-13 DIAGNOSIS — R69 Illness, unspecified: Secondary | ICD-10-CM | POA: Diagnosis not present

## 2019-07-19 DIAGNOSIS — E782 Mixed hyperlipidemia: Secondary | ICD-10-CM | POA: Diagnosis not present

## 2019-07-19 DIAGNOSIS — Z03818 Encounter for observation for suspected exposure to other biological agents ruled out: Secondary | ICD-10-CM | POA: Diagnosis not present

## 2019-07-19 DIAGNOSIS — E538 Deficiency of other specified B group vitamins: Secondary | ICD-10-CM | POA: Diagnosis not present

## 2019-08-16 DIAGNOSIS — E559 Vitamin D deficiency, unspecified: Secondary | ICD-10-CM | POA: Insufficient documentation

## 2019-08-29 ENCOUNTER — Other Ambulatory Visit: Payer: Self-pay | Admitting: Internal Medicine

## 2019-08-29 DIAGNOSIS — Z1231 Encounter for screening mammogram for malignant neoplasm of breast: Secondary | ICD-10-CM

## 2019-09-05 ENCOUNTER — Other Ambulatory Visit: Payer: Self-pay

## 2019-09-05 ENCOUNTER — Ambulatory Visit
Admission: RE | Admit: 2019-09-05 | Discharge: 2019-09-05 | Disposition: A | Discharge status: Home / Self Care | Payer: Medicare HMO | Source: Ambulatory Visit | Attending: Internal Medicine | Admitting: Internal Medicine

## 2019-09-05 DIAGNOSIS — Z1231 Encounter for screening mammogram for malignant neoplasm of breast: Secondary | ICD-10-CM | POA: Diagnosis not present

## 2019-11-21 IMAGING — DX DG CHEST 2V
2 series · 2 of 2 positions shown · non-contrast
Comparison: None.

CLINICAL DATA: Smoker with a productive cough for the past month.

EXAM:
CHEST - 2 VIEW

[chest pa]
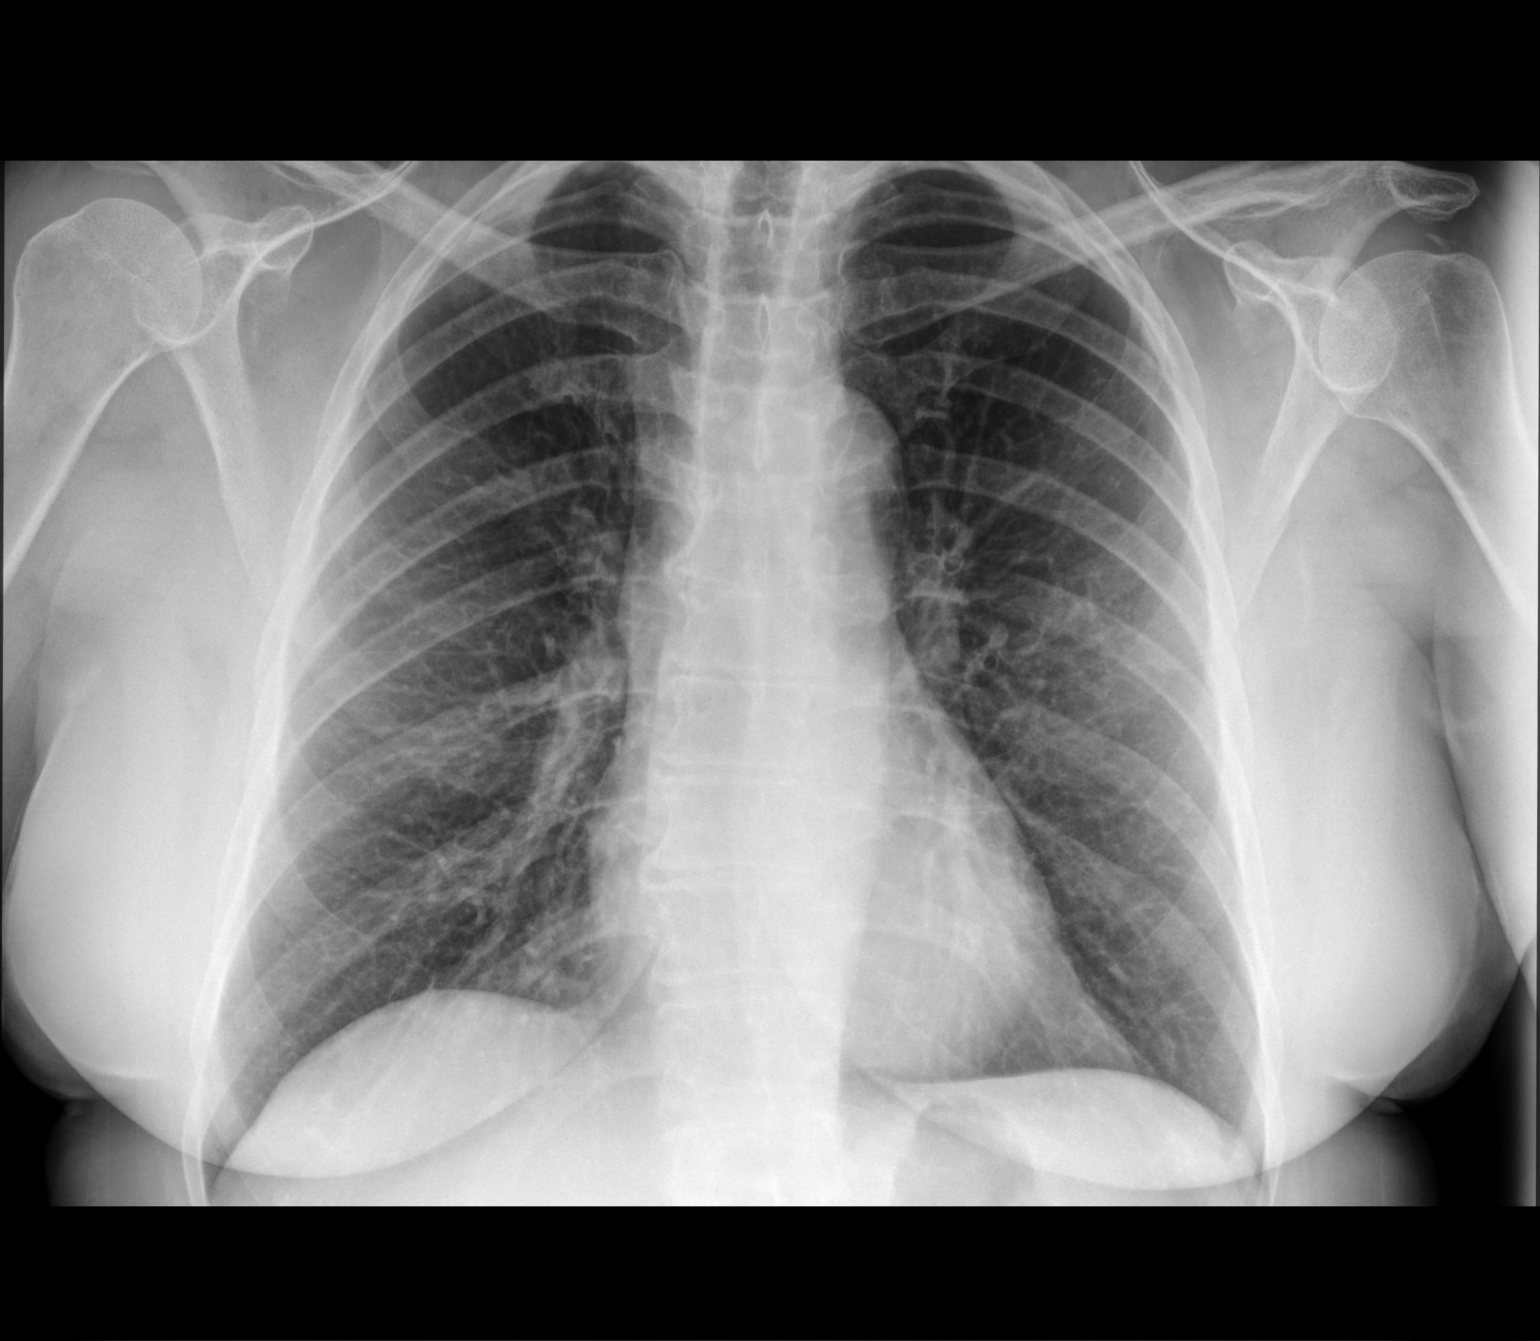

[chest lat]
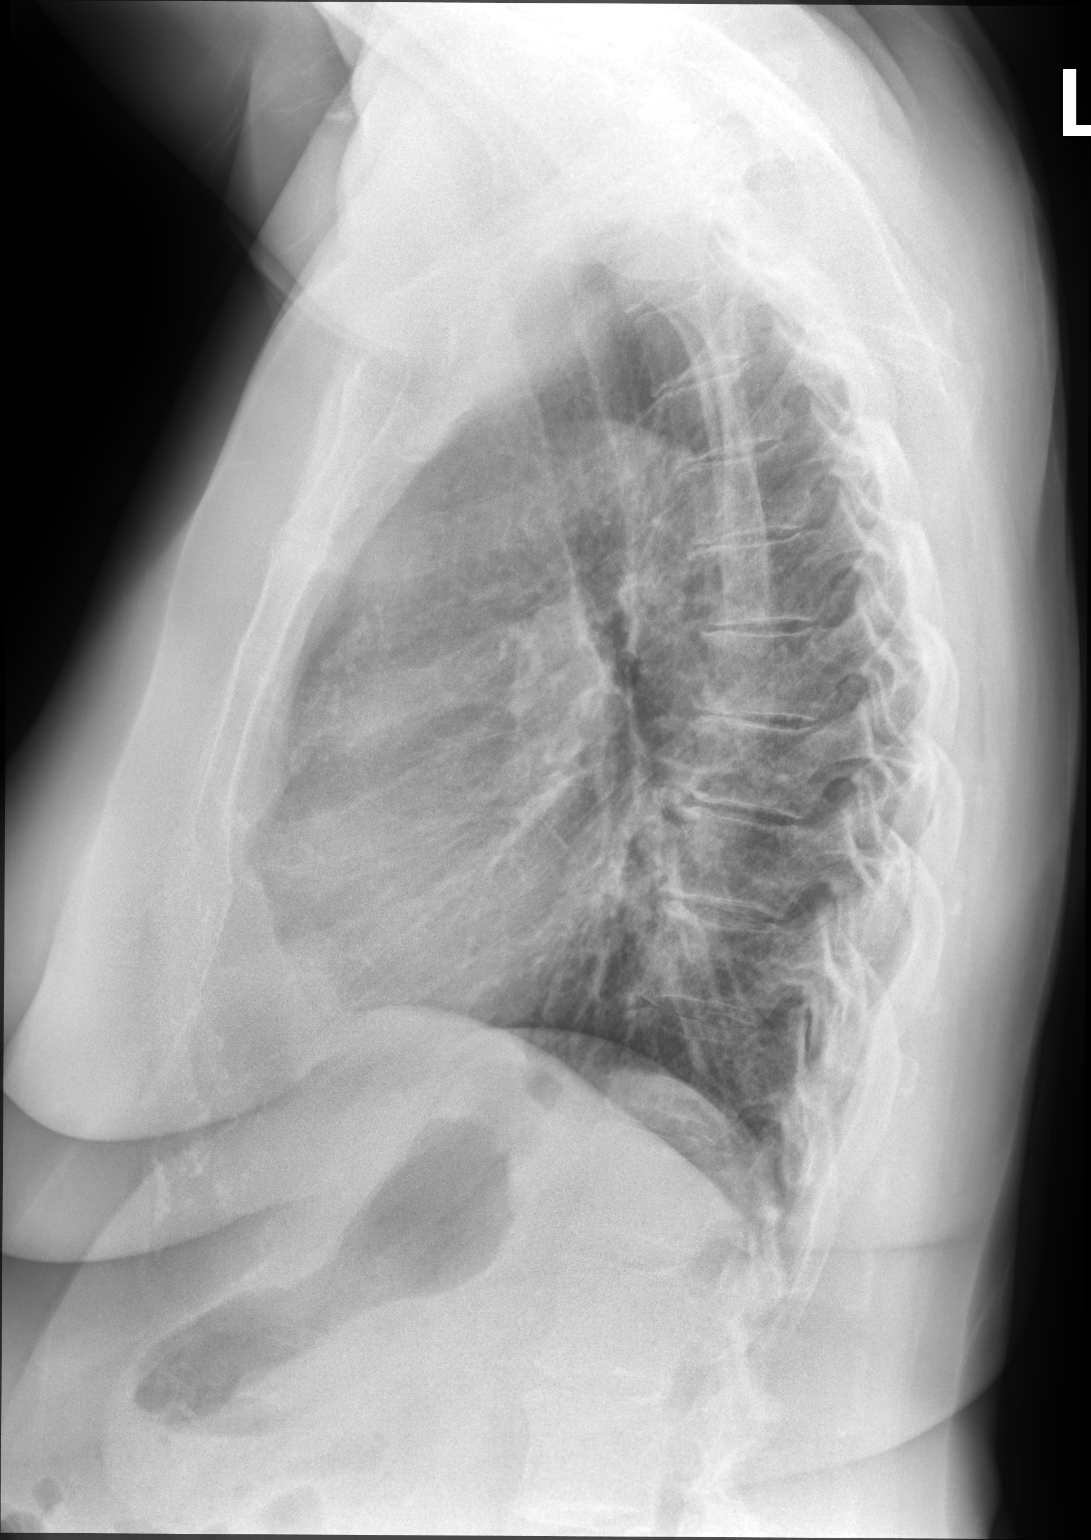

[2 of 2 positions shown; findings below may reference images not displayed]

FINDINGS: Normal sized heart. Clear lungs. Mild peribronchial thickening. Mild
thoracic spine degenerative changes and mild scoliosis.
IMPRESSION: Mild bronchitic changes.

## 2020-03-26 HISTORY — PX: COLONOSCOPY WITH ESOPHAGOGASTRODUODENOSCOPY (EGD): SHX5779

## 2020-03-26 SURGERY — COLONOSCOPY WITH PROPOFOL
Anesthesia: General

## 2020-09-04 DIAGNOSIS — D369 Benign neoplasm, unspecified site: Secondary | ICD-10-CM | POA: Insufficient documentation

## 2020-11-11 ENCOUNTER — Other Ambulatory Visit: Payer: Self-pay

## 2020-11-11 ENCOUNTER — Encounter: Payer: Self-pay | Admitting: Ophthalmology

## 2020-11-14 NOTE — Discharge Instructions (Signed)

## 2020-11-18 ENCOUNTER — Ambulatory Visit: Payer: Medicare HMO | Admitting: Anesthesiology

## 2020-11-18 ENCOUNTER — Other Ambulatory Visit: Payer: Self-pay

## 2020-11-18 ENCOUNTER — Encounter: Payer: Self-pay | Admitting: Ophthalmology

## 2020-11-18 ENCOUNTER — Encounter
Admission: RE | Disposition: A | Discharge status: Home / Self Care | Payer: Self-pay | Source: Home / Self Care | Attending: Ophthalmology

## 2020-11-18 ENCOUNTER — Ambulatory Visit
Admission: RE | Admit: 2020-11-18 | Discharge: 2020-11-18 | Disposition: A | Discharge status: Home / Self Care | Payer: Medicare HMO | Attending: Ophthalmology | Admitting: Ophthalmology

## 2020-11-18 DIAGNOSIS — H2511 Age-related nuclear cataract, right eye: Secondary | ICD-10-CM | POA: Insufficient documentation

## 2020-11-18 DIAGNOSIS — Z880 Allergy status to penicillin: Secondary | ICD-10-CM | POA: Diagnosis not present

## 2020-11-18 DIAGNOSIS — Z79899 Other long term (current) drug therapy: Secondary | ICD-10-CM | POA: Diagnosis not present

## 2020-11-18 HISTORY — PX: CATARACT EXTRACTION W/PHACO: SHX586

## 2020-11-18 SURGERY — PHACOEMULSIFICATION, CATARACT, WITH IOL INSERTION
Anesthesia: Monitor Anesthesia Care | Site: Eye | Laterality: Right

## 2020-11-18 MED ORDER — SODIUM HYALURONATE 10 MG/ML IO SOLUTION
PREFILLED_SYRINGE | INTRAOCULAR | Status: DC | PRN
  Administered 2020-11-18: 0.55 mL via INTRAOCULAR

## 2020-11-18 MED ORDER — TETRACAINE HCL 0.5 % OP SOLN
1.0000 [drp] | OPHTHALMIC | Status: DC | PRN
  Administered 2020-11-18 (×3): 1 [drp] via OPHTHALMIC

## 2020-11-18 MED ORDER — LIDOCAINE HCL (PF) 2 % IJ SOLN
INTRAOCULAR | Status: DC | PRN
  Administered 2020-11-18: 1 mL via INTRAOCULAR

## 2020-11-18 MED ORDER — MOXIFLOXACIN HCL 0.5 % OP SOLN
OPHTHALMIC | Status: DC | PRN
  Administered 2020-11-18: 0.2 mL via OPHTHALMIC

## 2020-11-18 MED ORDER — SODIUM HYALURONATE 23MG/ML IO SOSY
PREFILLED_SYRINGE | INTRAOCULAR | Status: DC | PRN
  Administered 2020-11-18: 0.6 mL via INTRAOCULAR

## 2020-11-18 MED ORDER — EPINEPHRINE PF 1 MG/ML IJ SOLN
INTRAOCULAR | Status: DC | PRN
  Administered 2020-11-18: 89 mL via OPHTHALMIC

## 2020-11-18 MED ORDER — ARMC OPHTHALMIC DILATING DROPS
1.0000 "application " | OPHTHALMIC | Status: DC | PRN
  Administered 2020-11-18 (×3): 1 via OPHTHALMIC

## 2020-11-18 MED ORDER — LACTATED RINGERS IV SOLN: INTRAVENOUS | Status: DC

## 2020-11-18 MED ORDER — MIDAZOLAM HCL 2 MG/2ML IJ SOLN
INTRAMUSCULAR | Status: DC | PRN
  Administered 2020-11-18: 2 mg via INTRAVENOUS

## 2020-11-18 MED ORDER — FENTANYL CITRATE (PF) 100 MCG/2ML IJ SOLN
INTRAMUSCULAR | Status: DC | PRN
  Administered 2020-11-18 (×2): 50 ug via INTRAVENOUS

## 2020-11-18 SURGICAL SUPPLY — 19 items
CANNULA ANT/CHMB 27GA (MISCELLANEOUS) ×4 IMPLANT
DISSECTOR HYDRO NUCLEUS 50X22 (MISCELLANEOUS) ×2 IMPLANT
FORCEPS MICRO-HOLDING 23GA (INSTRUMENTS) ×2 IMPLANT
GLOVE PI ULTRA LF STRL 7.5 (GLOVE) ×1 IMPLANT
GLOVE PI ULTRA NON LATEX 7.5 (GLOVE) ×1
GLOVE SURG SYN 8.5  E (GLOVE) ×2
GLOVE SURG SYN 8.5 E (GLOVE) ×1 IMPLANT
GOWN STRL REUS W/ TWL LRG LVL3 (GOWN DISPOSABLE) ×2 IMPLANT
GOWN STRL REUS W/TWL LRG LVL3 (GOWN DISPOSABLE) ×4
LENS IOL ACRSF IQ PAN 22.5 ×2 IMPLANT
LENS IOL IQ PANOPTIX 22.5 ×4 IMPLANT
MARKER SKIN DUAL TIP RULER LAB (MISCELLANEOUS) ×2 IMPLANT
PACK DR. KING ARMS (PACKS) ×2 IMPLANT
PACK EYE AFTER SURG (MISCELLANEOUS) ×2 IMPLANT
PACK OPTHALMIC (MISCELLANEOUS) ×2 IMPLANT
SYR 3ML LL SCALE MARK (SYRINGE) ×2 IMPLANT
SYR TB 1ML LUER SLIP (SYRINGE) ×2 IMPLANT
WATER STERILE IRR 250ML POUR (IV SOLUTION) ×2 IMPLANT
WIPE NON LINTING 3.25X3.25 (MISCELLANEOUS) ×2 IMPLANT

## 2020-11-18 NOTE — Transfer of Care (Signed)
Immediate Anesthesia Transfer of Care Note  Patient: Lisa Yu  Procedure(s) Performed: CATARACT EXTRACTION PHACO AND INTRAOCULAR LENS PLACEMENT (IOC) RIGHT PANOPTIX LENS 3.49 00:29.8 (Right Eye)  Patient Location: PACU  Anesthesia Type: MAC  Level of Consciousness: awake, alert  and patient cooperative  Airway and Oxygen Therapy: Patient Spontanous Breathing and Patient connected to supplemental oxygen  Post-op Assessment: Post-op Vital signs reviewed, Patient's Cardiovascular Status Stable, Respiratory Function Stable, Patent Airway and No signs of Nausea or vomiting  Post-op Vital Signs: Reviewed and stable  Complications: No complications documented.

## 2020-11-18 NOTE — Anesthesia Procedure Notes (Addendum)
Procedure Name: MAC Date/Time: 11/18/2020 8:02 AM Performed by: Jeannene Patella, CRNA Pre-anesthesia Checklist: Patient identified, Emergency Drugs available, Suction available, Timeout performed and Patient being monitored Patient Re-evaluated:Patient Re-evaluated prior to induction Oxygen Delivery Method: Nasal cannula Placement Confirmation: positive ETCO2

## 2020-11-18 NOTE — H&P (Signed)
[place]   Primary Care Physician:  Rusty Aus, MD Ophthalmologist: Dr. Benay Pillow  Pre-Procedure History & Physical: HPI:  Lisa Yu is a 70 y.o. female here for cataract surgery.   Past Medical History:  Diagnosis Date  . Anxiety   . Arthritis   . Back pain   . Diverticulosis   . IBS (irritable bowel syndrome)   . Osteopenia 12/21/2007  . Seizures ([place])    X 1 IN 2009    Past Surgical History:  Procedure Laterality Date  . AUGMENTATION MAMMAPLASTY Bilateral 1998   removed 2013  . BREAST SURGERY  1983   bilateral implants  . cervical disease  1999   H/O Cryo  . CHONDROPLASTY Right 05/13/2016   Procedure: CHONDROPLASTY;  Surgeon: Dereck Leep, MD;  Location: [facility] ORS;  Service: Orthopedics;  Laterality: Right;  . COLONOSCOPY  05/01/2005  . FOOT SURGERY Bilateral   . KNEE ARTHROPLASTY Right 04/26/2019   Procedure: COMPUTER ASSISTED TOTAL KNEE ARTHROPLASTY;  Surgeon: Dereck Leep, MD;  Location: [facility] ORS;  Service: Orthopedics;  Laterality: Right;  . KNEE ARTHROSCOPY Right 05/13/2016   Procedure: ARTHROSCOPY KNEE WITH MEDIAL AND LATERAL MENISECTOMY;  Surgeon: Dereck Leep, MD;  Location: [facility] ORS;  Service: Orthopedics;  Laterality: Right;  . LASIK  2007  . MOUTH SURGERY     DENTAL IMPLANTS (TOP)  . VAGINAL DELIVERY     x 1    Prior to Admission medications   Medication Sig Start Date End Date Taking? Authorizing Provider  acetaminophen (TYLENOL) 500 MG tablet Take 500 mg by mouth every 6 (six) hours as needed.   Yes [provider]  Ascorbic Acid (VITAMIN C PO) Take by mouth daily.   Yes [provider]  CALCIUM PO Take by mouth daily.   Yes [provider]  clonazePAM (KLONOPIN) 0.5 MG tablet Take 0.5 mg by mouth at bedtime.   Yes [provider]  hyoscyamine (LEVSIN) 0.125 MG tablet Take 1 tablet (0.125 mg total) by mouth every 4 (four) hours as needed. Patient taking differently: Take 0.125 mg by  mouth every 4 (four) hours as needed for cramping. 11/29/17  Yes Lucille Passy, MD  lamoTRIgine (LAMICTAL) 200 MG tablet Take 1 tablet (200 mg total) by mouth 2 (two) times daily. 05/24/17  Yes Dennie Bible, NP  Multiple Vitamin (MULTIVITAMIN) tablet Take 1 tablet by mouth daily.   Yes [provider]  Vitamin D, Cholecalciferol, 50 MCG (2000 UT) CAPS Take 2,000 Units by mouth daily.   Yes [provider]  VITAMIN E PO Take by mouth daily.   Yes [provider]  aspirin 81 MG tablet Take 81 mg by mouth daily.   Patient not taking: Reported on 11/11/2020    [provider]  fluticasone (FLONASE) 50 MCG/ACT nasal spray Place 2 sprays into both nostrils daily as needed. Reported on 12/27/2015 Patient not taking: No sig reported 10/27/17   Lucille Passy, MD    Allergies as of 10/04/2020 - Review Complete 04/26/2019  Allergen Reaction Noted  . Other Itching and Rash 04/23/2016  . Penicillins      Family History  Problem Relation Age of Onset  . Cancer Mother        skin  . Breast cancer Neg Hx     Social History   Socioeconomic History  . Marital status: Married    Spouse name: Ernie Hew  . Number of children: 1  . Years  of education: 12 th  . Highest education level: Not on file  Occupational History  . Occupation: Retired from [employer]  . Smoking status: Never Smoker  . Smokeless tobacco: Never Used  Vaping Use  . Vaping Use: Never used  Substance and Sexual Activity  . Alcohol use: Yes    Alcohol/week: 1.0 standard drink    Types: 1 Standard drinks or equivalent per week  . Drug use: No  . Sexual activity: Not on file  Other Topics Concern  . Not on file  Social History Narrative   Patient lives at home with her husband Ernie Hew).   Retired and she is self employed.   Education 12 th    Right handed.   Caffeine two cups of coffee one soda daily.      Would desire CPR, no prolonged life support if futile.   Social  Determinants of Health   Financial Resource Strain: Not on file  Food Insecurity: Not on file  Transportation Needs: Not on file  Physical Activity: Not on file  Stress: Not on file  Social Connections: Not on file  Intimate Partner Violence: Not on file    Review of Systems: See HPI, otherwise negative ROS  Physical Exam: BP (!) 161/87   Pulse 72   Temp 97.8 F (36.6 C) (Temporal)   Resp 16   Ht 5\' 5"  (1.651 m)   Wt 83.5 kg   SpO2 97%   BMI 30.62 kg/m  General:   Alert,  pleasant and cooperative in NAD Head:  Normocephalic and atraumatic. Respiratory:  Normal work of breathing. Cardiovascular:  RRR  Impression/Plan: Lisa Yu is here for cataract surgery.  Risks, benefits, limitations, and alternatives regarding cataract surgery have been reviewed with the patient.  Questions have been answered.  All parties agreeable.   Benay Pillow, MD  11/18/2020, 7:51 AM

## 2020-11-18 NOTE — Anesthesia Postprocedure Evaluation (Signed)
Anesthesia Post Note  Patient: Lisa Yu  Procedure(s) Performed: CATARACT EXTRACTION PHACO AND INTRAOCULAR LENS PLACEMENT (IOC) RIGHT PANOPTIX LENS 3.49 00:29.8 (Right Eye)     Patient location during evaluation: PACU Anesthesia Type: MAC Level of consciousness: awake and alert Pain management: pain level controlled Vital Signs Assessment: post-procedure vital signs reviewed and stable Respiratory status: spontaneous breathing, nonlabored ventilation, respiratory function stable and patient connected to nasal cannula oxygen Cardiovascular status: stable and blood pressure returned to baseline Postop Assessment: no apparent nausea or vomiting Anesthetic complications: no   No complications documented.  Alisa Graff

## 2020-11-18 NOTE — Anesthesia Preprocedure Evaluation (Signed)
Anesthesia Evaluation  Patient identified by MRN, date of birth, ID band Patient awake    Reviewed: Allergy & Precautions, H&P , NPO status , Patient's Chart, lab work & pertinent test results, reviewed documented beta blocker date and time   Airway Mallampati: II  TM Distance: >3 FB Neck ROM: full    Dental no notable dental hx.    Pulmonary neg pulmonary ROS,    Pulmonary exam normal breath sounds clear to auscultation       Cardiovascular Exercise Tolerance: Good negative cardio ROS   Rhythm:regular Rate:Normal     Neuro/Psych Seizures - (x1 2009),  Anxiety    GI/Hepatic Neg liver ROS, IBS   Endo/Other  negative endocrine ROS  Renal/GU negative Renal ROS  negative genitourinary   Musculoskeletal   Abdominal   Peds  Hematology negative hematology ROS (+)   Anesthesia Other Findings   Reproductive/Obstetrics negative OB ROS                             Anesthesia Physical Anesthesia Plan  ASA: II  Anesthesia Plan: MAC   Post-op Pain Management:    Induction:   PONV Risk Score and Plan: 2 and Treatment may vary due to age or medical condition  Airway Management Planned:   Additional Equipment:   Intra-op Plan:   Post-operative Plan:   Informed Consent: I have reviewed the patients History and Physical, chart, labs and discussed the procedure including the risks, benefits and alternatives for the proposed anesthesia with the patient or authorized representative who has indicated his/her understanding and acceptance.     Dental Advisory Given  Plan Discussed with: CRNA  Anesthesia Plan Comments:         Anesthesia Quick Evaluation

## 2020-11-18 NOTE — Op Note (Signed)
OPERATIVE NOTE  Lisa Yu [id number] 11/18/2020   PREOPERATIVE DIAGNOSIS:  Nuclear sclerotic cataract right eye.  H25.11   POSTOPERATIVE DIAGNOSIS:    Nuclear sclerotic cataract right eye.     PROCEDURE:  Phacoemusification with posterior chamber intraocular lens placement of the right eye   LENS:   Implant Name Type Inv. Item Serial No. Manufacturer Lot No. LRB No. Used Action  LENS IOL IQ PANOPTIX 22.5 - [id number]  LENS IOL IQ PANOPTIX 22.5 [id number] ALCON  Right 1 Implanted and Explanted  LENS IOL IQ PANOPTIX 22.5 - [id number]  LENS IOL IQ PANOPTIX 22.5 [id number] ALCON  Right 1 Implanted       Procedure(s): CATARACT EXTRACTION PHACO AND INTRAOCULAR LENS PLACEMENT (IOC) RIGHT PANOPTIX LENS 3.49 00:29.8 (Right)  TFNT00 +22.5   ULTRASOUND TIME: 0 minutes 29 seconds.  CDE 3.49   SURGEON:  Benay Pillow, MD, MPH  ANESTHESIOLOGIST: Anesthesiologist: Alisa Graff, MD CRNA: Jeannene Patella, CRNA   ANESTHESIA:  Topical with tetracaine drops augmented with 1% preservative-free intracameral lidocaine.  ESTIMATED BLOOD LOSS: less than 1 mL.   COMPLICATIONS:  None.   DESCRIPTION OF PROCEDURE:  The patient was identified in the holding room and transported to the operating room and placed in the supine position under the operating microscope.  The right eye was identified as the operative eye and it was prepped and draped in the usual sterile ophthalmic fashion.   A 1.0 millimeter clear-corneal paracentesis was made at the 10:30 position. 0.5 ml of preservative-free 1% lidocaine with epinephrine was injected into the anterior chamber.  The anterior chamber was filled with Healon 5 viscoelastic.  A 2.4 millimeter keratome was used to make a near-clear corneal incision at the 8:00 position.  A curvilinear capsulorrhexis was made with a cystotome and capsulorrhexis forceps.  Balanced salt solution was used to hydrodissect and hydrodelineate the nucleus.    Phacoemulsification was then used in stop and chop fashion to remove the lens nucleus and epinucleus.  The remaining cortex was then removed using the irrigation and aspiration handpiece. Healon was then placed into the capsular bag to distend it for lens placement.  A lens was then injected into the capsular bag.    A Monarch III injector and D cartridge was used to inject the panoptix lens.  The lens was torn at the optic/haptic junction (still connected, but 90% sheared).    MST chang IOL cutters and microforceps were used to cut the lens into 3 pieces which were removed without difficulty.  The backup panoptix lens was injected.  Again, the lens was damaged at the optic/haptic junction but appeared stable and remained centered even when all viscoelastic was aspirated.   Wounds were hydrated with balanced salt solution.  The anterior chamber was inflated to a physiologic pressure with balanced salt solution.   Intracameral vigamox 0.1 mL undiluted was injected into the eye and a drop placed onto the ocular surface.  No wound leaks were noted.  The patient was taken to the recovery room in stable condition without complications of anesthesia or surgery  Benay Pillow 11/18/2020, 8:32 AM

## 2020-11-19 DIAGNOSIS — H2511 Age-related nuclear cataract, right eye: Secondary | ICD-10-CM | POA: Diagnosis not present

## 2020-11-28 NOTE — Discharge Instructions (Signed)

## 2020-11-29 NOTE — Anesthesia Preprocedure Evaluation (Addendum)
Anesthesia Evaluation  Patient identified by MRN, date of birth, ID band Patient awake    Reviewed: Allergy & Precautions, NPO status , Patient's Chart, lab work & pertinent test results  History of Anesthesia Complications Negative for: history of anesthetic complications  Airway Mallampati: I   Neck ROM: Full    Dental  (+) Implants   Pulmonary neg pulmonary ROS,    Pulmonary exam normal breath sounds clear to auscultation       Cardiovascular Exercise Tolerance: Good negative cardio ROS Normal cardiovascular exam Rhythm:Regular Rate:Normal     Neuro/Psych Seizures - (x1, 2009), Well Controlled,  PSYCHIATRIC DISORDERS Anxiety    GI/Hepatic negative GI ROS,   Endo/Other  negative endocrine ROS  Renal/GU negative Renal ROS     Musculoskeletal  (+) Arthritis ,   Abdominal   Peds  Hematology Skin BCC   Anesthesia Other Findings   Reproductive/Obstetrics                            Anesthesia Physical Anesthesia Plan  ASA: II  Anesthesia Plan: MAC   Post-op Pain Management:    Induction: Intravenous  PONV Risk Score and Plan: 2 and TIVA, Midazolam and Treatment may vary due to age or medical condition  Airway Management Planned: Nasal Cannula  Additional Equipment:   Intra-op Plan:   Post-operative Plan:   Informed Consent: I have reviewed the patients History and Physical, chart, labs and discussed the procedure including the risks, benefits and alternatives for the proposed anesthesia with the patient or authorized representative who has indicated his/her understanding and acceptance.       Plan Discussed with: CRNA  Anesthesia Plan Comments:        Anesthesia Quick Evaluation

## 2020-12-02 ENCOUNTER — Ambulatory Visit: Payer: Medicare HMO | Admitting: Anesthesiology

## 2020-12-02 ENCOUNTER — Ambulatory Visit
Admission: RE | Admit: 2020-12-02 | Discharge: 2020-12-02 | Disposition: A | Discharge status: Home / Self Care | Payer: Medicare HMO | Attending: Ophthalmology | Admitting: Ophthalmology

## 2020-12-02 ENCOUNTER — Other Ambulatory Visit: Payer: Self-pay

## 2020-12-02 ENCOUNTER — Encounter: Payer: Self-pay | Admitting: Ophthalmology

## 2020-12-02 ENCOUNTER — Encounter
Admission: RE | Disposition: A | Discharge status: Home / Self Care | Payer: Self-pay | Source: Home / Self Care | Attending: Ophthalmology

## 2020-12-02 DIAGNOSIS — Z880 Allergy status to penicillin: Secondary | ICD-10-CM | POA: Insufficient documentation

## 2020-12-02 DIAGNOSIS — H2512 Age-related nuclear cataract, left eye: Secondary | ICD-10-CM | POA: Diagnosis present

## 2020-12-02 DIAGNOSIS — Z79899 Other long term (current) drug therapy: Secondary | ICD-10-CM | POA: Insufficient documentation

## 2020-12-02 DIAGNOSIS — Z8679 Personal history of other diseases of the circulatory system: Secondary | ICD-10-CM | POA: Insufficient documentation

## 2020-12-02 HISTORY — PX: CATARACT EXTRACTION W/PHACO: SHX586

## 2020-12-02 SURGERY — PHACOEMULSIFICATION, CATARACT, WITH IOL INSERTION
Anesthesia: Monitor Anesthesia Care | Site: Eye | Laterality: Left

## 2020-12-02 MED ORDER — LACTATED RINGERS IV SOLN: INTRAVENOUS | Status: DC

## 2020-12-02 MED ORDER — SODIUM HYALURONATE 10 MG/ML IO SOLUTION
PREFILLED_SYRINGE | INTRAOCULAR | Status: DC | PRN
  Administered 2020-12-02: 0.55 mL via INTRAOCULAR

## 2020-12-02 MED ORDER — MIDAZOLAM HCL 2 MG/2ML IJ SOLN
INTRAMUSCULAR | Status: DC | PRN
  Administered 2020-12-02: 1 mg via INTRAVENOUS

## 2020-12-02 MED ORDER — EPINEPHRINE PF 1 MG/ML IJ SOLN
INTRAOCULAR | Status: DC | PRN
  Administered 2020-12-02: 76 mL via OPHTHALMIC

## 2020-12-02 MED ORDER — SODIUM HYALURONATE 23MG/ML IO SOSY
PREFILLED_SYRINGE | INTRAOCULAR | Status: DC | PRN
  Administered 2020-12-02: 0.6 mL via INTRAOCULAR

## 2020-12-02 MED ORDER — LIDOCAINE HCL (PF) 2 % IJ SOLN
INTRAOCULAR | Status: DC | PRN
  Administered 2020-12-02: 1 mL via INTRAOCULAR

## 2020-12-02 MED ORDER — ARMC OPHTHALMIC DILATING DROPS
1.0000 "application " | OPHTHALMIC | Status: DC | PRN
  Administered 2020-12-02 (×3): 1 via OPHTHALMIC

## 2020-12-02 MED ORDER — FENTANYL CITRATE (PF) 100 MCG/2ML IJ SOLN
INTRAMUSCULAR | Status: DC | PRN
  Administered 2020-12-02: 50 ug via INTRAVENOUS

## 2020-12-02 MED ORDER — MOXIFLOXACIN HCL 0.5 % OP SOLN
OPHTHALMIC | Status: DC | PRN
  Administered 2020-12-02: 0.2 mL via OPHTHALMIC

## 2020-12-02 MED ORDER — TETRACAINE HCL 0.5 % OP SOLN
1.0000 [drp] | OPHTHALMIC | Status: DC | PRN
  Administered 2020-12-02 (×3): 1 [drp] via OPHTHALMIC

## 2020-12-02 SURGICAL SUPPLY — 18 items
CANNULA ANT/CHMB 27GA (MISCELLANEOUS) ×4 IMPLANT
DISSECTOR HYDRO NUCLEUS 50X22 (MISCELLANEOUS) ×2 IMPLANT
GLOVE PI ULTRA LF STRL 7.5 (GLOVE) ×1 IMPLANT
GLOVE PI ULTRA NON LATEX 7.5 (GLOVE) ×1
GLOVE SURG SYN 8.5  E (GLOVE) ×2
GLOVE SURG SYN 8.5 E (GLOVE) ×1 IMPLANT
GOWN STRL REUS W/ TWL LRG LVL3 (GOWN DISPOSABLE) ×2 IMPLANT
GOWN STRL REUS W/TWL LRG LVL3 (GOWN DISPOSABLE) ×4
LENS IOL ACRSF IQ PAN 19.0 ×1 IMPLANT
LENS IOL IQ PANOPTIX 19.0 ×2 IMPLANT
MARKER SKIN DUAL TIP RULER LAB (MISCELLANEOUS) ×2 IMPLANT
PACK DR. KING ARMS (PACKS) ×2 IMPLANT
PACK EYE AFTER SURG (MISCELLANEOUS) ×2 IMPLANT
PACK OPTHALMIC (MISCELLANEOUS) ×2 IMPLANT
SYR 3ML LL SCALE MARK (SYRINGE) ×2 IMPLANT
SYR TB 1ML LUER SLIP (SYRINGE) ×2 IMPLANT
WATER STERILE IRR 250ML POUR (IV SOLUTION) ×2 IMPLANT
WIPE NON LINTING 3.25X3.25 (MISCELLANEOUS) ×2 IMPLANT

## 2020-12-02 NOTE — Anesthesia Postprocedure Evaluation (Signed)
Anesthesia Post Note  Patient: Lisa Yu  Procedure(s) Performed: CATARACT EXTRACTION PHACO AND INTRAOCULAR LENS PLACEMENT (IOC) LEFT (Left Eye)     Patient location during evaluation: PACU Anesthesia Type: MAC Level of consciousness: awake and alert, oriented and patient cooperative Pain management: pain level controlled Vital Signs Assessment: post-procedure vital signs reviewed and stable Respiratory status: spontaneous breathing, nonlabored ventilation and respiratory function stable Cardiovascular status: blood pressure returned to baseline and stable Postop Assessment: adequate PO intake Anesthetic complications: no   No complications documented.  Darrin Nipper

## 2020-12-02 NOTE — Op Note (Signed)
OPERATIVE NOTE  Lisa Yu [id number] 12/02/2020   PREOPERATIVE DIAGNOSIS:  Nuclear sclerotic cataract left eye.  H25.12   POSTOPERATIVE DIAGNOSIS:    Nuclear sclerotic cataract left eye.     PROCEDURE:  Phacoemusification with posterior chamber intraocular lens placement of the left eye   LENS:   Implant Name Type Inv. Item Serial No. Manufacturer Lot No. LRB No. Used Action  LENS IOL IQ PANOPTIX 19.0 - [id number]  LENS IOL IQ PANOPTIX 19.0 [id number] ALCON  Left 1 Implanted      Procedure(s) with comments: CATARACT EXTRACTION PHACO AND INTRAOCULAR LENS PLACEMENT (IOC) LEFT (Left) - 2.69 00:23.4  TFNT00 +19.0   ULTRASOUND TIME: 0 minutes 23 seconds.  CDE 2.69   SURGEON:  Benay Pillow, MD, MPH   ANESTHESIA:  Topical with tetracaine drops augmented with 1% preservative-free intracameral lidocaine.  ESTIMATED BLOOD LOSS: <1 mL   COMPLICATIONS:  None.   DESCRIPTION OF PROCEDURE:  The patient was identified in the holding room and transported to the operating room and placed in the supine position under the operating microscope.  The left eye was identified as the operative eye and it was prepped and draped in the usual sterile ophthalmic fashion.   A 1.0 millimeter clear-corneal paracentesis was made at the 5:00 position. 0.5 ml of preservative-free 1% lidocaine with epinephrine was injected into the anterior chamber.  The anterior chamber was filled with Healon 5 viscoelastic.  A 2.4 millimeter keratome was used to make a near-clear corneal incision at the 2:00 position.  A curvilinear capsulorrhexis was made with a cystotome and capsulorrhexis forceps.  Balanced salt solution was used to hydrodissect and hydrodelineate the nucleus.   Phacoemulsification was then used in stop and chop fashion to remove the lens nucleus and epinucleus.  The remaining cortex was then removed using the irrigation and aspiration handpiece. Healon was then placed into the capsular bag to distend  it for lens placement.  A lens was then injected into the capsular bag.  The remaining viscoelastic was aspirated.   Wounds were hydrated with balanced salt solution.  The anterior chamber was inflated to a physiologic pressure with balanced salt solution.  Intracameral vigamox 0.1 mL undiltued was injected into the eye and a drop placed onto the ocular surface.  No wound leaks were noted.  The patient was taken to the recovery room in stable condition without complications of anesthesia or surgery  Benay Pillow 12/02/2020, 11:29 AM

## 2020-12-02 NOTE — Transfer of Care (Signed)
Immediate Anesthesia Transfer of Care Note  Patient: Lisa Yu  Procedure(s) Performed: CATARACT EXTRACTION PHACO AND INTRAOCULAR LENS PLACEMENT (IOC) LEFT (Left Eye)  Patient Location: PACU  Anesthesia Type: MAC  Level of Consciousness: awake, alert  and patient cooperative  Airway and Oxygen Therapy: Patient Spontanous Breathing and Patient connected to supplemental oxygen  Post-op Assessment: Post-op Vital signs reviewed, Patient's Cardiovascular Status Stable, Respiratory Function Stable, Patent Airway and No signs of Nausea or vomiting  Post-op Vital Signs: Reviewed and stable  Complications: No complications documented.

## 2020-12-02 NOTE — H&P (Signed)
[name]   Primary Care Physician:  Rusty Aus, MD Ophthalmologist: Dr. Benay Pillow  Pre-Procedure History & Physical: HPI:  Lisa Yu is a 70 y.o. female here for cataract surgery.   Past Medical History:  Diagnosis Date  . Anxiety   . Arthritis   . Back pain   . Diverticulosis   . IBS (irritable bowel syndrome)   . Osteopenia 12/21/2007  . Seizures ([place])    X 1 IN 2009    Past Surgical History:  Procedure Laterality Date  . AUGMENTATION MAMMAPLASTY Bilateral 1998   removed 2013  . BREAST SURGERY  1983   bilateral implants  . CATARACT EXTRACTION W/PHACO Right 11/18/2020   Procedure: CATARACT EXTRACTION PHACO AND INTRAOCULAR LENS PLACEMENT (IOC) RIGHT PANOPTIX LENS 3.49 00:29.8;  Surgeon: Eulogio Bear, MD;  Location: [facility];  Service: Ophthalmology;  Laterality: Right;  . cervical disease  1999   H/O Cryo  . CHONDROPLASTY Right 05/13/2016   Procedure: CHONDROPLASTY;  Surgeon: Dereck Leep, MD;  Location: [facility];  Service: Orthopedics;  Laterality: Right;  . COLONOSCOPY  05/01/2005  . FOOT SURGERY Bilateral   . KNEE ARTHROPLASTY Right 04/26/2019   Procedure: COMPUTER ASSISTED TOTAL KNEE ARTHROPLASTY;  Surgeon: Dereck Leep, MD;  Location: [facility];  Service: Orthopedics;  Laterality: Right;  . KNEE ARTHROSCOPY Right 05/13/2016   Procedure: ARTHROSCOPY KNEE WITH MEDIAL AND LATERAL MENISECTOMY;  Surgeon: Dereck Leep, MD;  Location: [facility];  Service: Orthopedics;  Laterality: Right;  . LASIK  2007  . MOUTH SURGERY     DENTAL IMPLANTS (TOP)  . VAGINAL DELIVERY     x 1    Prior to Admission medications   Medication Sig Start Date End Date Taking? Authorizing Provider  acetaminophen (TYLENOL) 500 MG tablet Take 500 mg by mouth every 6 (six) hours as needed.   Yes [provider]  Ascorbic Acid (VITAMIN C PO) Take by mouth daily.   Yes [provider]  CALCIUM PO Take by mouth daily.   Yes [provider]  clonazePAM (KLONOPIN) 0.5 MG tablet Take 0.5 mg by mouth at bedtime.   Yes [provider]  hyoscyamine (LEVSIN) 0.125 MG tablet Take 1 tablet (0.125 mg total) by mouth every 4 (four) hours as needed. Patient taking differently: Take 0.125 mg by mouth every 4 (four) hours as needed for cramping. 11/29/17  Yes Lucille Passy, MD  lamoTRIgine (LAMICTAL) 200 MG tablet Take 1 tablet (200 mg total) by mouth 2 (two) times daily. 05/24/17  Yes Dennie Bible, NP  Multiple Vitamin (MULTIVITAMIN) tablet Take 1 tablet by mouth daily.   Yes [provider]  Vitamin D, Cholecalciferol, 50 MCG (2000 UT) CAPS Take 2,000 Units by mouth daily.   Yes [provider]  aspirin 81 MG tablet Take 81 mg by mouth daily.   Patient not taking: Reported on 11/11/2020    [provider]  fluticasone (FLONASE) 50 MCG/ACT nasal spray Place 2 sprays into both nostrils daily as needed. Reported on 12/27/2015 Patient not taking: No sig reported 10/27/17   Lucille Passy, MD  VITAMIN E PO Take by mouth daily.    [provider]    Allergies as of 10/04/2020 - Review Complete 04/26/2019  Allergen Reaction Noted  . Other Itching and Rash 04/23/2016  . Penicillins      Family History  Problem Relation Age of Onset  . Cancer Mother  skin  . Breast cancer Neg Hx     Social History   Socioeconomic History  . Marital status: Married    Spouse name: Ernie Hew  . Number of children: 1  . Years of education: 53 th  . Highest education level: Not on file  Occupational History  . Occupation: Retired from [employer]  . Smoking status: Never Smoker  . Smokeless tobacco: Never Used  Vaping Use  . Vaping Use: Never used  Substance and Sexual Activity  . Alcohol use: Yes    Alcohol/week: 1.0 standard drink    Types: 1 Standard drinks or equivalent per week  . Drug use: No  . Sexual activity: Not on file  Other Topics Concern  . Not on file   Social History Narrative   Patient lives at home with her husband Ernie Hew).   Retired and she is self employed.   Education 12 th    Right handed.   Caffeine two cups of coffee one soda daily.      Would desire CPR, no prolonged life support if futile.   Social Determinants of Health   Financial Resource Strain: Not on file  Food Insecurity: Not on file  Transportation Needs: Not on file  Physical Activity: Not on file  Stress: Not on file  Social Connections: Not on file  Intimate Partner Violence: Not on file    Review of Systems: See HPI, otherwise negative ROS  Physical Exam: BP (!) 171/92   Pulse 73   Temp (!) 97.4 F (36.3 C) (Temporal)   Resp 16   Ht 5\' 5"  (1.651 m)   Wt 84.4 kg   SpO2 100%   BMI 30.95 kg/m  General:   Alert,  pleasant and cooperative in NAD Head:  Normocephalic and atraumatic. Respiratory:  Normal work of breathing. Cardiovascular:  RRR  Impression/Plan: Lisa Yu is here for cataract surgery.  Risks, benefits, limitations, and alternatives regarding cataract surgery have been reviewed with the patient.  Questions have been answered.  All parties agreeable.   Benay Pillow, MD  12/02/2020, 11:03 AM

## 2020-12-02 NOTE — Anesthesia Procedure Notes (Signed)
Procedure Name: MAC Date/Time: 12/02/2020 11:10 AM Performed by: Cameron Ali, CRNA Pre-anesthesia Checklist: Patient identified, Emergency Drugs available, Suction available, Timeout performed and Patient being monitored Patient Re-evaluated:Patient Re-evaluated prior to induction Oxygen Delivery Method: Nasal cannula Placement Confirmation: positive ETCO2

## 2020-12-03 ENCOUNTER — Encounter: Payer: Self-pay | Admitting: Ophthalmology

## 2020-12-26 DIAGNOSIS — E538 Deficiency of other specified B group vitamins: Secondary | ICD-10-CM | POA: Insufficient documentation

## 2021-03-20 IMAGING — DX DG KNEE 1-2V PORT*R*
2 series · 2 of 2 positions shown · non-contrast
Comparison: None.

CLINICAL DATA: Total knee replacement

EXAM:
PORTABLE RIGHT KNEE - 1-2 VIEW

[knee ap]
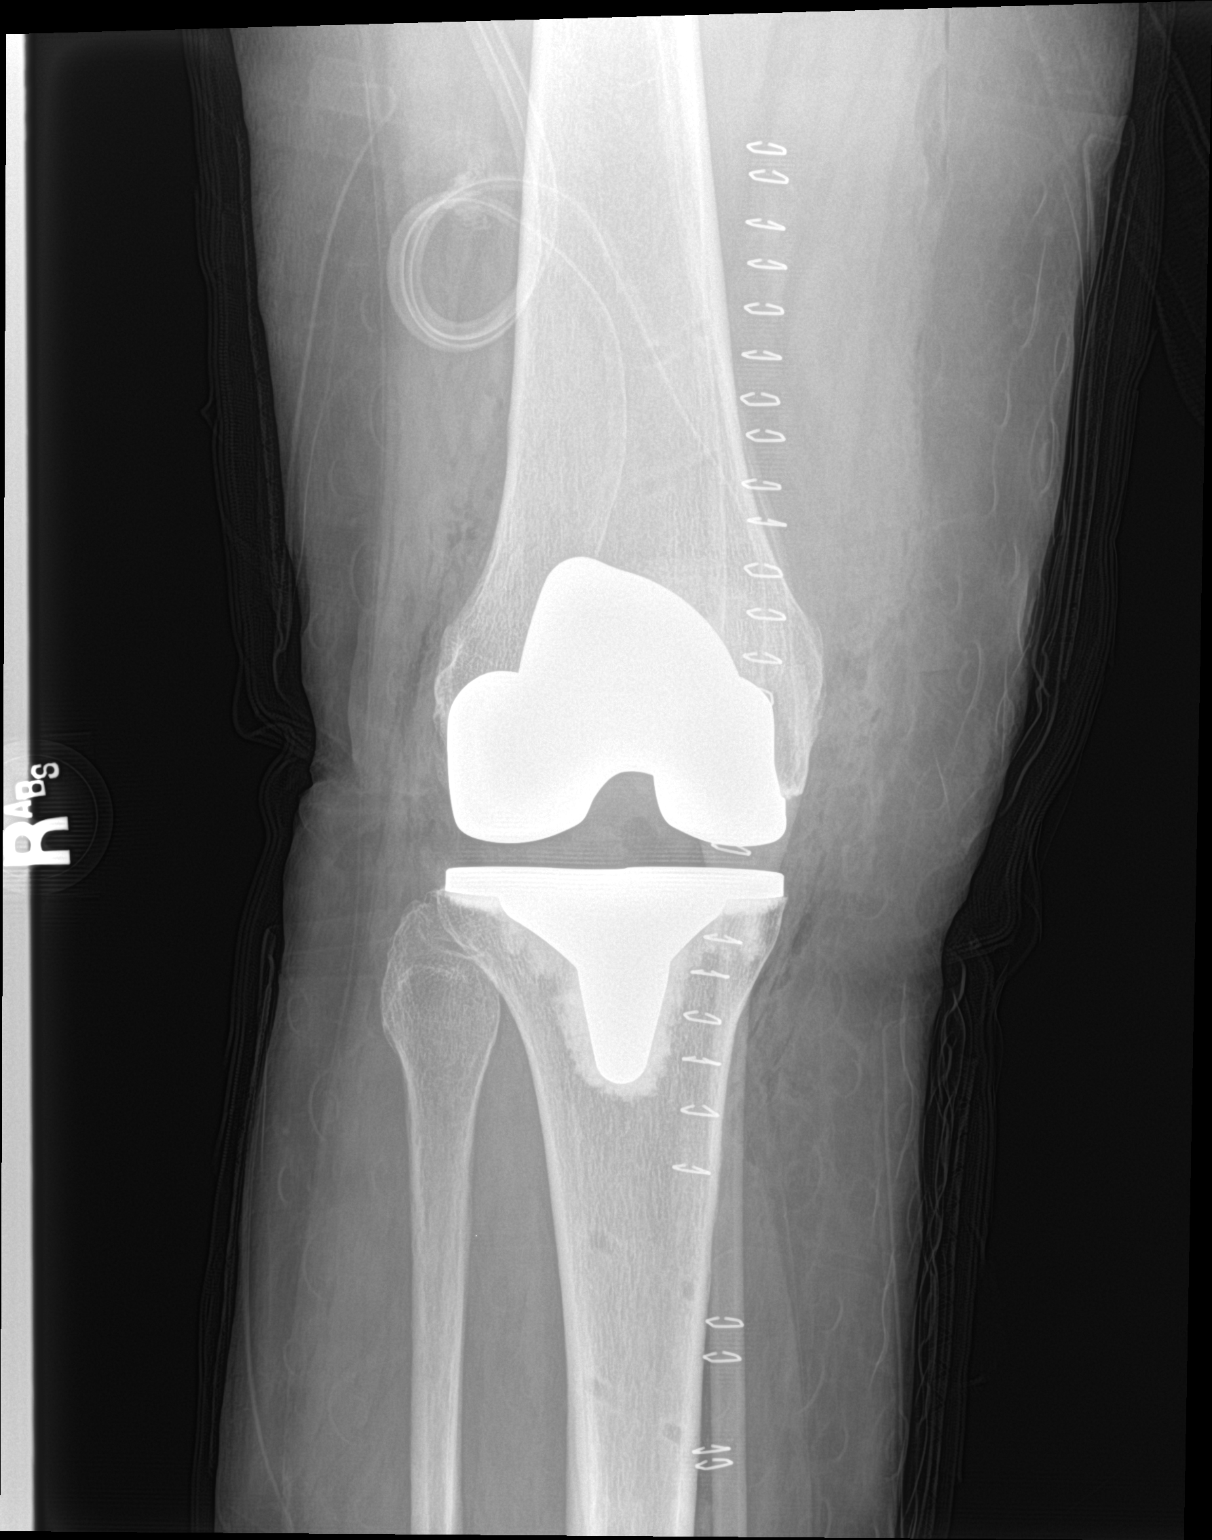

[knee lat]
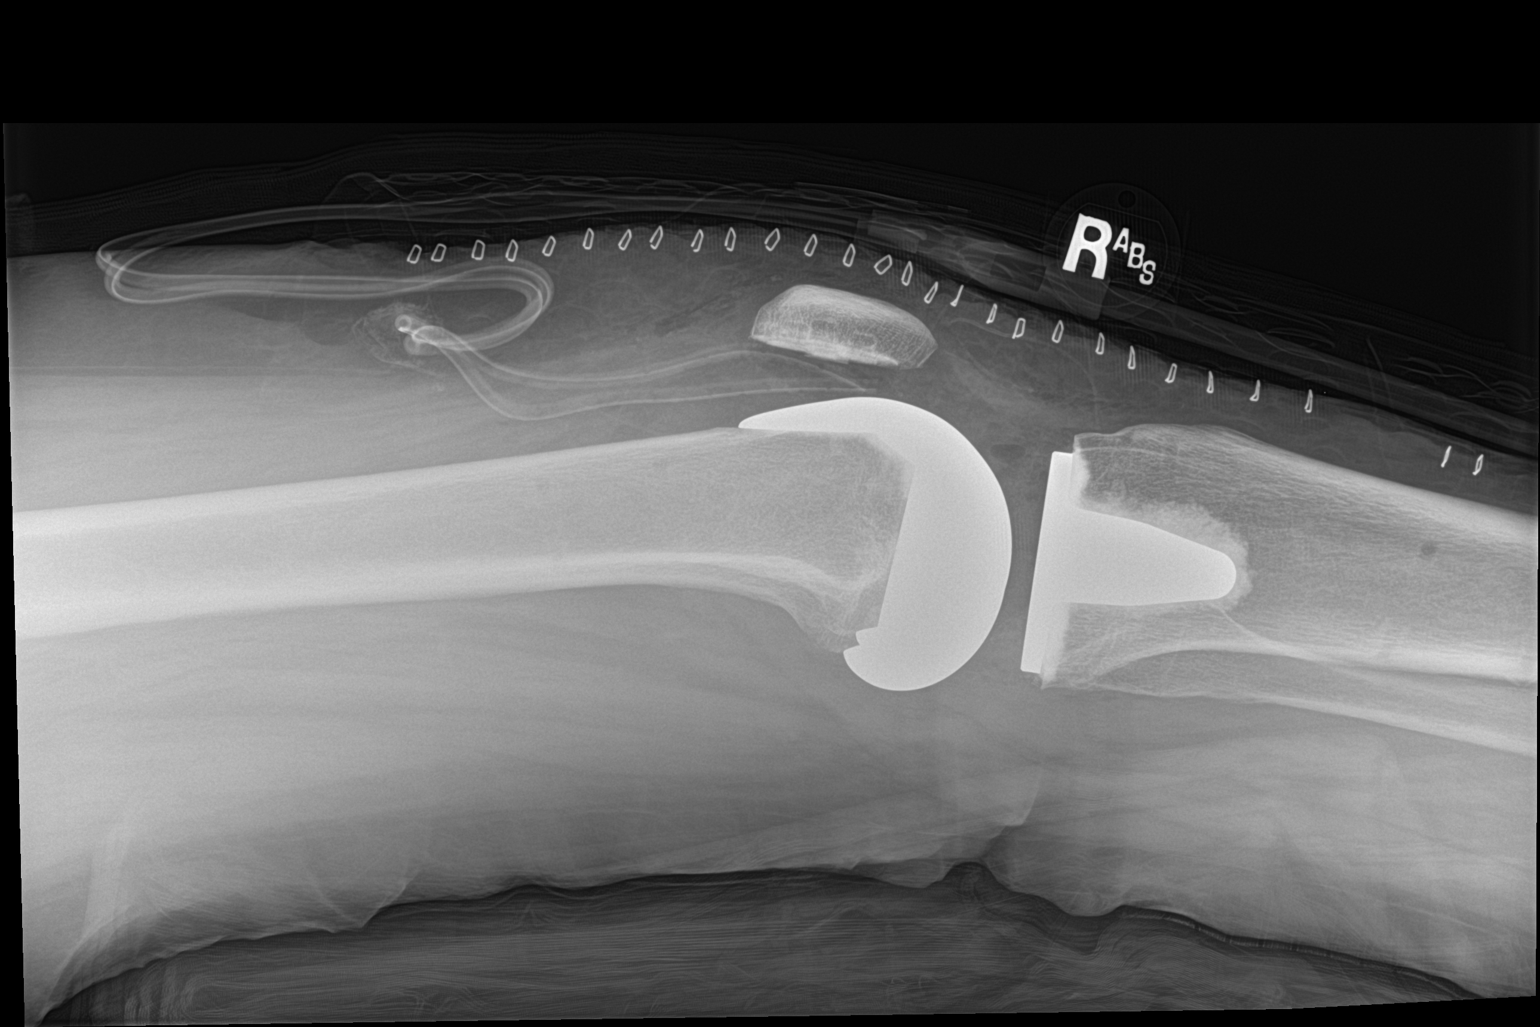

[2 of 2 positions shown; findings below may reference images not displayed]

FINDINGS: The right knee demonstrates a total knee arthroplasty without
evidence of hardware failure complication. There is no significant
joint effusion. There is no fracture or dislocation. The alignment
is anatomic. Surgical drains are present. Post-surgical changes
noted in the surrounding soft tissues.
IMPRESSION: Interval right total knee arthroplasty.

## 2021-08-06 ENCOUNTER — Other Ambulatory Visit: Payer: Self-pay | Admitting: Internal Medicine

## 2021-08-06 DIAGNOSIS — Z1231 Encounter for screening mammogram for malignant neoplasm of breast: Secondary | ICD-10-CM

## 2021-09-16 ENCOUNTER — Ambulatory Visit: Payer: Medicare HMO

## 2021-10-14 ENCOUNTER — Other Ambulatory Visit: Payer: Self-pay | Admitting: Internal Medicine

## 2021-10-14 ENCOUNTER — Other Ambulatory Visit (HOSPITAL_COMMUNITY): Payer: Self-pay | Admitting: Internal Medicine

## 2021-10-14 DIAGNOSIS — R911 Solitary pulmonary nodule: Secondary | ICD-10-CM

## 2021-10-22 ENCOUNTER — Ambulatory Visit
Admission: RE | Admit: 2021-10-22 | Discharge: 2021-10-22 | Disposition: A | Discharge status: Home / Self Care | Payer: Medicare HMO | Source: Ambulatory Visit | Attending: Internal Medicine | Admitting: Internal Medicine

## 2021-10-22 ENCOUNTER — Other Ambulatory Visit: Payer: Self-pay

## 2021-10-22 DIAGNOSIS — R911 Solitary pulmonary nodule: Secondary | ICD-10-CM | POA: Diagnosis not present

## 2021-10-22 LAB — POCT I-STAT CREATININE: Creatinine, Ser: 0.7 mg/dL (ref 0.44–1.00)

## 2021-10-22 MED ORDER — IOHEXOL 300 MG/ML  SOLN
75.0000 mL | Freq: Once | INTRAMUSCULAR | Status: AC | PRN
Start: 2021-10-22 — End: 2021-10-22
  Administered 2021-10-22: 75 mL via INTRAVENOUS

## 2021-10-28 ENCOUNTER — Other Ambulatory Visit: Payer: Self-pay | Admitting: Internal Medicine

## 2021-10-28 DIAGNOSIS — R911 Solitary pulmonary nodule: Secondary | ICD-10-CM

## 2021-11-06 ENCOUNTER — Ambulatory Visit
Admission: RE | Admit: 2021-11-06 | Discharge: 2021-11-06 | Disposition: A | Discharge status: Home / Self Care | Payer: Medicare HMO | Source: Ambulatory Visit | Attending: Internal Medicine | Admitting: Internal Medicine

## 2021-11-06 DIAGNOSIS — Z1231 Encounter for screening mammogram for malignant neoplasm of breast: Secondary | ICD-10-CM | POA: Diagnosis present

## 2021-11-10 ENCOUNTER — Other Ambulatory Visit: Payer: Self-pay | Admitting: Internal Medicine

## 2021-11-10 DIAGNOSIS — N63 Unspecified lump in unspecified breast: Secondary | ICD-10-CM

## 2021-11-10 DIAGNOSIS — R928 Other abnormal and inconclusive findings on diagnostic imaging of breast: Secondary | ICD-10-CM

## 2021-11-25 ENCOUNTER — Ambulatory Visit
Admission: RE | Admit: 2021-11-25 | Discharge: 2021-11-25 | Disposition: A | Discharge status: Home / Self Care | Payer: Medicare HMO | Source: Ambulatory Visit | Attending: Internal Medicine | Admitting: Internal Medicine

## 2021-11-25 DIAGNOSIS — R928 Other abnormal and inconclusive findings on diagnostic imaging of breast: Secondary | ICD-10-CM

## 2021-11-25 DIAGNOSIS — N63 Unspecified lump in unspecified breast: Secondary | ICD-10-CM

## 2021-11-28 ENCOUNTER — Other Ambulatory Visit: Payer: Self-pay | Admitting: Internal Medicine

## 2021-11-28 DIAGNOSIS — R928 Other abnormal and inconclusive findings on diagnostic imaging of breast: Secondary | ICD-10-CM

## 2021-11-28 DIAGNOSIS — N63 Unspecified lump in unspecified breast: Secondary | ICD-10-CM

## 2022-01-05 ENCOUNTER — Ambulatory Visit
Admission: RE | Admit: 2022-01-05 | Discharge: 2022-01-05 | Disposition: A | Discharge status: Home / Self Care | Payer: Medicare HMO | Source: Ambulatory Visit | Attending: Internal Medicine | Admitting: Internal Medicine

## 2022-01-05 DIAGNOSIS — D242 Benign neoplasm of left breast: Secondary | ICD-10-CM | POA: Insufficient documentation

## 2022-01-05 DIAGNOSIS — R928 Other abnormal and inconclusive findings on diagnostic imaging of breast: Secondary | ICD-10-CM | POA: Diagnosis not present

## 2022-01-05 DIAGNOSIS — N63 Unspecified lump in unspecified breast: Secondary | ICD-10-CM

## 2022-01-05 DIAGNOSIS — N6320 Unspecified lump in the left breast, unspecified quadrant: Secondary | ICD-10-CM | POA: Diagnosis present

## 2022-05-04 ENCOUNTER — Ambulatory Visit: Payer: Medicare HMO

## 2022-05-11 ENCOUNTER — Ambulatory Visit
Admission: RE | Admit: 2022-05-11 | Discharge: 2022-05-11 | Disposition: A | Discharge status: Home / Self Care | Payer: Medicare HMO | Source: Ambulatory Visit | Attending: Internal Medicine | Admitting: Internal Medicine

## 2022-05-11 DIAGNOSIS — R911 Solitary pulmonary nodule: Secondary | ICD-10-CM | POA: Diagnosis present

## 2022-09-15 DIAGNOSIS — F330 Major depressive disorder, recurrent, mild: Secondary | ICD-10-CM | POA: Insufficient documentation

## 2022-09-30 ENCOUNTER — Other Ambulatory Visit: Payer: Self-pay | Admitting: Internal Medicine

## 2022-09-30 DIAGNOSIS — R918 Other nonspecific abnormal finding of lung field: Secondary | ICD-10-CM

## 2022-10-12 ENCOUNTER — Ambulatory Visit
Admission: RE | Admit: 2022-10-12 | Discharge: 2022-10-12 | Disposition: A | Discharge status: Home / Self Care | Payer: Medicare HMO | Source: Ambulatory Visit | Attending: Internal Medicine | Admitting: Internal Medicine

## 2022-10-12 DIAGNOSIS — R918 Other nonspecific abnormal finding of lung field: Secondary | ICD-10-CM | POA: Diagnosis present

## 2022-10-12 MED ORDER — IOHEXOL 300 MG/ML  SOLN
75.0000 mL | Freq: Once | INTRAMUSCULAR | Status: AC | PRN
  Administered 2022-10-12: 75 mL via INTRAVENOUS

## 2022-10-14 ENCOUNTER — Other Ambulatory Visit
Admission: RE | Admit: 2022-10-14 | Discharge: 2022-10-14 | Disposition: A | Discharge status: Home / Self Care | Payer: Medicare HMO | Source: Ambulatory Visit | Attending: Family Medicine | Admitting: Family Medicine

## 2022-10-14 ENCOUNTER — Other Ambulatory Visit: Payer: Self-pay | Admitting: Family Medicine

## 2022-10-14 ENCOUNTER — Ambulatory Visit
Admission: RE | Admit: 2022-10-14 | Discharge: 2022-10-14 | Disposition: A | Discharge status: Home / Self Care | Payer: Medicare HMO | Source: Ambulatory Visit | Attending: Family Medicine | Admitting: Family Medicine

## 2022-10-14 DIAGNOSIS — R7989 Other specified abnormal findings of blood chemistry: Secondary | ICD-10-CM

## 2022-10-14 DIAGNOSIS — R053 Chronic cough: Secondary | ICD-10-CM | POA: Diagnosis present

## 2022-10-14 LAB — D-DIMER, QUANTITATIVE: D-Dimer, Quant: 0.8 ug/mL-FEU — ABNORMAL HIGH (ref 0.00–0.50)

## 2022-10-14 MED ORDER — IOHEXOL 350 MG/ML SOLN
75.0000 mL | Freq: Once | INTRAVENOUS | Status: AC | PRN
  Administered 2022-10-14: 75 mL via INTRAVENOUS

## 2022-12-11 ENCOUNTER — Other Ambulatory Visit: Payer: Self-pay | Admitting: General Surgery

## 2022-12-11 DIAGNOSIS — K439 Ventral hernia without obstruction or gangrene: Secondary | ICD-10-CM

## 2022-12-15 ENCOUNTER — Ambulatory Visit: Admission: RE | Admit: 2022-12-15 | Payer: Medicare HMO | Source: Ambulatory Visit

## 2022-12-23 ENCOUNTER — Ambulatory Visit
Admission: RE | Admit: 2022-12-23 | Discharge: 2022-12-23 | Disposition: A | Discharge status: Home / Self Care | Payer: Medicare HMO | Source: Ambulatory Visit | Attending: General Surgery | Admitting: General Surgery

## 2022-12-23 DIAGNOSIS — K439 Ventral hernia without obstruction or gangrene: Secondary | ICD-10-CM | POA: Diagnosis present

## 2023-08-11 ENCOUNTER — Other Ambulatory Visit: Payer: Self-pay | Admitting: Orthopedic Surgery

## 2023-08-11 DIAGNOSIS — G8929 Other chronic pain: Secondary | ICD-10-CM

## 2023-08-13 ENCOUNTER — Encounter: Payer: Self-pay | Admitting: Orthopedic Surgery

## 2023-08-21 ENCOUNTER — Ambulatory Visit
Admission: RE | Admit: 2023-08-21 | Discharge: 2023-08-21 | Disposition: A | Discharge status: Home / Self Care | Payer: Medicare HMO | Source: Ambulatory Visit | Attending: Orthopedic Surgery | Admitting: Orthopedic Surgery

## 2023-08-21 DIAGNOSIS — G8929 Other chronic pain: Secondary | ICD-10-CM

## 2023-09-26 NOTE — Discharge Instructions (Addendum)
 Instructions after Total Knee Replacement   James P. Angie Fava., M.D.    Dept. of Orthopaedics & Sports Medicine [facility] [address] [place], [place]  [address]  Phone: [phone]   Fax: [phone]       [email or web address]       DIET: Drink plenty of non-alcoholic fluids. Resume your normal diet. Include foods high in fiber.  ACTIVITY:  You may use crutches or a walker with weight-bearing as tolerated, unless instructed otherwise. You may be weaned off of the walker or crutches by your Physical Therapist.  Do NOT place pillows under the knee. Anything placed under the knee could limit your ability to straighten the knee.   Use the Bone Foam 3 times a day for 30 minutes each session to help straighten the knee. Continue doing gentle exercises. Exercising will reduce the pain and swelling, increase motion, and prevent muscle weakness.   Please continue to use the TED compression stockings for 6 weeks. You may remove the stockings at night, but should reapply them in the morning. Do not drive or operate any equipment until instructed.  WOUND CARE:  The initial dressing (Aquacel) can remain in place for 7 days (see separate instructions). Continue to use the PolarCare or ice packs periodically to reduce pain and swelling. You may bathe or shower after the staples are removed at the first office visit following surgery.  MEDICATIONS: You may resume your regular medications. Please take the pain medication as prescribed on the medication. Do not take pain medication on an empty stomach. Unless instructed otherwise, you should take an enteric-coated aspirin 81 mg. TWICE a day. (This along with elevation will help reduce the possibility of blood clots/phlebitis in your operated leg.) Use a stool softener (such as Senokot-S or Colace) daily and a laxative (such as Miralax or Dulcolax) as needed to prevent constipation.  Do not drive or drink alcoholic beverages when  taking pain medications.  CALL THE OFFICE FOR: Temperature above 101 degrees Excessive bleeding or drainage on the dressing. Excessive swelling, coldness, or paleness of the toes. Persistent nausea and vomiting.  FOLLOW-UP:  You should have an appointment to return to the office in 10-14 days after surgery. Arrangements have been made for continuation of Physical Therapy (either home therapy or outpatient therapy).     [facility] Department Directory         [email or web address]       [email or web address]          Cardiology  Appointments: [place] - [phone]  Endocrinology  Appointments: [place] [phone] [place] - [phone]  Gastroenterology  Appointments: [place] [phone] [place] - [phone]        General Surgery   Appointments: [facility]  Internal Medicine/Family Medicine  Appointments: [facility] [place] - [phone] [place] - [phone]  Metabolic and Weigh Loss Surgery  Appointments: [facility]        Neurology  Appointments: [place] [phone] [place] - [phone]  Neurosurgery  Appointments: [place]  Obstetrics & Gynecology  Appointments: [place] [phone] [place] - [phone]        Pediatrics  Appointments: [place] [phone] [place] - [phone]  Physiatry  Appointments: [place] [phone]  Physical Therapy  Appointments: [place] - [phone]        Podiatry  Appointments: [place] [phone] [place] - [phone]  Pulmonology  Appointments: [place]  Rheumatology  Appointments: [place] [phone]  [place] Location: [facility]  [address] [place], [place]  [address]  [place] Location: [facility]. [address] [place], [place]  [address]  [place]  Location: [facility] [address] [place], [place]  [address]     [employer].  [address] [place], [place], [address]. [phone] They will call you to arrange when they can come to see you

## 2023-09-28 ENCOUNTER — Encounter
Admission: RE | Admit: 2023-09-28 | Discharge: 2023-09-28 | Disposition: A | Discharge status: Home / Self Care | Payer: Medicare HMO | Source: Ambulatory Visit | Attending: Orthopedic Surgery | Admitting: Orthopedic Surgery

## 2023-09-28 ENCOUNTER — Other Ambulatory Visit: Payer: Self-pay

## 2023-09-28 DIAGNOSIS — Z01818 Encounter for other preprocedural examination: Secondary | ICD-10-CM | POA: Insufficient documentation

## 2023-09-28 DIAGNOSIS — M1712 Unilateral primary osteoarthritis, left knee: Secondary | ICD-10-CM | POA: Diagnosis not present

## 2023-09-28 DIAGNOSIS — Z01812 Encounter for preprocedural laboratory examination: Secondary | ICD-10-CM

## 2023-09-28 HISTORY — DX: Essential (primary) hypertension: I10

## 2023-09-28 HISTORY — DX: Iron deficiency anemia, unspecified: D50.9

## 2023-09-28 HISTORY — DX: Deficiency of other specified B group vitamins: E53.8

## 2023-09-28 HISTORY — DX: Localization-related (focal) (partial) symptomatic epilepsy and epileptic syndromes with complex partial seizures, not intractable, without status epilepticus: G40.209

## 2023-09-28 HISTORY — DX: Unilateral primary osteoarthritis, left knee: M17.12

## 2023-09-28 HISTORY — DX: Mixed hyperlipidemia: E78.2

## 2023-09-28 HISTORY — DX: Vitamin D deficiency, unspecified: E55.9

## 2023-09-28 LAB — URINALYSIS, ROUTINE W REFLEX MICROSCOPIC
Bilirubin Urine: NEGATIVE
Glucose, UA: NEGATIVE mg/dL
Hgb urine dipstick: NEGATIVE
Ketones, ur: NEGATIVE mg/dL
Leukocytes,Ua: NEGATIVE
Nitrite: NEGATIVE
Protein, ur: NEGATIVE mg/dL
Specific Gravity, Urine: 1.025 (ref 1.005–1.030)
pH: 5 (ref 5.0–8.0)

## 2023-09-28 LAB — SURGICAL PCR SCREEN
MRSA, PCR: NEGATIVE
Staphylococcus aureus: NEGATIVE

## 2023-09-28 LAB — C-REACTIVE PROTEIN: CRP: 0.7 mg/dL (ref <1.0)

## 2023-09-28 LAB — SEDIMENTATION RATE: Sed Rate: 20 mm/h (ref 0–30)

## 2023-09-28 NOTE — Patient Instructions (Addendum)
 Your procedure is scheduled on: Wednesday, March 12 Report to the Registration Desk on the 1st floor of the [facility] To find out your arrival time, please call [phone] between 1PM - 3PM on: Tuesday, March 11 If your arrival time is 6:00 am, do not arrive before that time as the [facility] entrance doors do not open until 6:00 am.  REMEMBER: Instructions that are not followed completely may result in serious medical risk, up to and including death; or upon the discretion of your surgeon and anesthesiologist your surgery may need to be rescheduled.  Do not eat food after midnight the night before surgery.  No gum chewing or hard candies.  You may however, drink CLEAR liquids up to 2 hours before you are scheduled to arrive for your surgery. Do not drink anything within 2 hours of your scheduled arrival time.  Clear liquids include: - water  - apple juice without pulp - gatorade (not RED colors) - black coffee or tea (Do NOT add milk or creamers to the coffee or tea) Do NOT drink anything that is not on this list.  In addition, your doctor has ordered for you to drink the provided:  Ensure Pre-Surgery Clear Carbohydrate Drink  Drinking this carbohydrate drink up to two hours before surgery helps to reduce insulin resistance and improve patient outcomes. Please complete drinking 2 hours before scheduled arrival time.  One week prior to surgery: starting March 5 Stop aspirin and Anti-inflammatories (NSAIDS) such as Advil, Aleve, Ibuprofen, Motrin, Naproxen, Naprosyn and Aspirin based products such as Excedrin, Goody's Powder, BC Powder. Stop ANY OVER THE COUNTER supplements until after surgery. Stop vitamin C, calcium, vitamin D, vitamin B12, multiple vitamins.  You may however, continue to take Tylenol if needed for pain up until the day of surgery.  Continue taking all of your other prescription medications up until the day of surgery.  ON THE DAY OF SURGERY ONLY TAKE THESE  MEDICATIONS WITH SIPS OF WATER:  lamoTRIgine (LAMICTAL)  PARoxetine (PAXIL)   No Alcohol for 24 hours before or after surgery.  No Smoking including e-cigarettes for 24 hours before surgery.  No chewable tobacco products for at least 6 hours before surgery.  No nicotine patches on the day of surgery.  Do not use any "recreational" drugs for at least a week (preferably 2 weeks) before your surgery.  Please be advised that the combination of cocaine and anesthesia may have negative outcomes, up to and including death. If you test positive for cocaine, your surgery will be cancelled.  On the morning of surgery brush your teeth with toothpaste and water, you may rinse your mouth with mouthwash if you wish. Do not swallow any toothpaste or mouthwash.  Use CHG Soap as directed on instruction sheet.  Do not wear jewelry, make-up, hairpins, clips or nail polish.  For welded (permanent) jewelry: bracelets, anklets, waist bands, etc.  Please have this removed prior to surgery.  If it is not removed, there is a chance that hospital personnel will need to cut it off on the day of surgery.  Do not wear lotions, powders, or perfumes.   Do not shave body hair from the neck down 48 hours before surgery.  Contact lenses, hearing aids and dentures may not be worn into surgery.  Do not bring valuables to the hospital. [facility] is not responsible for any missing/lost belongings or valuables.   Notify your doctor if there is any change in your medical condition (cold, fever,  infection).  Wear comfortable clothing (specific to your surgery type) to the hospital.  After surgery, you can help prevent lung complications by doing breathing exercises.  Take deep breaths and cough every 1-2 hours. Your doctor may order a device called an Incentive Spirometer to help you take deep breaths.  If you are being admitted to the hospital overnight, leave your suitcase in the car. After surgery it may be  brought to your room.  In case of increased patient census, it may be necessary for you, the patient, to continue your postoperative care in the Same Day Surgery department.  If you are being discharged the day of surgery, you will not be allowed to drive home. You will need a responsible individual to drive you home and stay with you for 24 hours after surgery.   If you are taking public transportation, you will need to have a responsible individual with you.  Please call the Pre-admissions Testing Dept. at [phone] if you have any questions about these instructions.  Surgery Visitation Policy:  Patients having surgery or a procedure may have two visitors.  Children under the age of 24 must have an adult with them who is not the patient.  Temporary Visitor Restrictions Due to increasing cases of flu, RSV and COVID-19: Children ages 82 and under will not be able to visit patients in [facility] hospitals under most circumstances.  Inpatient Visitation:    Visiting hours are 7 a.m. to 8 p.m. Up to four visitors are allowed at one time in a patient room. The visitors may rotate out with other people during the day.  One visitor age 43 or older may stay with the patient overnight and must be in the room by 8 p.m.     Pre-operative 5 CHG Bath Instructions   You can play a key role in reducing the risk of infection after surgery. Your skin needs to be as free of germs as possible. You can reduce the number of germs on your skin by washing with CHG (chlorhexidine gluconate) soap before surgery. CHG is an antiseptic soap that kills germs and continues to kill germs even after washing.   DO NOT use if you have an allergy to chlorhexidine/CHG or antibacterial soaps. If your skin becomes reddened or irritated, stop using the CHG and notify one of our RNs at [phone].   Please shower with the CHG soap starting 4 days before surgery using the following schedule:     Please keep  in mind the following:  DO NOT shave, including legs and underarms, starting the day of your first shower.   You may shave your face at any point before/day of surgery.  Place clean sheets on your bed the day you start using CHG soap. Use a clean washcloth (not used since being washed) for each shower. DO NOT sleep with pets once you start using the CHG.   CHG Shower Instructions:  If you choose to wash your hair and private area, wash first with your normal shampoo/soap.  After you use shampoo/soap, rinse your hair and body thoroughly to remove shampoo/soap residue.  Turn the water OFF and apply about 3 tablespoons (45 ml) of CHG soap to a CLEAN washcloth.  Apply CHG soap ONLY FROM YOUR NECK DOWN TO YOUR TOES (washing for 3-5 minutes)  DO NOT use CHG soap on face, private areas, open wounds, or sores.  Pay special attention to the area where your surgery is being performed.  If  you are having back surgery, having someone wash your back for you may be helpful. Wait 2 minutes after CHG soap is applied, then you may rinse off the CHG soap.  Pat dry with a clean towel  Put on clean clothes/pajamas   If you choose to wear lotion, please use ONLY the CHG-compatible lotions on the back of this paper.     Additional instructions for the day of surgery: DO NOT APPLY any lotions, deodorants, cologne, or perfumes.   Put on clean/comfortable clothes.  Brush your teeth.  Ask your nurse before applying any prescription medications to the skin.      CHG Compatible Lotions   Aveeno Moisturizing lotion  Cetaphil Moisturizing Cream  Cetaphil Moisturizing Lotion  Clairol Herbal Essence Moisturizing Lotion, Dry Skin  Clairol Herbal Essence Moisturizing Lotion, Extra Dry Skin  Clairol Herbal Essence Moisturizing Lotion, Normal Skin  Curel Age Defying Therapeutic Moisturizing Lotion with Alpha Hydroxy  Curel Extreme Care Body Lotion  Curel Soothing Hands Moisturizing Hand Lotion  Curel  Therapeutic Moisturizing Cream, Fragrance-Free  Curel Therapeutic Moisturizing Lotion, Fragrance-Free  Curel Therapeutic Moisturizing Lotion, Original Formula  Eucerin Daily Replenishing Lotion  Eucerin Dry Skin Therapy Plus Alpha Hydroxy Crme  Eucerin Dry Skin Therapy Plus Alpha Hydroxy Lotion  Eucerin Original Crme  Eucerin Original Lotion  Eucerin Plus Crme Eucerin Plus Lotion  Eucerin TriLipid Replenishing Lotion  Keri Anti-Bacterial Hand Lotion  Keri Deep Conditioning Original Lotion Dry Skin Formula Softly Scented  Keri Deep Conditioning Original Lotion, Fragrance Free Sensitive Skin Formula  Keri Lotion Fast Absorbing Fragrance Free Sensitive Skin Formula  Keri Lotion Fast Absorbing Softly Scented Dry Skin Formula  Keri Original Lotion  Keri Skin Renewal Lotion Keri Silky Smooth Lotion  Keri Silky Smooth Sensitive Skin Lotion  Nivea Body Creamy Conditioning Oil  Nivea Body Extra Enriched Lotion  Nivea Body Original Lotion  Nivea Body Sheer Moisturizing Lotion Nivea Crme  Nivea Skin Firming Lotion  NutraDerm 30 Skin Lotion  NutraDerm Skin Lotion  NutraDerm Therapeutic Skin Cream  NutraDerm Therapeutic Skin Lotion  ProShield Protective Hand Cream  Provon moisturizing lotion      Preoperative Educational Videos for Total Hip, Knee and Shoulder Replacements  To better prepare for surgery, please view our videos that explain the physical activity and discharge planning required to have the best surgical recovery at [facility].  [email or web address]  Questions? Call [phone] or email [email or web address]

## 2023-10-03 NOTE — H&P (Signed)
 ORTHOPAEDIC HISTORY & PHYSICAL [name], [name] - 09/27/2023 11:00 AM EST Formatting of this note is different from the original. Images from the original note were not included. Chief Complaint: Chief Complaint Patient presents with Left Knee - Pain, Pre-op Exam TKA 10/06/2023 Lisa Yu  Reason for Visit: The patient is a 73 y.o. female who presents today for a history and physical for an upcoming left total knee arthroplasty to be done by Dr. Ernest Pine on October 06, 2023. The patient has seen Dr. Ernest Pine in the past for reevaluation of her left knee. She reports a 68-month history of left knee pain. The patient is status post right total knee arthroplasty done by Dr. Ernest Pine back on April 26, 2019. She apparently sustained a fall first 6 months ago and then again approximately 3-1/2 months ago. She reports persistent left knee pain. She localizes most of the pain along the medial aspect of the knee. She reports some swelling, no locking, and some giving way of the knee. The pain is aggravated by lateral movements, lying in bed, standing, and walking. The patient has not appreciated any significant improvement despite heat, ice, Tylenol, OTC NSAIDs, and activity modification. She is using a cane for ambulation. The patient states that the knee pain has progressed to the point that it is significantly interfering with her activities of daily living.  Medications: Current Outpatient Medications Medication Sig Dispense Refill aspirin 81 MG EC tablet Take 81 mg by mouth once daily calcium carbonate-vit D3-min 600 mg calcium- 400 unit Tab Take 1 tablet by mouth once daily.  finasteride (PROPECIA) 1 mg tablet Take 1 mg by mouth once daily hyoscyamine (LEVBID) 0.375 mg ER tablet TAKE 1 TABLET BY MOUTH EVERY 12 HOURS AS NEEDED FOR CRAMPING. 180 tablet 0 lamoTRIgine (LAMICTAL) 200 MG tablet Take 1 tablet (200 mg total) by mouth 2 (two) times daily 180 tablet 3 losartan-hydroCHLOROthiazide (HYZAAR)  50-12.5 mg tablet Take 1 tablet by mouth once daily 90 tablet 3 mirtazapine (REMERON) 15 MG tablet Take 1 tablet (15 mg total) by mouth at bedtime 90 tablet 3 multivitamin tablet Take 1 tablet by mouth once daily.  PARoxetine (PAXIL) 20 MG tablet Take 1 tablet (20 mg total) by mouth once daily 90 tablet 3  No current facility-administered medications for this visit.  Allergies: Allergies Allergen Reactions Penicillin Rash Shellfish Containing Products Itching and Rash  Past Medical History: Past Medical History: Diagnosis Date COVID-19 Seizures (CMS/HHS-HCC)  Past Surgical History: Past Surgical History: Procedure Laterality Date left rotator cuff repair 2011 Right knee arthroscopy, partial medial and lateral meniscectomies, and chondroplasty 05/13/2016 Dr Ernest Pine Right total knee arthroplasty using computer-assisted navigation 04/26/2019 Dr Ernest Pine COLONOSCOPY 03/26/2020 Tubular adenoma/Repeat 71yrs/TKT EGD 03/26/2020 Esophagitis/No Repeat/TKT CATARACT EXTRACTION PHOTOREFRACTIVE KERATOTOMY/LASIK  Social History: Social History  Socioeconomic History Marital status: Married Spouse name: [name] Number of children: 1 Years of education: 12 Highest education level: High school graduate Occupational History Occupation: Retired- Self employed Tobacco Use Smoking status: Former Current packs/day: 0.00 Types: Cigarettes Quit date: 1974 Years since quitting: 51.2 Smokeless tobacco: Never Tobacco comments: as teenager Vaping Use Vaping status: Never Used Substance and Sexual Activity Alcohol use: Yes Comment: occasionally. one glass wine a month Drug use: Never Sexual activity: Defer Partners: Male Birth control/protection: Post-menopausal Social History Narrative Education: HS Occupation: retired from [employer] Hobbies: na Marital Status: married  Social Drivers of Catering manager Strain: Low Risk (09/21/2023) Overall Financial  Resource Strain (CARDIA) Difficulty of Paying Living Expenses: Not  hard at all Food Insecurity: No Food Insecurity (09/21/2023) Hunger Vital Sign Worried About Running Out of Food in the Last Year: Never true Ran Out of Food in the Last Year: Never true Transportation Needs: No Transportation Needs (09/21/2023) PRAPARE - Contractor (Medical): No Lack of Transportation (Non-Medical): No Housing Stability: Low Risk (09/21/2023) Housing Stability Vital Sign Unable to Pay for Housing in the Last Year: No Number of Times Moved in the Last Year: 0 Homeless in the Last Year: No  Family History: Family History Problem Relation Name Age of Onset Alcohol abuse Father Other Sister 5 back surgeries  Review of Systems: A comprehensive 14 point ROS was performed, reviewed, and the pertinent orthopaedic findings are documented in the HPI.  Exam BP (!) 154/82  Ht 167.6 cm (5\' 6" )  Wt 90.7 kg (200 lb)  BMI 32.28 kg/m  General: Well-developed, well-nourished female seen in no acute distress. Antalgic gait. No significant valgus or varus thrust to the left knee.  HEENT: Atraumatic, normocephalic. Pupils are equal and reactive to light. Extraocular motion is intact. Sclera are clear. Oropharynx is clear with moist mucosa.  Neck: Supple, nontender, and with good ROM. No thyromegaly, adenopathy, JVD, or carotid bruits.  Lungs: Clear to auscultation bilaterally.  Cardiovascular: Regular rate and rhythm. Normal S1, S2. No murmur . No appreciable gallops or rubs. Peripheral pulses are palpable. No lower extremity edema. Homan`s test is negative.  Abdomen: Soft, nontender, nondistended. Bowel sounds are present.  Extremities: Good strength, stability, and range of motion of the upper extremities. Good range of motion of the hips and ankles.  Heart: Examination of the heart reveals regular, rate, and rhythm. There is no murmur noted on ascultation. There is a  normal apical pulse.  Lungs: Lungs are clear to auscultation. There is no wheeze, rhonchi, or crackles. There is normal expansion of bilateral chest walls.  Left Knee: Soft tissue swelling: mild Effusion: minimal Erythema: none Crepitance: mild Tenderness: medial Alignment: normal Mediolateral laxity: stable Posterior sag: negative Patellar tracking: Good tracking without evidence of subluxation or tilt Atrophy: No significant atrophy. Quadriceps tone was fair to good. Range of motion: 0/3/118 degrees  Neurologic: Awake, alert, and oriented. Sensory function is intact to pinprick and light touch. Motor strength is judged to be 5/5. Motor coordination is within normal limits. No apparent clonus. No tremor.  MRI of the left knee: I reviewed the left knee MRI performed at [facility] on 08/21/2023. I concur with the radiologist's interpretation as below:  MRI OF THE LEFT KNEE WITHOUT CONTRAST  TECHNIQUE: Multiplanar, multisequence MR imaging of the knee was performed. No intravenous contrast was administered.  COMPARISON: None Available.  FINDINGS: MENISCI  Medial: Radial tear of the posterior horn of the medial meniscus with peripheral meniscal extrusion.  Lateral: Radial tear of the anterior horn lateral meniscus towards the meniscal root.  LIGAMENTS  Cruciates: ACL and PCL are intact.  Collaterals: Medial collateral ligament is intact. Lateral collateral ligament complex is intact.  CARTILAGE  Patellofemoral: Moderate partial-thickness cartilage loss of the lateral patellar facet.  Medial: Moderate partial-thickness cartilage loss of the weight-bearing medial femorotibial compartment .  Lateral: Mild partial-thickness cartilage loss of the weight-bearing lateral femorotibial compartment.  JOINT: Moderate joint effusion. Normal Hoffa's fat-pad. No plical thickening.  POPLITEAL FOSSA: Popliteus tendon is intact. Moderate  leaking Baker's cyst.  EXTENSOR MECHANISM: Intact quadriceps tendon. Intact patellar tendon. Intact lateral patellar retinaculum. Intact medial patellar retinaculum. Intact MPFL.  BONES: No aggressive osseous  lesion. Subchondral nondisplaced, nondepressed fracture of the weight-bearing medial femoral condyle with severe surrounding bone marrow edema.  Other: No fluid collection or hematoma. Muscles are normal.  IMPRESSION: 1. Subchondral nondisplaced, nondepressed fracture of the weight-bearing medial femoral condyle with severe surrounding bone marrow edema. 2. Radial tear of the posterior horn of the medial meniscus with peripheral meniscal extrusion. 3. Radial tear of the anterior horn lateral meniscus towards the meniscal root. 4. Tricompartmental cartilage abnormalities as described above.  Electronically Signed By: Elige Ko M.D. On: 08/27/2023 15:40  Impression: Degenerative arthrosis of the left knee  Plan: The findings were discussed in detail with the patient. Although the MRI did demonstrate meniscal pathology, there was also evidence of tricompartmental degenerative arthrosis. Arthroscopy is an appropriate treatment for the meniscal pathology, but would have limited or no effect on degenerative changes of the articular cartilage. Conservative treatment options were reviewed with the patient. We discussed the risks and benefits of surgical intervention. The usual perioperative course was also discussed in detail. The patient expressed understanding of the risks and benefits of surgical intervention and would like to proceed with plans for left total knee arthroplasty.  I spent a total of 40 minutes in both face-to-face and non-face-to-face activities, excluding procedures performed, for this visit on the date of this encounter.  MEDICAL CLEARANCE: Per anesthesiology. ACTIVITY: As tolerated. WORK STATUS: Not applicable. THERAPY: Preoperative physical therapy  evaluation. MEDICATIONS: Requested Prescriptions  No prescriptions requested or ordered in this encounter  FOLLOW-UP: No follow-ups on file.  This note will serve as the history and physical for surgery scheduled on October 06, 2023 for a left total knee arthroplasty to be done by Dr. Ernest Pine.  JONATHAN T MUNDY PA-C, M.S.  Dante Gang PA-C, M.S.  This note was generated in part with voice recognition software and I apologize for any typographical errors that were not detected and corrected.  Electronically signed by [name], PA at 09/27/2023 11:01 AM EST

## 2023-10-05 MED ORDER — LACTATED RINGERS IV SOLN: INTRAVENOUS | Status: DC

## 2023-10-05 MED ORDER — GABAPENTIN 300 MG PO CAPS
300.0000 mg | ORAL_CAPSULE | Freq: Once | ORAL | Status: AC
  Administered 2023-10-06: 300 mg via ORAL

## 2023-10-05 MED ORDER — DEXAMETHASONE SODIUM PHOSPHATE 10 MG/ML IJ SOLN
8.0000 mg | Freq: Once | INTRAMUSCULAR | Status: AC
  Administered 2023-10-06: 8 mg via INTRAVENOUS

## 2023-10-05 MED ORDER — CEFAZOLIN SODIUM-DEXTROSE 2-4 GM/100ML-% IV SOLN
2.0000 g | INTRAVENOUS | Status: AC
  Administered 2023-10-06: 2 g via INTRAVENOUS

## 2023-10-05 MED ORDER — TRANEXAMIC ACID-NACL 1000-0.7 MG/100ML-% IV SOLN
1000.0000 mg | INTRAVENOUS | Status: AC
  Administered 2023-10-06: 1000 mg via INTRAVENOUS

## 2023-10-05 MED ORDER — CELECOXIB 200 MG PO CAPS
400.0000 mg | ORAL_CAPSULE | Freq: Once | ORAL | Status: AC
  Administered 2023-10-06: 400 mg via ORAL

## 2023-10-05 MED ORDER — CHLORHEXIDINE GLUCONATE 0.12 % MT SOLN
15.0000 mL | Freq: Once | OROMUCOSAL | Status: AC
  Administered 2023-10-06: 15 mL via OROMUCOSAL

## 2023-10-05 MED ORDER — ORAL CARE MOUTH RINSE: 15.0000 mL | Freq: Once | OROMUCOSAL | Status: AC

## 2023-10-05 MED ORDER — CHLORHEXIDINE GLUCONATE 4 % EX SOLN: 60.0000 mL | Freq: Once | CUTANEOUS | Status: DC

## 2023-10-06 ENCOUNTER — Encounter
Admission: RE | Disposition: A | Discharge status: Home / Self Care | Payer: Self-pay | Source: Home / Self Care | Attending: Orthopedic Surgery

## 2023-10-06 ENCOUNTER — Ambulatory Visit: Payer: Self-pay | Admitting: Urgent Care

## 2023-10-06 ENCOUNTER — Observation Stay

## 2023-10-06 ENCOUNTER — Ambulatory Visit: Admitting: Certified Registered"

## 2023-10-06 ENCOUNTER — Observation Stay
Admission: RE | Admit: 2023-10-06 | Discharge: 2023-10-07 | Disposition: A | Discharge status: Home / Self Care | Payer: Medicare HMO | Attending: Orthopedic Surgery | Admitting: Orthopedic Surgery

## 2023-10-06 ENCOUNTER — Encounter: Payer: Self-pay | Admitting: Orthopedic Surgery

## 2023-10-06 ENCOUNTER — Other Ambulatory Visit: Payer: Self-pay

## 2023-10-06 DIAGNOSIS — M1712 Unilateral primary osteoarthritis, left knee: Principal | ICD-10-CM | POA: Insufficient documentation

## 2023-10-06 DIAGNOSIS — Z96651 Presence of right artificial knee joint: Secondary | ICD-10-CM | POA: Insufficient documentation

## 2023-10-06 DIAGNOSIS — Z96652 Presence of left artificial knee joint: Secondary | ICD-10-CM

## 2023-10-06 DIAGNOSIS — Z8616 Personal history of COVID-19: Secondary | ICD-10-CM | POA: Diagnosis not present

## 2023-10-06 DIAGNOSIS — Z7982 Long term (current) use of aspirin: Secondary | ICD-10-CM | POA: Diagnosis not present

## 2023-10-06 DIAGNOSIS — Z01812 Encounter for preprocedural laboratory examination: Secondary | ICD-10-CM

## 2023-10-06 DIAGNOSIS — Z87891 Personal history of nicotine dependence: Secondary | ICD-10-CM | POA: Diagnosis not present

## 2023-10-06 DIAGNOSIS — Z79899 Other long term (current) drug therapy: Secondary | ICD-10-CM | POA: Diagnosis not present

## 2023-10-06 DIAGNOSIS — D509 Iron deficiency anemia, unspecified: Secondary | ICD-10-CM

## 2023-10-06 HISTORY — PX: KNEE ARTHROPLASTY: SHX992

## 2023-10-06 SURGERY — ARTHROPLASTY, KNEE, TOTAL, USING IMAGELESS COMPUTER-ASSISTED NAVIGATION
Anesthesia: Spinal | Site: Knee | Laterality: Left

## 2023-10-06 MED ORDER — CHLORHEXIDINE GLUCONATE 0.12 % MT SOLN
OROMUCOSAL | Status: AC
  Filled 2023-10-06: qty 15

## 2023-10-06 MED ORDER — PHENOL 1.4 % MT LIQD: 1.0000 | OROMUCOSAL | Status: DC | PRN

## 2023-10-06 MED ORDER — FENTANYL CITRATE (PF) 100 MCG/2ML IJ SOLN
INTRAMUSCULAR | Status: DC | PRN
  Administered 2023-10-06 (×4): 25 ug via INTRAVENOUS

## 2023-10-06 MED ORDER — TRANEXAMIC ACID-NACL 1000-0.7 MG/100ML-% IV SOLN
INTRAVENOUS | Status: AC
  Filled 2023-10-06: qty 100

## 2023-10-06 MED ORDER — OXYCODONE HCL 5 MG PO TABS: 5.0000 mg | ORAL_TABLET | Freq: Once | ORAL | Status: DC | PRN

## 2023-10-06 MED ORDER — OXYCODONE HCL 5 MG PO TABS
5.0000 mg | ORAL_TABLET | ORAL | Status: DC | PRN
  Administered 2023-10-07: 5 mg via ORAL
  Filled 2023-10-06: qty 1

## 2023-10-06 MED ORDER — TRANEXAMIC ACID-NACL 1000-0.7 MG/100ML-% IV SOLN
1000.0000 mg | Freq: Once | INTRAVENOUS | Status: AC
  Administered 2023-10-06: 1000 mg via INTRAVENOUS

## 2023-10-06 MED ORDER — OXYCODONE HCL 5 MG PO TABS
ORAL_TABLET | ORAL | Status: AC
  Filled 2023-10-06: qty 2

## 2023-10-06 MED ORDER — LOSARTAN POTASSIUM 50 MG PO TABS
50.0000 mg | ORAL_TABLET | Freq: Every day | ORAL | Status: DC
  Administered 2023-10-07: 50 mg via ORAL
  Filled 2023-10-06: qty 1

## 2023-10-06 MED ORDER — ONDANSETRON HCL 4 MG PO TABS
4.0000 mg | ORAL_TABLET | Freq: Four times a day (QID) | ORAL | Status: DC | PRN
Start: 2023-10-06 — End: 2023-10-07

## 2023-10-06 MED ORDER — SENNOSIDES-DOCUSATE SODIUM 8.6-50 MG PO TABS
1.0000 | ORAL_TABLET | Freq: Two times a day (BID) | ORAL | Status: DC
  Administered 2023-10-06 – 2023-10-07 (×2): 1 via ORAL
  Filled 2023-10-06 (×2): qty 1

## 2023-10-06 MED ORDER — TRAMADOL HCL 50 MG PO TABS
50.0000 mg | ORAL_TABLET | ORAL | Status: DC | PRN
Start: 2023-10-06 — End: 2023-10-07

## 2023-10-06 MED ORDER — PROPOFOL 500 MG/50ML IV EMUL
INTRAVENOUS | Status: DC | PRN
  Administered 2023-10-06: 100 ug/kg/min via INTRAVENOUS

## 2023-10-06 MED ORDER — FENTANYL CITRATE (PF) 100 MCG/2ML IJ SOLN
25.0000 ug | INTRAMUSCULAR | Status: DC | PRN
  Administered 2023-10-06 (×2): 25 ug via INTRAVENOUS
  Administered 2023-10-06: 50 ug via INTRAVENOUS

## 2023-10-06 MED ORDER — ACETAMINOPHEN 10 MG/ML IV SOLN
INTRAVENOUS | Status: DC | PRN
Start: 2023-10-06 — End: 2023-10-06
  Administered 2023-10-06: 1000 mg via INTRAVENOUS

## 2023-10-06 MED ORDER — FENTANYL CITRATE (PF) 100 MCG/2ML IJ SOLN
INTRAMUSCULAR | Status: AC
  Filled 2023-10-06: qty 2

## 2023-10-06 MED ORDER — LOSARTAN POTASSIUM-HCTZ 50-12.5 MG PO TABS: 1.0000 | ORAL_TABLET | Freq: Every day | ORAL | Status: DC

## 2023-10-06 MED ORDER — ACETAMINOPHEN 10 MG/ML IV SOLN
1000.0000 mg | Freq: Four times a day (QID) | INTRAVENOUS | Status: DC
  Administered 2023-10-06 – 2023-10-07 (×3): 1000 mg via INTRAVENOUS
  Filled 2023-10-06 (×3): qty 100

## 2023-10-06 MED ORDER — OXYCODONE HCL 5 MG PO TABS
10.0000 mg | ORAL_TABLET | ORAL | Status: DC | PRN
Start: 2023-10-06 — End: 2023-10-07
  Administered 2023-10-06 (×2): 10 mg via ORAL
  Filled 2023-10-06: qty 2

## 2023-10-06 MED ORDER — ALUM & MAG HYDROXIDE-SIMETH 200-200-20 MG/5ML PO SUSP
30.0000 mL | ORAL | Status: DC | PRN
Start: 2023-10-06 — End: 2023-10-07

## 2023-10-06 MED ORDER — BISACODYL 10 MG RE SUPP: 10.0000 mg | Freq: Every day | RECTAL | Status: DC | PRN

## 2023-10-06 MED ORDER — HYDROCHLOROTHIAZIDE 12.5 MG PO TABS
12.5000 mg | ORAL_TABLET | Freq: Every day | ORAL | Status: DC
  Administered 2023-10-07: 12.5 mg via ORAL
  Filled 2023-10-06: qty 1

## 2023-10-06 MED ORDER — ONDANSETRON HCL 4 MG/2ML IJ SOLN
INTRAMUSCULAR | Status: AC
  Filled 2023-10-06: qty 2

## 2023-10-06 MED ORDER — DIPHENHYDRAMINE HCL 12.5 MG/5ML PO ELIX: 12.5000 mg | ORAL_SOLUTION | ORAL | Status: DC | PRN

## 2023-10-06 MED ORDER — METOCLOPRAMIDE HCL 10 MG PO TABS
10.0000 mg | ORAL_TABLET | Freq: Three times a day (TID) | ORAL | Status: DC
  Administered 2023-10-06 – 2023-10-07 (×3): 10 mg via ORAL
  Filled 2023-10-06 (×3): qty 1

## 2023-10-06 MED ORDER — MENTHOL 3 MG MT LOZG: 1.0000 | LOZENGE | OROMUCOSAL | Status: DC | PRN

## 2023-10-06 MED ORDER — CEFAZOLIN SODIUM-DEXTROSE 2-4 GM/100ML-% IV SOLN
INTRAVENOUS | Status: AC
  Filled 2023-10-06: qty 100

## 2023-10-06 MED ORDER — SODIUM CHLORIDE (PF) 0.9 % IJ SOLN
INTRAMUSCULAR | Status: DC | PRN
  Administered 2023-10-06: 120 mL via INTRAMUSCULAR

## 2023-10-06 MED ORDER — PROPOFOL 10 MG/ML IV BOLUS
INTRAVENOUS | Status: DC | PRN
  Administered 2023-10-06 (×2): 20 mg via INTRAVENOUS

## 2023-10-06 MED ORDER — FLEET ENEMA RE ENEM
1.0000 | ENEMA | Freq: Once | RECTAL | Status: DC | PRN
Start: 2023-10-06 — End: 2023-10-07

## 2023-10-06 MED ORDER — PROPOFOL 10 MG/ML IV BOLUS
INTRAVENOUS | Status: AC
  Filled 2023-10-06: qty 20

## 2023-10-06 MED ORDER — CEFAZOLIN SODIUM-DEXTROSE 2-4 GM/100ML-% IV SOLN
2.0000 g | Freq: Four times a day (QID) | INTRAVENOUS | Status: AC
  Administered 2023-10-06 – 2023-10-07 (×2): 2 g via INTRAVENOUS
  Filled 2023-10-06: qty 100

## 2023-10-06 MED ORDER — SODIUM CHLORIDE 0.9 % IV SOLN: INTRAVENOUS | Status: DC

## 2023-10-06 MED ORDER — GABAPENTIN 300 MG PO CAPS
ORAL_CAPSULE | ORAL | Status: AC
  Filled 2023-10-06: qty 1

## 2023-10-06 MED ORDER — PAROXETINE HCL 20 MG PO TABS
20.0000 mg | ORAL_TABLET | Freq: Every day | ORAL | Status: DC
  Administered 2023-10-07: 20 mg via ORAL
  Filled 2023-10-06: qty 1

## 2023-10-06 MED ORDER — BUPIVACAINE HCL (PF) 0.5 % IJ SOLN
INTRAMUSCULAR | Status: DC | PRN
  Administered 2023-10-06: 2.8 mL

## 2023-10-06 MED ORDER — CELECOXIB 200 MG PO CAPS
200.0000 mg | ORAL_CAPSULE | Freq: Two times a day (BID) | ORAL | Status: DC
  Administered 2023-10-06 – 2023-10-07 (×2): 200 mg via ORAL
  Filled 2023-10-06 (×2): qty 1

## 2023-10-06 MED ORDER — ONDANSETRON HCL 4 MG/2ML IJ SOLN
INTRAMUSCULAR | Status: DC | PRN
  Administered 2023-10-06: 4 mg via INTRAVENOUS

## 2023-10-06 MED ORDER — FENTANYL CITRATE (PF) 100 MCG/2ML IJ SOLN
INTRAMUSCULAR | Status: AC
Start: 2023-10-06 — End: ?
  Filled 2023-10-06: qty 2

## 2023-10-06 MED ORDER — BUPIVACAINE HCL (PF) 0.25 % IJ SOLN
INTRAMUSCULAR | Status: AC
  Filled 2023-10-06: qty 60

## 2023-10-06 MED ORDER — ACETAMINOPHEN 325 MG PO TABS
325.0000 mg | ORAL_TABLET | Freq: Four times a day (QID) | ORAL | Status: DC | PRN
  Filled 2023-10-06: qty 2

## 2023-10-06 MED ORDER — PROPOFOL 1000 MG/100ML IV EMUL
INTRAVENOUS | Status: AC
  Filled 2023-10-06: qty 100

## 2023-10-06 MED ORDER — MIRTAZAPINE 15 MG PO TABS
15.0000 mg | ORAL_TABLET | Freq: Every day | ORAL | Status: DC
  Administered 2023-10-06: 15 mg via ORAL
  Filled 2023-10-06: qty 1

## 2023-10-06 MED ORDER — HYDROMORPHONE HCL 1 MG/ML IJ SOLN
0.5000 mg | INTRAMUSCULAR | Status: DC | PRN
Start: 2023-10-06 — End: 2023-10-07
  Administered 2023-10-06: 1 mg via INTRAVENOUS

## 2023-10-06 MED ORDER — SODIUM CHLORIDE (PF) 0.9 % IJ SOLN
INTRAMUSCULAR | Status: AC
  Filled 2023-10-06: qty 40

## 2023-10-06 MED ORDER — FERROUS SULFATE 325 (65 FE) MG PO TABS
325.0000 mg | ORAL_TABLET | Freq: Two times a day (BID) | ORAL | Status: DC
  Administered 2023-10-07: 325 mg via ORAL
  Filled 2023-10-06: qty 1

## 2023-10-06 MED ORDER — LAMOTRIGINE 100 MG PO TABS
200.0000 mg | ORAL_TABLET | Freq: Two times a day (BID) | ORAL | Status: DC
  Administered 2023-10-06 – 2023-10-07 (×2): 200 mg via ORAL
  Filled 2023-10-06: qty 2
  Filled 2023-10-06: qty 8
  Filled 2023-10-06: qty 2

## 2023-10-06 MED ORDER — OXYCODONE HCL 5 MG/5ML PO SOLN: 5.0000 mg | Freq: Once | ORAL | Status: DC | PRN

## 2023-10-06 MED ORDER — ENSURE PRE-SURGERY PO LIQD: 296.0000 mL | Freq: Once | ORAL | Status: DC

## 2023-10-06 MED ORDER — HYDROMORPHONE HCL 1 MG/ML IJ SOLN
INTRAMUSCULAR | Status: AC
  Filled 2023-10-06: qty 1

## 2023-10-06 MED ORDER — SURGIPHOR WOUND IRRIGATION SYSTEM - OPTIME: TOPICAL | Status: DC | PRN

## 2023-10-06 MED ORDER — BUPIVACAINE LIPOSOME 1.3 % IJ SUSP
INTRAMUSCULAR | Status: AC
  Filled 2023-10-06: qty 20

## 2023-10-06 MED ORDER — ONDANSETRON HCL 4 MG/2ML IJ SOLN: 4.0000 mg | Freq: Four times a day (QID) | INTRAMUSCULAR | Status: DC | PRN

## 2023-10-06 MED ORDER — PANTOPRAZOLE SODIUM 40 MG PO TBEC
40.0000 mg | DELAYED_RELEASE_TABLET | Freq: Two times a day (BID) | ORAL | Status: DC
  Administered 2023-10-06 – 2023-10-07 (×2): 40 mg via ORAL
  Filled 2023-10-06 (×2): qty 1

## 2023-10-06 MED ORDER — CELECOXIB 200 MG PO CAPS
ORAL_CAPSULE | ORAL | Status: AC
  Filled 2023-10-06: qty 2

## 2023-10-06 MED ORDER — ASPIRIN 81 MG PO CHEW
81.0000 mg | CHEWABLE_TABLET | Freq: Two times a day (BID) | ORAL | Status: DC
  Administered 2023-10-06 – 2023-10-07 (×2): 81 mg via ORAL
  Filled 2023-10-06 (×2): qty 1

## 2023-10-06 MED ORDER — HYOSCYAMINE SULFATE ER 0.375 MG PO TB12: 0.3750 mg | ORAL_TABLET | Freq: Two times a day (BID) | ORAL | Status: DC | PRN

## 2023-10-06 MED ORDER — MAGNESIUM HYDROXIDE 400 MG/5ML PO SUSP
30.0000 mL | Freq: Every day | ORAL | Status: DC
  Administered 2023-10-06 – 2023-10-07 (×2): 30 mL via ORAL
  Filled 2023-10-06 (×2): qty 30

## 2023-10-06 MED ORDER — SODIUM CHLORIDE 0.9 % IR SOLN
Status: DC | PRN
  Administered 2023-10-06: 3000 mL

## 2023-10-06 MED ORDER — FINASTERIDE 1 MG PO TABS: 1.0000 mg | ORAL_TABLET | Freq: Every day | ORAL | Status: DC

## 2023-10-06 MED ORDER — DEXAMETHASONE SODIUM PHOSPHATE 10 MG/ML IJ SOLN
INTRAMUSCULAR | Status: AC
  Filled 2023-10-06: qty 1

## 2023-10-06 SURGICAL SUPPLY — 65 items
ATTUNE MED DOME PAT 32 KNEE (Knees) IMPLANT
ATTUNE PS FEM LT SZ 4 CEM KNEE (Femur) IMPLANT
ATTUNE PSRP INSR SZ4 10 KNEE (Insert) IMPLANT
BASEPLATE TIBIAL ROTATING SZ 4 (Knees) IMPLANT
BATTERY INSTRU NAVIGATION (MISCELLANEOUS) ×4 IMPLANT
BIT DRILL QUICK REL 1/8 2PK SL (BIT) ×1 IMPLANT
BLADE CLIPPER SURG (BLADE) IMPLANT
BLADE SAW 70X12.5 (BLADE) ×1 IMPLANT
BLADE SAW 90X13X1.19 OSCILLAT (BLADE) ×1 IMPLANT
BLADE SAW 90X25X1.19 OSCILLAT (BLADE) ×1 IMPLANT
BONE CEMENT GENTAMICIN (Cement) ×2 IMPLANT
BRUSH SCRUB EZ PLAIN DRY (MISCELLANEOUS) ×1 IMPLANT
CEMENT BONE GENTAMICIN 40 (Cement) IMPLANT
COOLER POLAR GLACIER W/PUMP (MISCELLANEOUS) ×1 IMPLANT
CUFF TRNQT CYL 24X4X16.5-23 (TOURNIQUET CUFF) IMPLANT
CUFF TRNQT CYL 30X4X21-28X (TOURNIQUET CUFF) IMPLANT
DRAPE SHEET LG 3/4 BI-LAMINATE (DRAPES) ×1 IMPLANT
DRSG AQUACEL AG ADV 3.5X14 (GAUZE/BANDAGES/DRESSINGS) ×1 IMPLANT
DRSG MEPILEX SACRM 8.7X9.8 (GAUZE/BANDAGES/DRESSINGS) ×1 IMPLANT
DRSG TEGADERM 4X4.75 (GAUZE/BANDAGES/DRESSINGS) ×1 IMPLANT
DURAPREP 26ML APPLICATOR (WOUND CARE) ×2 IMPLANT
ELECT CAUTERY BLADE 6.4 (BLADE) ×1 IMPLANT
ELECT REM PT RETURN 9FT ADLT (ELECTROSURGICAL) ×1 IMPLANT
ELECTRODE REM PT RTRN 9FT ADLT (ELECTROSURGICAL) ×1 IMPLANT
EVACUATOR 1/8 PVC DRAIN (DRAIN) ×1 IMPLANT
EX-PIN ORTHOLOCK NAV 4X150 (PIN) ×2 IMPLANT
GAUZE XEROFORM 1X8 LF (GAUZE/BANDAGES/DRESSINGS) ×1 IMPLANT
GLOVE BIOGEL M STRL SZ7.5 (GLOVE) ×6 IMPLANT
GLOVE SURG UNDER POLY LF SZ8 (GLOVE) ×2 IMPLANT
GOWN STRL REUS W/ TWL LRG LVL3 (GOWN DISPOSABLE) ×1 IMPLANT
GOWN STRL REUS W/ TWL XL LVL3 (GOWN DISPOSABLE) ×1 IMPLANT
GOWN TOGA ZIPPER T7+ PEEL AWAY (MISCELLANEOUS) ×1 IMPLANT
HOLDER FOLEY CATH W/STRAP (MISCELLANEOUS) ×1 IMPLANT
HOOD PEEL AWAY T7 (MISCELLANEOUS) ×1 IMPLANT
IV NS IRRIG 3000ML ARTHROMATIC (IV SOLUTION) ×1 IMPLANT
KIT TURNOVER KIT A (KITS) ×1 IMPLANT
KNIFE SCULPS 14X20 (INSTRUMENTS) ×1 IMPLANT
MANIFOLD NEPTUNE II (INSTRUMENTS) ×2 IMPLANT
NDL SPNL 20GX3.5 QUINCKE YW (NEEDLE) ×2 IMPLANT
NEEDLE SPNL 20GX3.5 QUINCKE YW (NEEDLE) ×2 IMPLANT
PACK TOTAL KNEE (MISCELLANEOUS) ×1 IMPLANT
PAD ABD DERMACEA PRESS 5X9 (GAUZE/BANDAGES/DRESSINGS) ×2 IMPLANT
PAD ARMBOARD 7.5X6 YLW CONV (MISCELLANEOUS) ×3 IMPLANT
PAD WRAPON POLAR KNEE (MISCELLANEOUS) ×1 IMPLANT
PENCIL SMOKE EVACUATOR COATED (MISCELLANEOUS) ×1 IMPLANT
PIN DRILL FIX HALF THREAD (BIT) ×2 IMPLANT
PIN FIXATION 1/8DIA X 3INL (PIN) ×1 IMPLANT
PULSAVAC PLUS IRRIG FAN TIP (DISPOSABLE) ×1 IMPLANT
SOLUTION IRRIG SURGIPHOR (IV SOLUTION) ×1 IMPLANT
SPONGE DRAIN TRACH 4X4 STRL 2S (GAUZE/BANDAGES/DRESSINGS) ×1 IMPLANT
STAPLER SKIN PROX 35W (STAPLE) ×1 IMPLANT
STOCKINETTE STRL BIAS CUT 8X4 (MISCELLANEOUS) ×1 IMPLANT
STRAP TIBIA SHORT (MISCELLANEOUS) ×1 IMPLANT
SUCTION TUBE FRAZIER 10FR DISP (SUCTIONS) ×1 IMPLANT
SUT VIC AB 0 CT1 36 (SUTURE) ×1 IMPLANT
SUT VIC AB 1 CT1 36 (SUTURE) ×2 IMPLANT
SUT VIC AB 2-0 CT2 27 (SUTURE) ×1 IMPLANT
SYR 30ML LL (SYRINGE) ×2 IMPLANT
TIP FAN IRRIG PULSAVAC PLUS (DISPOSABLE) ×1 IMPLANT
TOWEL OR 17X26 4PK STRL BLUE (TOWEL DISPOSABLE) ×1 IMPLANT
TOWER CARTRIDGE SMART MIX (DISPOSABLE) ×1 IMPLANT
TRAP FLUID SMOKE EVACUATOR (MISCELLANEOUS) ×1 IMPLANT
TRAY FOLEY MTR SLVR 16FR STAT (SET/KITS/TRAYS/PACK) ×1 IMPLANT
WATER STERILE IRR 1000ML POUR (IV SOLUTION) ×1 IMPLANT
WRAPON POLAR PAD KNEE (MISCELLANEOUS) ×1 IMPLANT

## 2023-10-06 NOTE — Progress Notes (Signed)
 Patient is not able to walk the distance required to go the bathroom, or he/she is unable to safely negotiate stairs required to access the bathroom.  A 3in1 BSC will alleviate this problem   Amenda Duclos P. Angie Fava M.D.

## 2023-10-06 NOTE — Plan of Care (Signed)
 Verbalizes understanding of procedure and discharge instructions and follow up

## 2023-10-06 NOTE — Transfer of Care (Signed)
 Immediate Anesthesia Transfer of Care Note  Patient: Sharlene Dory  Procedure(s) Performed: ARTHROPLASTY, KNEE, TOTAL, USING IMAGELESS COMPUTER-ASSISTED NAVIGATION (Left: Knee)  Patient Location: PACU  Anesthesia Type:Spinal  Level of Consciousness: awake and drowsy  Airway & Oxygen Therapy: Patient Spontanous Breathing and Patient connected to face mask oxygen  Post-op Assessment: Report given to RN and Post -op Vital signs reviewed and stable  Post vital signs: Reviewed and stable  Last Vitals:  Vitals Value Taken Time  BP 153/87 10/06/23 1545  Temp 35.8 1545  Pulse 88 10/06/23 1547  Resp 16 10/06/23 1547  SpO2 100 % 10/06/23 1547  Vitals shown include unfiled device data.  Last Pain:  Vitals:   10/06/23 0919  TempSrc: Temporal  PainSc: 10-Worst pain ever         Complications: No notable events documented.

## 2023-10-06 NOTE — Anesthesia Preprocedure Evaluation (Signed)
 Anesthesia Evaluation  Patient identified by MRN, date of birth, ID band Patient awake    Reviewed: Allergy & Precautions, NPO status , Patient's Chart, lab work & pertinent test results  History of Anesthesia Complications Negative for: history of anesthetic complications  Airway Mallampati: I  TM Distance: >3 FB Neck ROM: Full    Dental  (+) Implants   Pulmonary neg pulmonary ROS   Pulmonary exam normal breath sounds clear to auscultation       Cardiovascular Exercise Tolerance: Good hypertension, On Medications negative cardio ROS Normal cardiovascular exam Rhythm:Regular Rate:Normal     Neuro/Psych Seizures - (x1, 2009), Well Controlled,  PSYCHIATRIC DISORDERS Anxiety     negative neurological ROS  negative psych ROS   GI/Hepatic negative GI ROS, Neg liver ROS,,,  Endo/Other  negative endocrine ROS    Renal/GU      Musculoskeletal   Abdominal   Peds  Hematology negative hematology ROS (+) Skin BCC   Anesthesia Other Findings Past Medical History: No date: Anxiety No date: Arthritis No date: Back pain No date: Complex partial seizures (HCC)     Comment:  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 (last one 2011) No date: Diverticulosis No date: Essential hypertension No date: IBS (irritable bowel syndrome) No date: Iron deficiency anemia No date: Mixed hyperlipidemia 12/21/2007: Osteopenia No date: Primary osteoarthritis of left knee No date: Seizures (HCC)     Comment:  1 in 2010 No date: Vitamin B12 deficiency No date: Vitamin D deficiency  Past Surgical History: 1998: AUGMENTATION MAMMAPLASTY; Bilateral     Comment:  removed 2013 1983: BREAST SURGERY     Comment:  bilateral implants 11/18/2020: CATARACT EXTRACTION W/PHACO; Right     Comment:  Procedure: CATARACT EXTRACTION PHACO AND INTRAOCULAR               LENS PLACEMENT (IOC) RIGHT PANOPTIX LENS 3.49 00:29.8;                Surgeon: Nevada Crane, MD;  Location: [facility]               [facility];  Service: Ophthalmology;  Laterality:               Right; 12/02/2020: CATARACT EXTRACTION W/PHACO; Left     Comment:  Procedure: CATARACT EXTRACTION PHACO AND INTRAOCULAR               LENS PLACEMENT (IOC) LEFT;  Surgeon: Nevada Crane,               MD;  Location: [facility];  Service:               Ophthalmology;  Laterality: Left;  2.69 00:23.4 05/13/2016: CHONDROPLASTY; Right     Comment:  Procedure: CHONDROPLASTY;  Surgeon: Donato Heinz, MD;               Location: [facility];  Service: Orthopedics;  Laterality:               Right; 05/01/2005: COLONOSCOPY 03/26/2020: COLONOSCOPY WITH ESOPHAGOGASTRODUODENOSCOPY (EGD) No date: FOOT SURGERY; Bilateral     Comment:  straightening toes (4th & 5th) with pins and then had               the pins removed 1999: GYNECOLOGIC CRYOSURGERY 04/26/2019: KNEE ARTHROPLASTY; Right     Comment:  Procedure: COMPUTER ASSISTED TOTAL KNEE ARTHROPLASTY;  Surgeon: Donato Heinz, MD;  Location: [facility];                Service: Orthopedics;  Laterality: Right; 05/13/2016: KNEE ARTHROSCOPY; Right     Comment:  Procedure: ARTHROSCOPY KNEE WITH MEDIAL AND LATERAL               MENISECTOMY;  Surgeon: Donato Heinz, MD;  Location:               [facility];  Service: Orthopedics;  Laterality: Right; 2007: LASIK No date: MOUTH SURGERY     Comment:  DENTAL IMPLANTS (TOP) 2011: ROTATOR CUFF REPAIR; Left No date: VAGINAL DELIVERY     Comment:  x 1  BMI    Body Mass Index: 31.64 kg/m      Reproductive/Obstetrics negative OB ROS                             Anesthesia Physical Anesthesia Plan  ASA: 2  Anesthesia Plan: Spinal and General/Spinal   Post-op Pain Management:    Induction:   PONV Risk Score and Plan: 3 and Ondansetron, Propofol infusion, TIVA and Midazolam  Airway Management Planned: Natural Airway and Nasal  Cannula  Additional Equipment:   Intra-op Plan:   Post-operative Plan:   Informed Consent: I have reviewed the patients History and Physical, chart, labs and discussed the procedure including the risks, benefits and alternatives for the proposed anesthesia with the patient or authorized representative who has indicated his/her understanding and acceptance.     Dental Advisory Given  Plan Discussed with: Anesthesiologist, CRNA and Surgeon  Anesthesia Plan Comments: (Patient reports no bleeding problems and no anticoagulant use.  Plan for spinal with backup GA  Patient consented for risks of anesthesia including but not limited to:  - adverse reactions to medications - damage to eyes, teeth, lips or other oral mucosa - nerve damage due to positioning  - risk of bleeding, infection and or nerve damage from spinal that could lead to paralysis - risk of headache or failed spinal - damage to teeth, lips or other oral mucosa - sore throat or hoarseness - damage to heart, brain, nerves, lungs, other parts of body or loss of life  Patient voiced understanding and assent.)       Anesthesia Quick Evaluation

## 2023-10-06 NOTE — Anesthesia Postprocedure Evaluation (Signed)
 Anesthesia Post Note  Patient: Lisa Yu  Procedure(s) Performed: ARTHROPLASTY, KNEE, TOTAL, USING IMAGELESS COMPUTER-ASSISTED NAVIGATION (Left: Knee)  Patient location during evaluation: PACU Anesthesia Type: Spinal Level of consciousness: awake and alert Pain management: pain level controlled Vital Signs Assessment: post-procedure vital signs reviewed and stable Respiratory status: spontaneous breathing, nonlabored ventilation, respiratory function stable and patient connected to nasal cannula oxygen Cardiovascular status: blood pressure returned to baseline and stable Postop Assessment: no apparent nausea or vomiting Anesthetic complications: no   No notable events documented.   Last Vitals:  Vitals:   10/06/23 0919  BP: (!) 154/71  Pulse: 79  Resp: 17  Temp: 37.1 C  SpO2: 100%    Last Pain:  Vitals:   10/06/23 0919  TempSrc: Temporal  PainSc: 10-Worst pain ever                 Yevette Edwards

## 2023-10-06 NOTE — Op Note (Signed)
 OPERATIVE NOTE  DATE OF SURGERY:  10/06/2023  PATIENT NAME:  MARCEY PERSAD   DOB: 1950-11-01  MRN: [id number]  PRE-OPERATIVE DIAGNOSIS: Degenerative arthrosis of the left knee, primary  POST-OPERATIVE DIAGNOSIS:  Same  PROCEDURE:  Left total knee arthroplasty using computer-assisted navigation  SURGEON:  Jena Gauss. M.D.  ASSISTANT:  Gean Birchwood, PA-C (present and scrubbed throughout the case, critical for assistance with exposure, retraction, instrumentation, and closure)  ANESTHESIA: spinal  ESTIMATED BLOOD LOSS: 50 mL  FLUIDS REPLACED: 700 mL of crystalloid  TOURNIQUET TIME: 111 minutes  DRAINS: 2 medium Hemovac drains  SOFT TISSUE RELEASES: Anterior cruciate ligament, posterior cruciate ligament, deep medial collateral ligament, patellofemoral ligament  IMPLANTS UTILIZED: DePuy Attune size 4 posterior stabilized femoral component (cemented), size 4 rotating platform tibial component (cemented), 32 mm medialized dome patella (cemented), and a 10 mm stabilized rotating platform polyethylene insert.  INDICATIONS FOR SURGERY: ONDINE GEMME is a 73 y.o. year old female with a long history of progressive knee pain. X-rays demonstrated severe degenerative changes in tricompartmental fashion. The patient had not seen any significant improvement despite conservative nonsurgical intervention. After discussion of the risks and benefits of surgical intervention, the patient expressed understanding of the risks benefits and agree with plans for total knee arthroplasty.   The risks, benefits, and alternatives were discussed at length including but not limited to the risks of infection, bleeding, nerve injury, stiffness, blood clots, the need for revision surgery, cardiopulmonary complications, among others, and they were willing to proceed.  PROCEDURE IN DETAIL: The patient was brought into the operating room and, after adequate spinal anesthesia was achieved, a tourniquet was  placed on the patient's upper thigh. The patient's knee and leg were cleaned and prepped with alcohol and DuraPrep and draped in the usual sterile fashion. A "timeout" was performed as per usual protocol. The lower extremity was exsanguinated using an Esmarch, and the tourniquet was inflated to 300 mmHg. An anterior longitudinal incision was made followed by a standard mid vastus approach. The deep fibers of the medial collateral ligament were elevated in a subperiosteal fashion off of the medial flare of the tibia so as to maintain a continuous soft tissue sleeve. The patella was subluxed laterally and the patellofemoral ligament was incised. Inspection of the knee demonstrated severe degenerative changes with full-thickness loss of articular cartilage. Osteophytes were debrided using a rongeur. Anterior and posterior cruciate ligaments were excised. Two 4.0 mm Schanz pins were inserted in the femur and into the tibia for attachment of the array of trackers used for computer-assisted navigation. Hip center was identified using a circumduction technique. Distal landmarks were mapped using the computer. The distal femur and proximal tibia were mapped using the computer. The distal femoral cutting guide was positioned using computer-assisted navigation so as to achieve a 5 distal valgus cut. The femur was sized and it was felt that a size 4 femoral component was appropriate. A size 4 femoral cutting guide was positioned and the anterior cut was performed and verified using the computer. This was followed by completion of the posterior and chamfer cuts. Femoral cutting guide for the central box was then positioned in the center box cut was performed.  Attention was then directed to the proximal tibia. Medial and lateral menisci were excised. The extramedullary tibial cutting guide was positioned using computer-assisted navigation so as to achieve a 0 varus-valgus alignment and 3 posterior slope. The cut was  performed and verified using the computer. The proximal tibia  was sized and it was felt that a size 4 tibial tray was appropriate. Tibial and femoral trials were inserted followed by insertion of a 10 mm polyethylene insert. This allowed for excellent mediolateral soft tissue balancing both in flexion and in full extension. Finally, the patella was cut and prepared so as to accommodate a 32 mm medialized dome patella. A patella trial was placed and the knee was placed through a range of motion with excellent patellar tracking appreciated. The femoral trial was removed after debridement of posterior osteophytes. The central post-hole for the tibial component was reamed followed by insertion of a keel punch. Tibial trials were then removed. Cut surfaces of bone were irrigated with copious amounts of normal saline using pulsatile lavage and then suctioned dry. Polymethylmethacrylate cement with gentamicin was prepared in the usual fashion using a vacuum mixer. Cement was applied to the cut surface of the proximal tibia as well as along the undersurface of a size 4 rotating platform tibial component. Tibial component was positioned and impacted into place. Excess cement was removed using Personal assistant. Cement was then applied to the cut surfaces of the femur as well as along the posterior flanges of the size 4 femoral component. The femoral component was positioned and impacted into place. Excess cement was removed using Personal assistant. A 10 mm polyethylene trial was inserted and the knee was brought into full extension with steady axial compression applied. Finally, cement was applied to the backside of a 32 mm medialized dome patella and the patellar component was positioned and patellar clamp applied. Excess cement was removed using Personal assistant. After adequate curing of the cement, the tourniquet was deflated after a total tourniquet time of 111 minutes. Hemostasis was achieved using electrocautery. The knee  was irrigated with copious amounts of normal saline using pulsatile lavage followed by 450 ml of Surgiphor and then suctioned dry. 20 mL of 1.3% Exparel and 60 mL of 0.25% Marcaine in 40 mL of normal saline was injected along the posterior capsule, medial and lateral gutters, and along the arthrotomy site. A 10 mm stabilized rotating platform polyethylene insert was inserted and the knee was placed through a range of motion with excellent mediolateral soft tissue balancing appreciated and excellent patellar tracking noted. 2 medium drains were placed in the wound bed and brought out through separate stab incisions. The medial parapatellar portion of the incision was reapproximated using interrupted sutures of #1 Vicryl. Subcutaneous tissue was approximated in layers using first #0 Vicryl followed #2-0 Vicryl. The skin was approximated with skin staples. A sterile dressing was applied.  The patient tolerated the procedure well and was transported to the recovery room in stable condition.    Hayat Warbington P. Angie Fava., M.D.

## 2023-10-06 NOTE — Interval H&P Note (Signed)
 History and Physical Interval Note:  10/06/2023 11:10 AM  Lisa Yu  has presented today for surgery, with the diagnosis of PRIMARY OSTEOARTHRTITIS OF LEFT KNEE..  The various methods of treatment have been discussed with the patient and family. After consideration of risks, benefits and other options for treatment, the patient has consented to  Procedure(s): ARTHROPLASTY, KNEE, TOTAL, USING IMAGELESS COMPUTER-ASSISTED NAVIGATION (Left) as a surgical intervention.  The patient's history has been reviewed, patient examined, no change in status, stable for surgery.  I have reviewed the patient's chart and labs.  Questions were answered to the patient's satisfaction.     Lisa Yu

## 2023-10-06 NOTE — Anesthesia Procedure Notes (Signed)
 Spinal  Patient location during procedure: OR Start time: 10/06/2023 11:41 AM End time: 10/06/2023 11:53 AM Reason for block: surgical anesthesia Staffing Performed by: Elmarie Mainland, CRNA Authorized by: Stephanie Coup, MD   Preanesthetic Checklist Completed: patient identified, IV checked, site marked, risks and benefits discussed, surgical consent, monitors and equipment checked, pre-op evaluation and timeout performed Spinal Block Patient position: sitting Prep: DuraPrep Patient monitoring: heart rate, cardiac monitor, continuous pulse ox and blood pressure Approach: midline Location: L3-4 Injection technique: single-shot Needle Needle type: Sprotte  Needle gauge: 24 G Needle length: 9 cm Assessment Sensory level: T4 Events: CSF return

## 2023-10-06 NOTE — Progress Notes (Signed)
 PT Cancellation Note  Patient Details Name: Lisa Yu MRN: [id number] DOB: February 26, 1951   Cancelled Treatment:    Reason Eval/Treat Not Completed: Patient not medically ready Chart reviewed, called PACU.  Nursing reports pt is not ready for PT at this time, will plan to initiate PT eval tomorrow AM, POD1.    Malachi Pro 10/06/2023, 4:54 PM

## 2023-10-06 NOTE — Progress Notes (Signed)
 Patient awake/alert x4.  Moving bil lower ext without event, pulses intact. Patient sleeping, awakens and asking for pain medication: states pain 8/10 on pain scale. Medicated with IV pain medication: will then fall into a sleep needing support of oxygen. Will continue to monitor and discuss oral pain medications. Eating at present. Family updated.

## 2023-10-06 NOTE — Progress Notes (Signed)
 Patient awake/alet x4. Upon arrival to pacu patient moving bilateral lower ext. Sensation intact. Pulses +2. Within approx noted irregular movement of eyes, tears noted, hand grasps weak, speech "slow" patient with a history of seizures. Only lasted less then two minutes. Anesthesia called:  Dr. Pernell Dupre. No new orders . Continue to monitor.

## 2023-10-06 NOTE — Anesthesia Procedure Notes (Signed)
 Procedure Name: MAC Date/Time: 10/06/2023 12:00 PM  Performed by: Cheral Bay, CRNAPre-anesthesia Checklist: Patient identified, Emergency Drugs available, Suction available, Patient being monitored and Timeout performed Patient Re-evaluated:Patient Re-evaluated prior to induction Oxygen Delivery Method: Nasal cannula Induction Type: IV induction Placement Confirmation: positive ETCO2 and CO2 detector

## 2023-10-07 ENCOUNTER — Other Ambulatory Visit: Payer: Self-pay

## 2023-10-07 DIAGNOSIS — M1712 Unilateral primary osteoarthritis, left knee: Secondary | ICD-10-CM | POA: Diagnosis not present

## 2023-10-07 MED ORDER — ASPIRIN 81 MG PO TABS: 81.0000 mg | ORAL_TABLET | Freq: Two times a day (BID) | ORAL | Status: AC

## 2023-10-07 MED ORDER — CELECOXIB 200 MG PO CAPS: 200.0000 mg | ORAL_CAPSULE | Freq: Two times a day (BID) | ORAL | 1 refills | Status: AC

## 2023-10-07 MED ORDER — OXYCODONE HCL 5 MG PO TABS: 5.0000 mg | ORAL_TABLET | ORAL | 0 refills | Status: DC | PRN

## 2023-10-07 MED ORDER — TRAMADOL HCL 50 MG PO TABS
50.0000 mg | ORAL_TABLET | ORAL | 0 refills | Status: AC | PRN
Start: 2023-10-07 — End: ?

## 2023-10-07 NOTE — Progress Notes (Signed)
 Subjective: 1 Day Post-Op Procedure(s) (LRB): ARTHROPLASTY, KNEE, TOTAL, USING IMAGELESS COMPUTER-ASSISTED NAVIGATION (Left) Patient reports pain as mild to none Patient seen in rounds with Dr. Ernest Pine. Patient is well, and has had no acute complaints or problems. Denies any CP, SOB, N/V, fevers or chills We will start therapy today.  Plan is to go Home after hospital stay.  Objective: Vital signs in last 24 hours: Temp:  [97.4 F (36.3 C)-98.8 F (37.1 C)] 98 F (36.7 C) (03/13 0727) Pulse Rate:  [68-108] 68 (03/13 0727) Resp:  [13-20] 15 (03/13 0727) BP: (120-158)/(63-95) 127/70 (03/13 0727) SpO2:  [90 %-100 %] 98 % (03/13 0727) Weight:  [90.3 kg] 90.3 kg (03/12 0919)  Intake/Output from previous day:  Intake/Output Summary (Last 24 hours) at 10/07/2023 0829 Last data filed at 10/07/2023 0419 Gross per 24 hour  Intake 1931.95 ml  Output 570 ml  Net 1361.95 ml    Intake/Output this shift: No intake/output data recorded.  Labs: No results for input(s): "HGB" in the last 72 hours. No results for input(s): "WBC", "RBC", "HCT", "PLT" in the last 72 hours. No results for input(s): "NA", "K", "CL", "CO2", "BUN", "CREATININE", "GLUCOSE", "CALCIUM" in the last 72 hours. No results for input(s): "LABPT", "INR" in the last 72 hours.  EXAM General - Patient is Alert, Appropriate, and Oriented Extremity - Neurologically intact Neurovascular intact Sensation intact distally Intact pulses distally Dorsiflexion/Plantar flexion intact No cellulitis present Compartment soft Dressing - dressing C/D/I and no drainage Motor Function - intact, moving foot and toes well on exam.  JP Drain pulled without difficulty. 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   Weight as of this encounter: 90.3 kg. Advance diet Up with therapy  Patient will continue to work with physical therapy to pass postoperative PT protocols, ROM and strengthening  Discussed with the patient continuing to utilize Polar Care  Patient will use bone foam in 20-30 minute intervals  Patient will wear TED hose bilaterally to help prevent DVT and clot formation  Discussed the Aquacel bandage.  This bandage will stay in place 7 days postoperatively.  Can be replaced with honeycomb bandages that will be sent home with the patient  Discussed sending the patient home with tramadol and oxycodone for as needed pain management.  Patient will also be sent home with Celebrex to help with swelling and inflammation.  Patient will take an 81 mg aspirin twice daily for DVT prophylaxis  JP drain removed without difficulty, intact  Weight-Bearing as tolerated to left leg  Patient will follow-up with [facility] in 2 weeks for staple removal and reevaluation  Rayburn Go, PA-C [facility]  10/07/2023, 8:29 AM

## 2023-10-07 NOTE — Evaluation (Signed)
 Physical Therapy Evaluation Patient Details Name: Lisa Yu MRN: [id number] DOB: Jan 14, 1951 Today's Date: 10/07/2023  History of Present Illness  73 y/o female s/p L TKA on 3/12. PMH significant for seizures, COVID-19, ILD, MDD, cognitive impairment, R TKA, L rotator cuff repair  Clinical Impression  Pt very pleasant and motivated to work with PT.  She showed good effort and understanding with exercises and progression of activity going forward.  She showed great quad control and general strength/ROM in the surgical LE (AROM <90 flexion).  Pt easily and safety managed a 200 ft walk and negotiated up/down steps with retro walker strategy.  Pt has met or exceeded POD1 expectations, will benefit from continued PT per TKA protocols.       If plan is discharge home, recommend the following: Assistance with cooking/housework;Assist for transportation;Help with stairs or ramp for entrance   Can travel by private vehicle        Equipment Recommendations Rolling walker (2 wheels)  Recommendations for Other Services       Functional Status Assessment Patient has had a recent decline in their functional status and demonstrates the ability to make significant improvements in function in a reasonable and predictable amount of time.     Precautions / Restrictions Precautions Precautions: Fall Restrictions Weight Bearing Restrictions Per Provider Order: Yes LLE Weight Bearing Per Provider Order: Weight bearing as tolerated      Mobility  Bed Mobility Overal bed mobility: Modified Independent             General bed mobility comments: use of bed rails and HOB elevated    Transfers Overall transfer level: Needs assistance Equipment used: Rolling walker (2 wheels) Transfers: Sit to/from Stand, Bed to chair/wheelchair/BSC Sit to Stand: Supervision, Contact guard assist           General transfer comment: Minimal cuing for appropriate set up and sequencing, able to rise to  standing multiple times t/o session w/o direct assist    Ambulation/Gait Ambulation/Gait assistance: Supervision Gait Distance (Feet): 200 Feet Assistive device: Rolling walker (2 wheels)         General Gait Details: Pt with expected initial guarding/hesitation, but with cuing for reciprocal gait and appropriate AD use she showed steadily increasing cadence and confidence.  Stairs Stairs: Yes Stairs assistance: Supervision Stair Management: No rails, Backwards, With walker Number of Stairs: 4 General stair comments: After initial cuing for strategy/sequencing pt was able to navigate up/down steps with only minimal assist with walker  Wheelchair Mobility     Tilt Bed    Modified Rankin (Stroke Patients Only)       Balance Overall balance assessment: History of Falls, Needs assistance Sitting-balance support: Feet supported, No upper extremity supported Sitting balance-Leahy Scale: Good     Standing balance support: Bilateral upper extremity supported Standing balance-Leahy Scale: Good Standing balance comment: no LOBs or unsteadiness with prolonged standing acts                             Pertinent Vitals/Pain Pain Assessment Pain Assessment: 0-10 Pain Score: 6  Pain Location: L knee    Home Living Family/patient expects to be discharged to:: Private residence Living Arrangements: Spouse/significant other;Children Available Help at Discharge: Family;Available PRN/intermittently Type of Home: House Home Access: Stairs to enter Entrance Stairs-Rails: None Entrance Stairs-Number of Steps: 2   Home Layout: Two level;Able to live on main level with bedroom/bathroom Home Equipment: Grab bars - toilet;Grab  bars - tub/shower;Hand held shower head      Prior Function Prior Level of Function : Independent/Modified Independent;History of Falls (last six months)             Mobility Comments: 2 falls in the past six months, no AD at baseline ADLs  Comments: independent     Extremity/Trunk Assessment   Upper Extremity Assessment Upper Extremity Assessment: Overall WFL for tasks assessed    Lower Extremity Assessment Lower Extremity Assessment:  (functional t/o, expected post op weakness) LLE Deficits / Details: Able to perform straight leg raise without difficulties    Cervical / Trunk Assessment Cervical / Trunk Assessment: Normal  Communication   Communication Communication: No apparent difficulties    Cognition Arousal: Alert Behavior During Therapy: WFL for tasks assessed/performed                             Following commands: Intact       Cueing Cueing Techniques: Verbal cues     General Comments General comments (skin integrity, edema, etc.): Dressing site dry and intact start/end of session, TED hose on BLE    Exercises Total Joint Exercises Ankle Circles/Pumps: AROM, 10 reps Quad Sets: Strengthening, 10 reps Short Arc Quad: Strengthening, 10 reps Heel Slides: Strengthening, AROM, 10 reps (resisted leg ext) Hip ABduction/ADduction: Strengthening, 10 reps Straight Leg Raises: AROM, 10 reps Knee Flexion: PROM, 5 reps Goniometric ROM: 0-96 (AROM to 92)   Assessment/Plan    PT Assessment Patient needs continued PT services  PT Problem List Decreased strength;Decreased activity tolerance;Decreased range of motion;Decreased balance;Decreased mobility;Decreased knowledge of use of DME;Decreased safety awareness;Pain       PT Treatment Interventions      PT Goals (Current goals can be found in the Care Plan section)  Acute Rehab PT Goals Patient Stated Goal: go home today PT Goal Formulation: With patient Time For Goal Achievement: 10/20/23 Potential to Achieve Goals: Good    Frequency BID     Co-evaluation               AM-PAC PT "6 Clicks" Mobility  Outcome Measure Help needed turning from your back to your side while in a flat bed without using bedrails?: None Help  needed moving from lying on your back to sitting on the side of a flat bed without using bedrails?: None Help needed moving to and from a bed to a chair (including a wheelchair)?: None Help needed standing up from a chair using your arms (e.g., wheelchair or bedside chair)?: None Help needed to walk in hospital room?: None Help needed climbing 3-5 steps with a railing? : A Little 6 Click Score: 23    End of Session Equipment Utilized During Treatment: Gait belt Activity Tolerance: Patient tolerated treatment well Patient left: with call bell/phone within reach Nurse Communication: Mobility status PT Visit Diagnosis: Muscle weakness (generalized) (M62.81);Difficulty in walking, not elsewhere classified (R26.2);Pain Pain - Right/Left: Left Pain - part of body: Knee    Time: 1610-9604 PT Time Calculation (min) (ACUTE ONLY): 35 min   Charges:   PT Evaluation $PT Eval Low Complexity: 1 Low PT Treatments $Gait Training: 8-22 mins $Therapeutic Exercise: 8-22 mins PT General Charges $$ ACUTE PT VISIT: 1 Visit         Malachi Pro, DPT 10/07/2023, 11:33 AM

## 2023-10-07 NOTE — Discharge Summary (Signed)
 Physician Discharge Summary  Subjective: 1 Day Post-Op Procedure(s) (LRB): ARTHROPLASTY, KNEE, TOTAL, USING IMAGELESS COMPUTER-ASSISTED NAVIGATION (Left) Patient reports pain as mild to none Patient seen in rounds with Dr. Ernest Pine. Patient is well, and has had no acute complaints or problems. Denies any CP, SOB, N/V, fevers or chills We will start therapy today.  Patient is ready to go home  Physician Discharge Summary  Patient ID: Lisa Yu MRN: [id number] DOB/AGE: 03-Feb-1951 73 y.o.  Admit date: 10/06/2023 Discharge date: 10/07/2023  Admission Diagnoses:  Discharge Diagnoses:  Principal Problem:   History of total knee arthroplasty, left   Discharged Condition: good  Hospital Course: Patient presented to the hospital on 10/06/2023 for an elective left total knee arthroplasty performed by Dr. Ernest Pine. Patient was given 1g of TXA and 2g of Ancef prior to the procedure. she tolerated the procedure well without any complications. See procedural note below for details. Postoperatively, the patient did very well. she was able to pass PT protocols on post-op day one without any issues. JP drain was removed without any difficulty and was intact. she was able to void her bladder without any difficulty. Physical exam was unremarkable. she denies any SOB, CP, N/V, fevers or chills. Vital signs are stable. Patient is stable to discharge home.   PROCEDURE:  Left total knee arthroplasty using computer-assisted navigation   SURGEON:  Jena Gauss. M.D.   ASSISTANT:  Gean Birchwood, PA-C (present and scrubbed throughout the case, critical for assistance with exposure, retraction, instrumentation, and closure)   ANESTHESIA: spinal   ESTIMATED BLOOD LOSS: 50 mL   FLUIDS REPLACED: 700 mL of crystalloid   TOURNIQUET TIME: 111 minutes   DRAINS: 2 medium Hemovac drains   SOFT TISSUE RELEASES: Anterior cruciate ligament, posterior cruciate ligament, deep medial collateral ligament,  patellofemoral ligament   IMPLANTS UTILIZED: DePuy Attune size 4 posterior stabilized femoral component (cemented), size 4 rotating platform tibial component (cemented), 32 mm medialized dome patella (cemented), and a 10 mm stabilized rotating platform polyethylene insert.  Treatments: None  Discharge Exam: Blood pressure 127/70, pulse 68, temperature 98 F (36.7 C), temperature source Oral, resp. rate 15, height 5' 6.5" (1.689 m), weight 90.3 kg, SpO2 98%.   Disposition: Home   Allergies as of 10/07/2023       Reactions   Other Itching, Rash   Shellfish containing products Shrimp   Hydrocodone Rash   Penicillins Rash   IgE = 23 (WNL) on 04/12/2019 Did it involve swelling of the face/tongue/throat, SOB, or low BP? No Did it involve sudden or severe rash/hives, skin peeling, or any reaction on the inside of your mouth or nose? Yes Did you need to seek medical attention at a hospital or doctor's office? Yes When did it last happen?      in her 30s If all above answers are "NO", may proceed with cephalosporin use.        Medication List     TAKE these medications    acetaminophen 500 MG tablet Commonly known as: TYLENOL Take 1,000 mg by mouth every 6 (six) hours as needed for mild pain (pain score 1-3), moderate pain (pain score 4-6) or headache.   aspirin 81 MG tablet Take 1 tablet (81 mg total) by mouth in the morning and at bedtime. What changed: when to take this   celecoxib 200 MG capsule Commonly known as: CELEBREX Take 1 capsule (200 mg total) by mouth 2 (two) times daily.   chlorhexidine 0.12 % solution Commonly  known as: PERIDEX Use as directed 5 mLs in the mouth or throat 2 (two) times daily.   cyanocobalamin 1000 MCG tablet Commonly known as: VITAMIN B12 Take 1,000 mcg by mouth daily.   finasteride 1 MG tablet Commonly known as: PROPECIA Take 1 mg by mouth daily.   hyoscyamine 0.375 MG 12 hr tablet Commonly known as: LEVBID Take 0.375 mg by mouth  every 12 (twelve) hours as needed for cramping.   lamoTRIgine 200 MG tablet Commonly known as: LAMICTAL Take 1 tablet (200 mg total) by mouth 2 (two) times daily.   losartan-hydrochlorothiazide 50-12.5 MG tablet Commonly known as: HYZAAR Take 1 tablet by mouth daily.   mirtazapine 15 MG tablet Commonly known as: REMERON Take 1 tablet by mouth at bedtime.   multivitamin tablet Take 1 tablet by mouth daily.   oxyCODONE 5 MG immediate release tablet Commonly known as: Oxy IR/ROXICODONE Take 1 tablet (5 mg total) by mouth every 4 (four) hours as needed for moderate pain (pain score 4-6) (pain score 4-6).   PARoxetine 20 MG tablet Commonly known as: PAXIL Take 20 mg by mouth daily.   SM Oyster Shell Calcium/Vit D 500-10 MG-MCG Tabs Generic drug: Calcium Carb-Cholecalciferol Take 1 tablet by mouth daily.   traMADol 50 MG tablet Commonly known as: ULTRAM Take 1-2 tablets (50-100 mg total) by mouth every 4 (four) hours as needed for moderate pain (pain score 4-6).   VITAMIN C PO Take 500 mg by mouth daily.   Vitamin D3 250 MCG (10000 UT) capsule Take 10,000 Units by mouth daily.               Durable Medical Equipment  (From admission, onward)           Start     Ordered   10/06/23 1534  DME Walker rolling  Once       Question:  Patient needs a walker to treat with the following condition  Answer:  Total knee replacement status   10/06/23 1533   10/06/23 1534  DME Bedside commode  Once       Comments: Patient is not able to walk the distance required to go the bathroom, or he/she is unable to safely negotiate stairs required to access the bathroom.  A 3in1 BSC will alleviate this problem  Question:  Patient needs a bedside commode to treat with the following condition  Answer:  Total knee replacement status   10/06/23 1533            Follow-up Information     Rayburn Go, PA-C Follow up on 10/21/2023.   Specialty: Orthopedic Surgery Why: at  1:15pm Contact information: [address] [place] [address] [phone]         Donato Heinz, MD Follow up on 11/18/2023.   Specialty: Orthopedic Surgery Why: at 2:15pm Contact information: [address] [facility] [place] [address] [phone]                 Signed: Gean Birchwood 10/07/2023, 8:30 AM   Objective: Vital signs in last 24 hours: Temp:  [97.4 F (36.3 C)-98.8 F (37.1 C)] 98 F (36.7 C) (03/13 0727) Pulse Rate:  [68-108] 68 (03/13 0727) Resp:  [13-20] 15 (03/13 0727) BP: (120-158)/(63-95) 127/70 (03/13 0727) SpO2:  [90 %-100 %] 98 % (03/13 0727) Weight:  [90.3 kg] 90.3 kg (03/12 0919)  Intake/Output from previous day:  Intake/Output Summary (Last 24 hours) at 10/07/2023 0830 Last data filed at  10/07/2023 0419 Gross per 24 hour  Intake 1931.95 ml  Output 570 ml  Net 1361.95 ml    Intake/Output this shift: No intake/output data recorded.  Labs: No results for input(s): "HGB" in the last 72 hours. No results for input(s): "WBC", "RBC", "HCT", "PLT" in the last 72 hours. No results for input(s): "NA", "K", "CL", "CO2", "BUN", "CREATININE", "GLUCOSE", "CALCIUM" in the last 72 hours. No results for input(s): "LABPT", "INR" in the last 72 hours.  EXAM: General - Patient is Alert, Appropriate, and Oriented Extremity - Neurologically intact Neurovascular intact Sensation intact distally Intact pulses distally Dorsiflexion/Plantar flexion intact No cellulitis present Compartment soft Dressing - dressing C/D/I and no drainage Motor Function - intact, moving foot and toes well on exam.  JP Drain pulled without difficulty. 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   Weight as of this encounter: 90.3 kg.  Patient will continue to work with physical therapy on gait, ROM and strengthening   Discussed with the patient continuing to utilize [employer]   Patient will use bone foam in 20-30 minute intervals   Patient will wear TED hose bilaterally to help prevent DVT and clot formation   Discussed the Aquacel bandage.  This bandage will stay in place 7 days postoperatively.  Can be replaced with honeycomb bandages that will be sent home with the patient   Discussed sending the patient home with tramadol and oxycodone for as needed pain management.  Patient will also be sent home with Celebrex to help with swelling and inflammation.  Patient will take an 81 mg aspirin twice daily for DVT prophylaxis   JP drain removed without difficulty, intact   Weight-Bearing as tolerated to left leg   Patient will follow-up with [facility] clinic orthopedics in 2 weeks for staple removal and reevaluation  Diet - Regular diet Follow up - in 2 weeks Activity - WBAT Disposition - Home Condition Upon Discharge - Good DVT Prophylaxis - Aspirin and TED hose  Danise Edge, PA-C Orthopaedic Surgery 10/07/2023, 8:30 AM

## 2023-10-07 NOTE — Plan of Care (Signed)
   Problem: Activity: Goal: Ability to avoid complications of mobility impairment will improve Outcome: Progressing   Problem: Pain Management: Goal: Pain level will decrease with appropriate interventions Outcome: Progressing

## 2023-10-07 NOTE — Progress Notes (Signed)
 DISCHARGE NOTE:   Pt discharge instructions given to pt as well as IV removed. Pt was given 2 honeycomb dressing as well as both TED hose on and in place. Pt 3 in 1 and rolling walker delivered to hospital room. Pt wheeled down to medical mall entrance by staff and pt's son provided transportation.

## 2023-10-07 NOTE — TOC Progression Note (Signed)
 Transition of Care [facility]) - Progression Note    Patient Details  Name: Lisa Yu MRN: [id number] Date of Birth: September 09, 1950  Transition of Care [facility]) CM/SW Contact  Marlowe Sax, RN Phone Number: 10/07/2023, 9:53 AM  Clinical Narrative:      Patient is set up prior to surgery by surgeons office, 3 in 1 and RW to be delivered by adapt to the bedside      Expected Discharge Plan and Services         Expected Discharge Date: 10/07/23                                     Social Determinants of Health (SDOH) Interventions SDOH Screenings   Food Insecurity: No Food Insecurity (10/06/2023)  Housing: Low Risk  (10/06/2023)  Transportation Needs: No Transportation Needs (10/06/2023)  Utilities: Not At Risk (10/06/2023)  Depression (PHQ2-9): Low Risk  (05/20/2019)  Financial Resource Strain: Low Risk  (09/21/2023)   Received from [facility]  Social Connections: Socially Integrated (10/06/2023)  Tobacco Use: Low Risk  (10/06/2023)  Recent Concern: Tobacco Use - Medium Risk (09/27/2023)   Received from [facility]    Readmission Risk Interventions     No data to display

## 2023-10-07 NOTE — Evaluation (Signed)
 Occupational Therapy Evaluation Patient Details Name: Lisa Yu MRN: [id number] DOB: Jun 14, 1951 Today's Date: 10/07/2023   History of Present Illness   Pt is POD #1 from L TKA on 3/12. PMH significant for seizures, COVID-19, ILD, MDD, cognitive impairment, R TKA, L rotator cuff repair     Clinical Impressions Pt seen for OT evaluation this date, POD#1 from above surgery. Pt was independent in all ADLs prior to surgery, 2 falls in the past six months. Plans to discharge to son's home (multi-level, 2 STE) for 24/4 supervision and assist. Pt is eager to return to PLOF with less pain and improved safety and independence. Pt currently requires minimal assist for LB dressing while in seated position due to pain and limited AROM of L knee. Able to perform straight leg raise bed level without difficulties. Pt instructed in polar care mgt, falls prevention strategies, home/routines modifications, DME/AE for LB bathing and dressing tasks, and compression stocking mgt. Pt would benefit from skilled OT services including additional instruction in dressing techniques with or without assistive devices for dressing and bathing skills to support recall and carryover prior to discharge and ultimately to maximize safety, independence, and minimize falls risk and caregiver burden. Do not currently anticipate any OT needs following this hospitalization.        If plan is discharge home, recommend the following:   A little help with walking and/or transfers;A little help with bathing/dressing/bathroom;Help with stairs or ramp for entrance;Assist for transportation     Functional Status Assessment   Patient has had a recent decline in their functional status and demonstrates the ability to make significant improvements in function in a reasonable and predictable amount of time.     Equipment Recommendations   BSC/3in1 (RW, reacher, LH sponge, sock aide)      Precautions/Restrictions    Precautions Precautions: Fall Required Braces or Orthoses:  (pt able to perform straight leg raise without difficulties, no brace needed per orders) Restrictions Weight Bearing Restrictions Per Provider Order: Yes LLE Weight Bearing Per Provider Order: Weight bearing as tolerated     Mobility Bed Mobility Overal bed mobility: Modified Independent             General bed mobility comments: use of bed rails and HOB elevated    Transfers Overall transfer level: Needs assistance Equipment used: Rolling walker (2 wheels) Transfers: Sit to/from Stand, Bed to chair/wheelchair/BSC Sit to Stand: Supervision, Contact guard assist     Step pivot transfers: Supervision     General transfer comment: CGA for first sit<>stand transfer from regular height bed, progressing quickly to supervision with min vcs for toilet transfers with BSC over reg height commode      Balance Overall balance assessment: History of Falls, Needs assistance Sitting-balance support: Feet supported, No upper extremity supported Sitting balance-Leahy Scale: Good Sitting balance - Comments: no LOB while performing dressing tasks   Standing balance support: No upper extremity supported, During functional activity Standing balance-Leahy Scale: Good Standing balance comment: able to stand to pull pants over hips without UE support, supervision for safety                           ADL either performed or assessed with clinical judgement   ADL Overall ADL's : Needs assistance/impaired     Grooming: Supervision/safety;Standing;Wash/dry hands           Upper Body Dressing : Supervision/safety;Sitting Upper Body Dressing Details (indicate cue type and reason):  no LOB while performing dressing seated EOB to doff gown/don shirt Lower Body Dressing: Sit to/from stand;Contact guard assist Lower Body Dressing Details (indicate cue type and reason): maxA for TED hose donning with education using plastic  bag technique, able to don pants with minA to thread over feet Toilet Transfer: Supervision/safety;BSC/3in1;Ambulation;Rolling walker (2 wheels);Cueing for sequencing Toilet Transfer Details (indicate cue type and reason): min vcs for sequencing for appropriate transfer and RW use. BSC placed over commode for bars/height Toileting- Clothing Manipulation and Hygiene: Supervision/safety;Sit to/from stand       Functional mobility during ADLs: Supervision/safety;Cueing for sequencing;Rolling walker (2 wheels)        Pertinent Vitals/Pain Pain Assessment Pain Assessment: 0-10 Pain Score: 4  Pain Location: L knee Pain Descriptors / Indicators: Discomfort, Grimacing, Dull, Aching Pain Intervention(s): Limited activity within patient's tolerance, Monitored during session     Extremity/Trunk Assessment Upper Extremity Assessment Upper Extremity Assessment: Overall WFL for tasks assessed   Lower Extremity Assessment Lower Extremity Assessment: Defer to PT evaluation;LLE deficits/detail LLE Deficits / Details: Able to perform straight leg raise without difficulties   Cervical / Trunk Assessment Cervical / Trunk Assessment: Normal   Communication Communication Communication: No apparent difficulties   Cognition Arousal: Alert Behavior During Therapy: WFL for tasks assessed/performed Cognition: History of cognitive impairments             OT - Cognition Comments: A&Ox4, follows commands consistently                 Following commands: Intact       Cueing  General Comments   Cueing Techniques: Verbal cues  Dressing site dry and intact start/end of session, TED hose on BLE           Home Living Family/patient expects to be discharged to:: Private residence Living Arrangements: Spouse/significant other;Children (will be discharging to son/daughter in law's home) Available Help at Discharge: Family;Available PRN/intermittently Type of Home: House Home Access:  Stairs to enter [employer] of Steps: 2 Entrance Stairs-Rails: None Home Layout: Two level;Able to live on main level with bedroom/bathroom     Bathroom Shower/Tub: Producer, television/film/video: Standard Bathroom Accessibility: Yes How Accessible: Accessible via walker Home Equipment: Grab bars - toilet;Grab bars - tub/shower;Hand held shower head          Prior Functioning/Environment Prior Level of Function : Independent/Modified Independent;History of Falls (last six months)             Mobility Comments: 2 falls in the past six months, no AD at baseline ADLs Comments: independent    OT Problem List: Decreased strength;Decreased range of motion;Impaired balance (sitting and/or standing);Decreased knowledge of use of DME or AE;Decreased knowledge of precautions   OT Treatment/Interventions: Self-care/ADL training;Balance training;Patient/family education;Therapeutic activities;DME and/or AE instruction;Therapeutic exercise      OT Goals(Current goals can be found in the care plan section)   ADL Goals Pt Will Perform Upper Body Dressing: with modified independence;sitting Pt Will Perform Lower Body Dressing: with modified independence;with adaptive equipment;sitting/lateral leans;sit to/from stand Pt Will Transfer to Toilet: with modified independence;ambulating;bedside commode Pt Will Perform Toileting - Clothing Manipulation and hygiene: with modified independence;with adaptive equipment;sitting/lateral leans;sit to/from stand   OT Frequency:  Min 1X/week       AM-PAC OT "6 Clicks" Daily Activity     Outcome Measure Help from another person eating meals?: None Help from another person taking care of personal grooming?: None Help from another person toileting, which includes using  toliet, bedpan, or urinal?: A Little Help from another person bathing (including washing, rinsing, drying)?: A Little Help from another person to put on and taking off  regular upper body clothing?: None Help from another person to put on and taking off regular lower body clothing?: A Little 6 Click Score: 21   End of Session Equipment Utilized During Treatment: Rolling walker (2 wheels) Nurse Communication: Mobility status  Activity Tolerance: Patient tolerated treatment well Patient left: in bed;with call bell/phone within reach  OT Visit Diagnosis: History of falling (Z91.81);Other abnormalities of gait and mobility (R26.89);Unsteadiness on feet (R26.81)                Time: 0981-1914 OT Time Calculation (min): 31 min Charges:  OT General Charges $OT Visit: 1 Visit OT Evaluation $OT Eval Low Complexity: 1 Low OT Treatments $Self Care/Home Management : 8-22 mins  Tinya Cadogan L. Shela Esses, OTR/L  10/07/23, 10:04 AM

## 2023-10-08 ENCOUNTER — Encounter: Payer: Self-pay | Admitting: Orthopedic Surgery

## 2023-10-20 IMAGING — MG DIGITAL DIAGNOSTIC BILAT W/ TOMO W/ CAD
6 of 10 series · 6 of 30 positions shown · non-contrast
Comparison: None Available.

CLINICAL DATA: Screening recall for a right breast distortion and
possible left breast mass. The patient has history of breast
explant.

EXAM:
DIGITAL DIAGNOSTIC BILATERAL MAMMOGRAM WITH TOMOSYNTHESIS AND CAD;
ULTRASOUND LEFT BREAST LIMITED
TECHNIQUE: Bilateral digital diagnostic mammography and breast tomosynthesis
was performed. The images were evaluated with computer-aided
detection.; Targeted ultrasound examination of the left breast was
performed.

[R CC synth-2D]
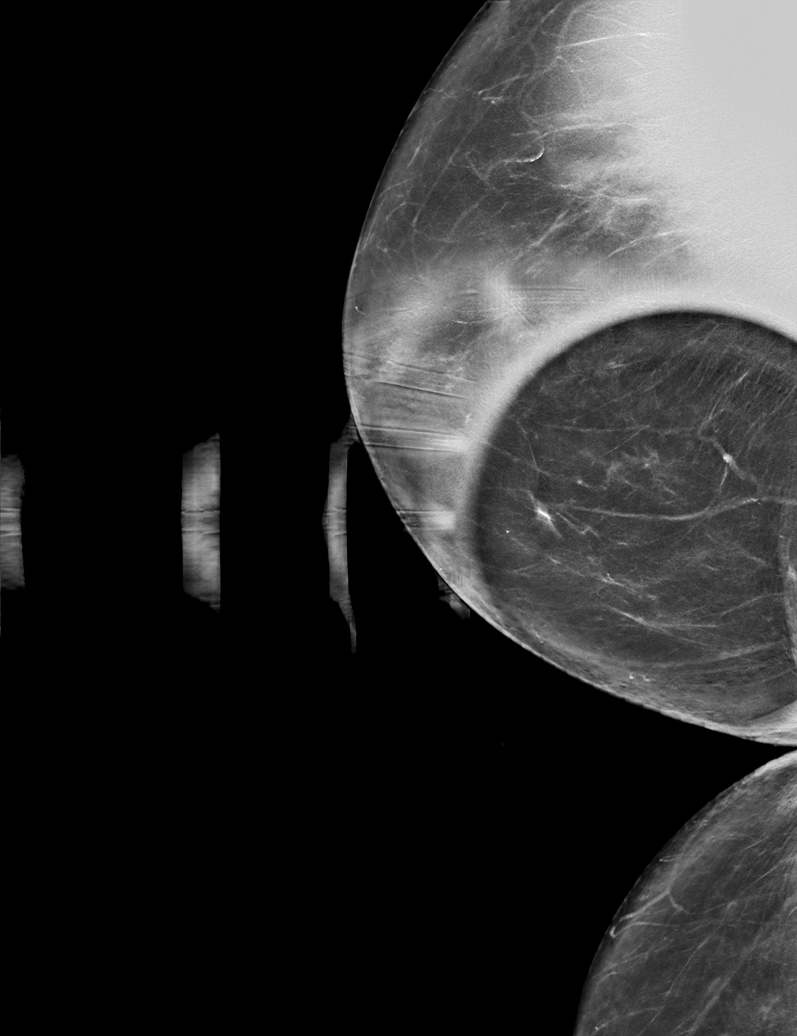

[L CC synth-2D]
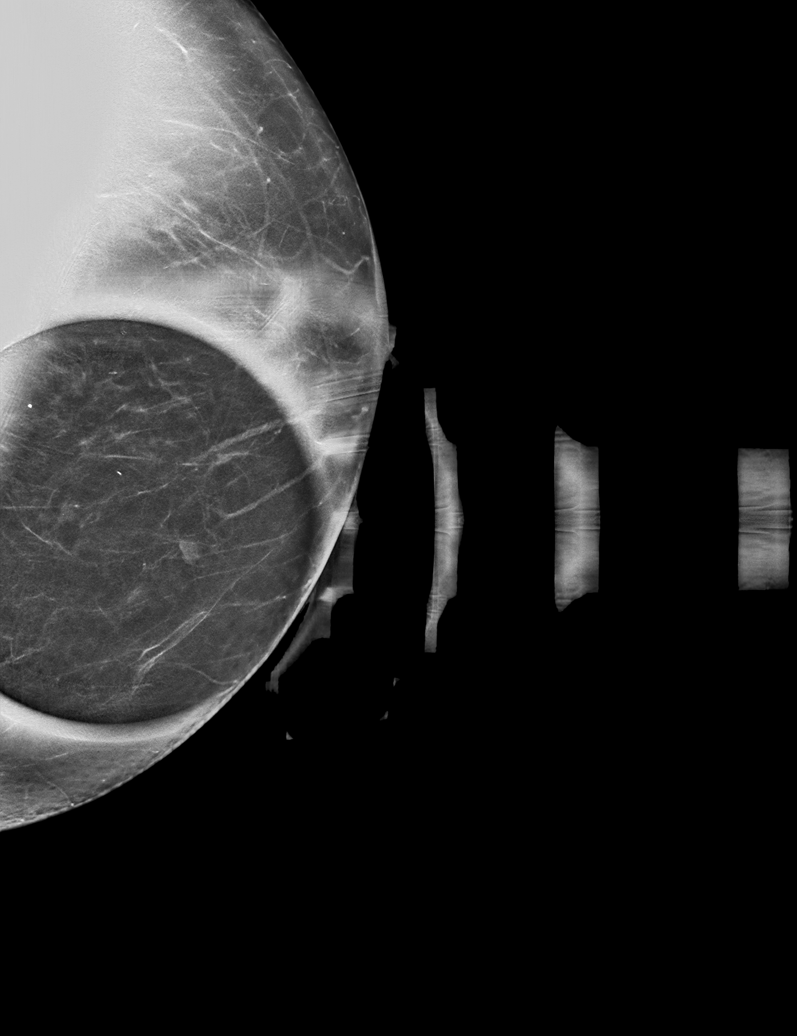

[R MLO synth-2D]
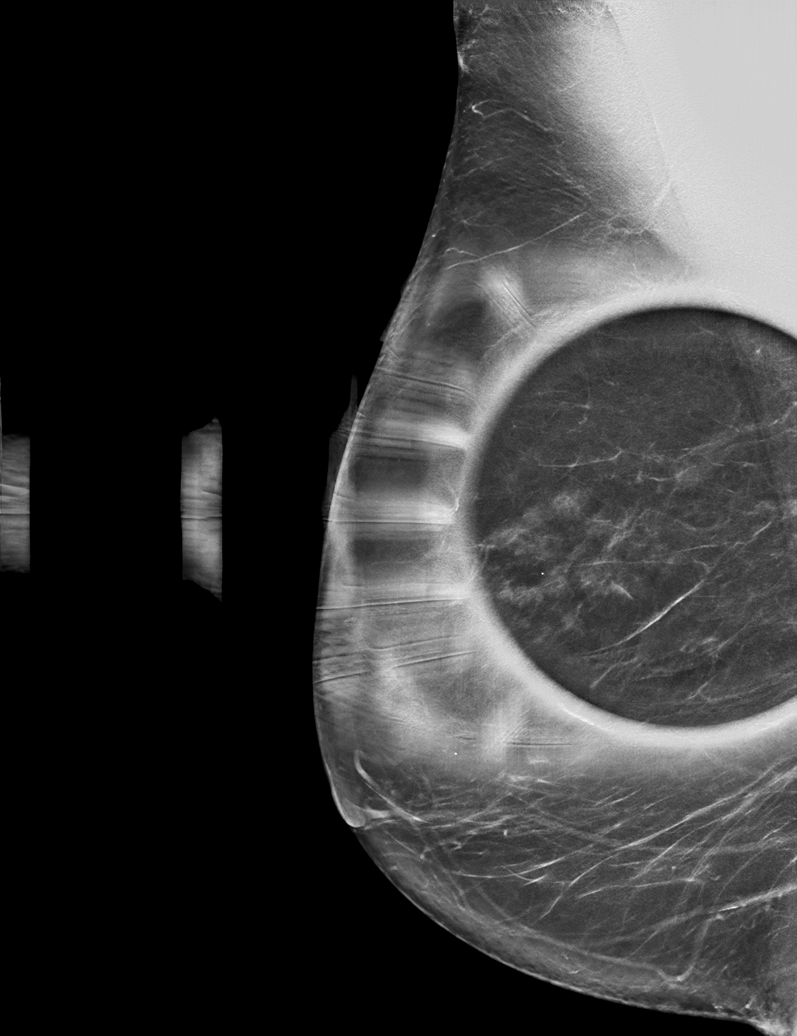

[R ML synth-2D]
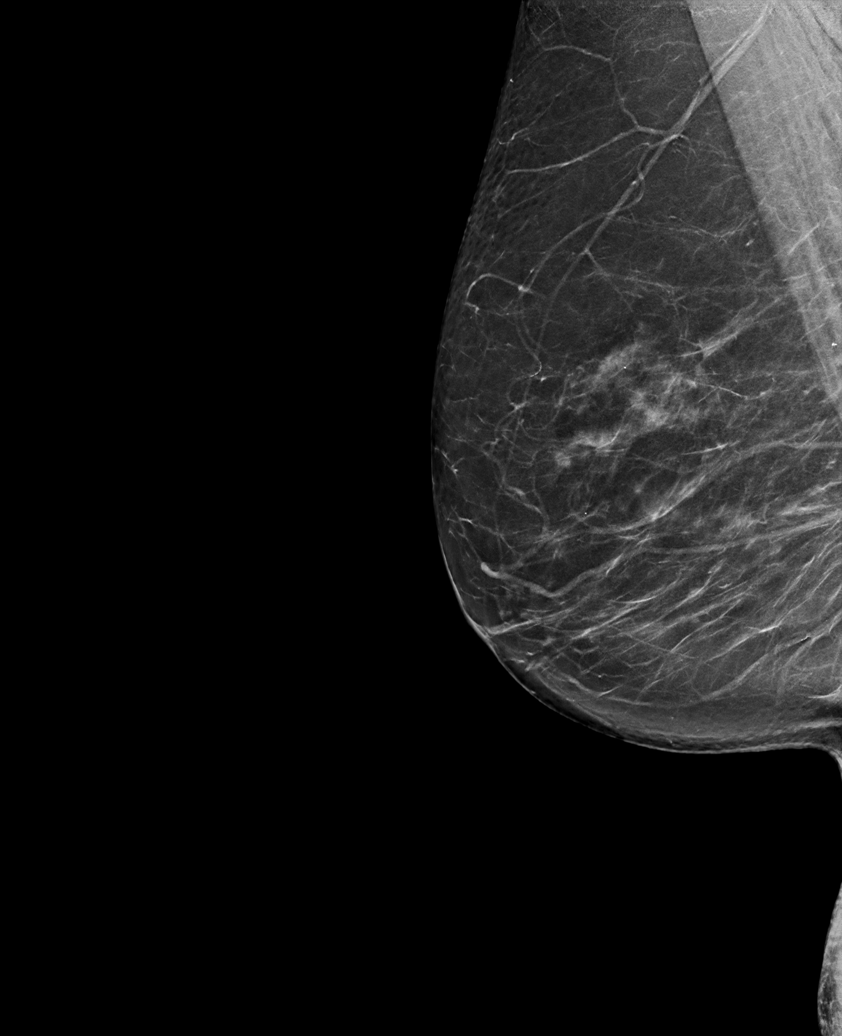

[L MLO synth-2D]
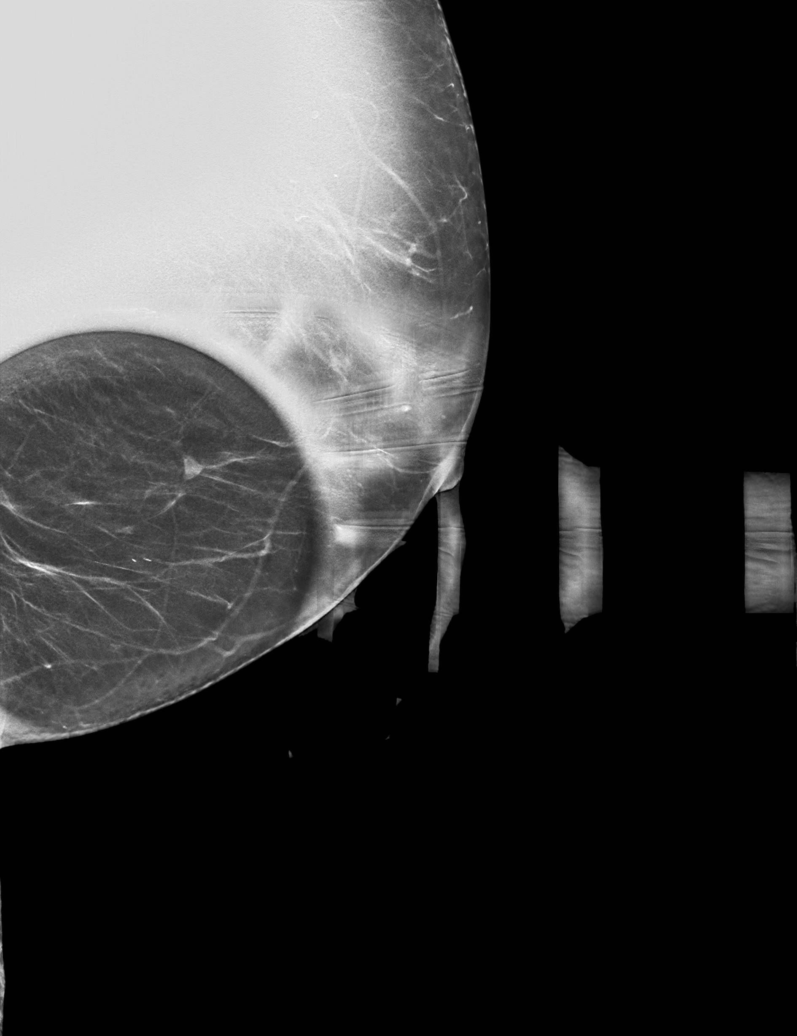

[R MLO tomo · tomo slice 39/76.0]
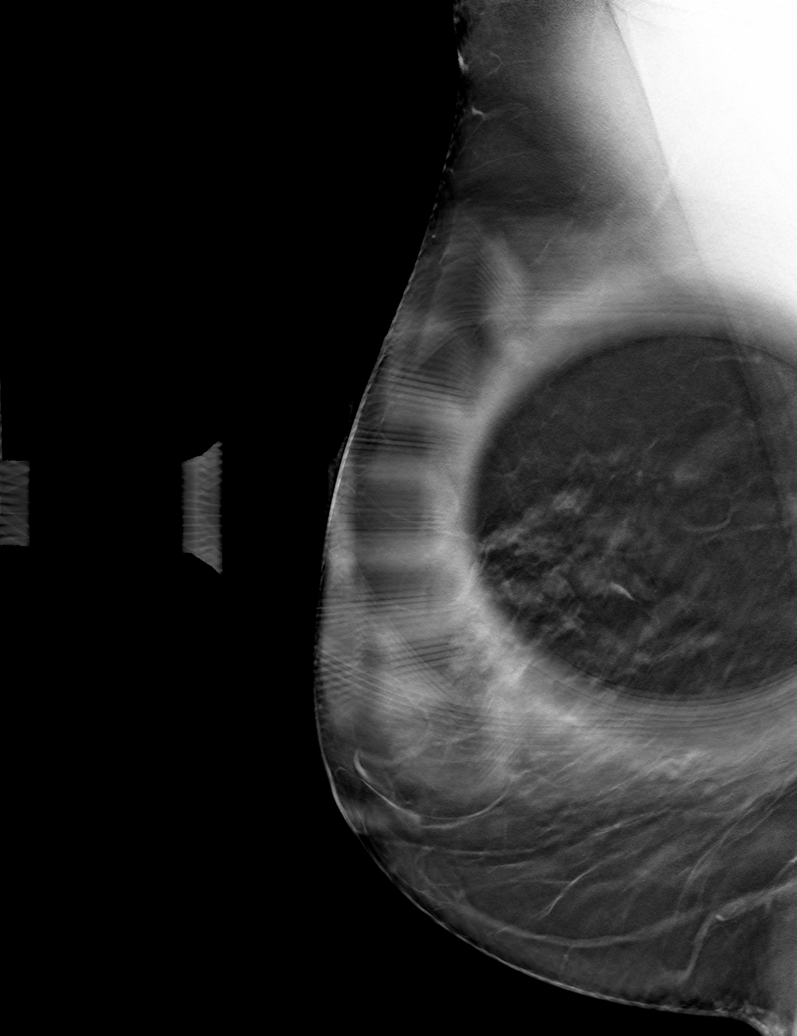

[6 of 30 positions shown; findings below may reference images not displayed]

ACR Breast Density Category b: There are scattered areas of
fibroglandular density.
FINDINGS: The questioned distortion in the upper inner right breast resolves
with spot compression tomosynthesis imaging.

There is a persistent 4 mm oval mass in the medial to lower inner
quadrant of the left breast, middle depth.

Ultrasound targeted to the left breast at 8 o'clock, 5 cm from the
nipple demonstrates an oval near anechoic mass measuring 5 x 3 x 4
mm. Ultrasound of the left axilla demonstrates multiple
normal-appearing lymph nodes.
IMPRESSION: 1. There is a 5 mm mass in the left breast at 8 o'clock, favored to
represent a complicated cyst.

2. The distortion in the left breast resolves with spot compression
tomosynthesis imaging consistent with overlapping fibroglandular
tissue.

3.  No evidence of left axillary lymphadenopathy.

RECOMMENDATION:
1. Ultrasound-guided aspiration versus biopsy is recommended for the
left breast mass. The patient will be contacted by the breast
navigator to schedule the biopsy at her earliest convenience.

I have discussed the findings and recommendations with the patient.
If applicable, a reminder letter will be sent to the patient
regarding the next appointment.

BI-RADS CATEGORY  4: Suspicious.

## 2023-10-30 ENCOUNTER — Emergency Department: Admitting: Certified Registered Nurse Anesthetist

## 2023-10-30 ENCOUNTER — Other Ambulatory Visit: Payer: Self-pay

## 2023-10-30 ENCOUNTER — Encounter
Admission: EM | Disposition: A | Discharge status: Home / Self Care | Payer: Self-pay | Source: Home / Self Care | Attending: Emergency Medicine

## 2023-10-30 ENCOUNTER — Observation Stay
Admission: EM | Admit: 2023-10-30 | Discharge: 2023-10-31 | Disposition: A | Discharge status: Home / Self Care | Attending: Orthopedic Surgery | Admitting: Orthopedic Surgery

## 2023-10-30 ENCOUNTER — Emergency Department

## 2023-10-30 DIAGNOSIS — I10 Essential (primary) hypertension: Secondary | ICD-10-CM | POA: Insufficient documentation

## 2023-10-30 DIAGNOSIS — Z79899 Other long term (current) drug therapy: Secondary | ICD-10-CM | POA: Diagnosis not present

## 2023-10-30 DIAGNOSIS — T8131XA Disruption of external operation (surgical) wound, not elsewhere classified, initial encounter: Secondary | ICD-10-CM | POA: Diagnosis present

## 2023-10-30 DIAGNOSIS — Z96653 Presence of artificial knee joint, bilateral: Secondary | ICD-10-CM | POA: Diagnosis not present

## 2023-10-30 DIAGNOSIS — Z7982 Long term (current) use of aspirin: Secondary | ICD-10-CM | POA: Insufficient documentation

## 2023-10-30 DIAGNOSIS — T8131XD Disruption of external operation (surgical) wound, not elsewhere classified, subsequent encounter: Secondary | ICD-10-CM

## 2023-10-30 DIAGNOSIS — Y848 Other medical procedures as the cause of abnormal reaction of the patient, or of later complication, without mention of misadventure at the time of the procedure: Secondary | ICD-10-CM | POA: Insufficient documentation

## 2023-10-30 HISTORY — PX: IRRIGATION AND DEBRIDEMENT KNEE: SHX5185

## 2023-10-30 LAB — BASIC METABOLIC PANEL WITH GFR
Anion gap: 9 (ref 5–15)
BUN: 27 mg/dL — ABNORMAL HIGH (ref 8–23)
CO2: 25 mmol/L (ref 22–32)
Calcium: 9.1 mg/dL (ref 8.9–10.3)
Chloride: 106 mmol/L (ref 98–111)
Creatinine, Ser: 0.81 mg/dL (ref 0.44–1.00)
GFR, Estimated: >60 mL/min (ref >60)
Glucose, Bld: 109 mg/dL — ABNORMAL HIGH (ref 70–99)
Potassium: 4.2 mmol/L (ref 3.5–5.1)
Sodium: 140 mmol/L (ref 135–145)

## 2023-10-30 LAB — CBC
HCT: 33.4 % — ABNORMAL LOW (ref 36.0–46.0)
Hemoglobin: 10.5 g/dL — ABNORMAL LOW (ref 12.0–15.0)
MCH: 30.1 pg (ref 26.0–34.0)
MCHC: 31.4 g/dL (ref 30.0–36.0)
MCV: 95.7 fL (ref 80.0–100.0)
Platelets: 406 10*3/uL — ABNORMAL HIGH (ref 150–400)
RBC: 3.49 MIL/uL — ABNORMAL LOW (ref 3.87–5.11)
RDW: 14.9 % (ref 11.5–15.5)
WBC: 9.8 10*3/uL (ref 4.0–10.5)
nRBC: 0 % (ref 0.0–0.2)

## 2023-10-30 SURGERY — IRRIGATION AND DEBRIDEMENT KNEE
Anesthesia: General | Site: Knee | Laterality: Left

## 2023-10-30 MED ORDER — SUCCINYLCHOLINE CHLORIDE 200 MG/10ML IV SOSY
PREFILLED_SYRINGE | INTRAVENOUS | Status: AC
  Filled 2023-10-30: qty 10

## 2023-10-30 MED ORDER — SUGAMMADEX SODIUM 200 MG/2ML IV SOLN
INTRAVENOUS | Status: AC
  Filled 2023-10-30: qty 2

## 2023-10-30 MED ORDER — FENTANYL CITRATE (PF) 100 MCG/2ML IJ SOLN
INTRAMUSCULAR | Status: AC
  Filled 2023-10-30: qty 2

## 2023-10-30 MED ORDER — PROPOFOL 10 MG/ML IV BOLUS
INTRAVENOUS | Status: DC | PRN
Start: 2023-10-30 — End: 2023-10-31
  Administered 2023-10-30: 140 mg via INTRAVENOUS

## 2023-10-30 MED ORDER — ACETAMINOPHEN 10 MG/ML IV SOLN
INTRAVENOUS | Status: DC | PRN
  Administered 2023-10-30: 1000 mg via INTRAVENOUS

## 2023-10-30 MED ORDER — SODIUM CHLORIDE 0.9 % IR SOLN
Status: DC | PRN
  Administered 2023-10-30: 300 mL

## 2023-10-30 MED ORDER — ROCURONIUM BROMIDE 100 MG/10ML IV SOLN
INTRAVENOUS | Status: DC | PRN
  Administered 2023-10-30: 40 mg via INTRAVENOUS

## 2023-10-30 MED ORDER — DEXAMETHASONE SODIUM PHOSPHATE 10 MG/ML IJ SOLN
INTRAMUSCULAR | Status: DC | PRN
  Administered 2023-10-30: 10 mg via INTRAVENOUS

## 2023-10-30 MED ORDER — ONDANSETRON HCL 4 MG/2ML IJ SOLN
4.0000 mg | Freq: Once | INTRAMUSCULAR | Status: DC
  Filled 2023-10-30: qty 2

## 2023-10-30 MED ORDER — ACETAMINOPHEN 10 MG/ML IV SOLN
INTRAVENOUS | Status: AC
  Filled 2023-10-30: qty 100

## 2023-10-30 MED ORDER — MORPHINE SULFATE (PF) 4 MG/ML IV SOLN
4.0000 mg | Freq: Once | INTRAVENOUS | Status: AC
  Administered 2023-10-30: 4 mg via INTRAVENOUS
  Filled 2023-10-30: qty 1

## 2023-10-30 MED ORDER — ONDANSETRON HCL 4 MG/2ML IJ SOLN
INTRAMUSCULAR | Status: AC
  Filled 2023-10-30: qty 2

## 2023-10-30 MED ORDER — FENTANYL CITRATE (PF) 100 MCG/2ML IJ SOLN
INTRAMUSCULAR | Status: DC | PRN
  Administered 2023-10-30: 50 ug via INTRAVENOUS
  Administered 2023-10-30: 100 ug via INTRAVENOUS

## 2023-10-30 MED ORDER — SUCCINYLCHOLINE CHLORIDE 200 MG/10ML IV SOSY
PREFILLED_SYRINGE | INTRAVENOUS | Status: DC | PRN
Start: 2023-10-30 — End: 2023-10-31
  Administered 2023-10-30: 100 mg via INTRAVENOUS

## 2023-10-30 MED ORDER — ROCURONIUM BROMIDE 10 MG/ML (PF) SYRINGE
PREFILLED_SYRINGE | INTRAVENOUS | Status: AC
Start: 2023-10-30 — End: ?
  Filled 2023-10-30: qty 10

## 2023-10-30 MED ORDER — LACTATED RINGERS IV SOLN: INTRAVENOUS | Status: DC | PRN

## 2023-10-30 MED ORDER — CEFAZOLIN SODIUM-DEXTROSE 2-4 GM/100ML-% IV SOLN
2.0000 g | Freq: Once | INTRAVENOUS | Status: AC
  Administered 2023-10-30: 2 g via INTRAVENOUS
  Filled 2023-10-30: qty 100

## 2023-10-30 MED ORDER — LIDOCAINE HCL (CARDIAC) PF 100 MG/5ML IV SOSY
PREFILLED_SYRINGE | INTRAVENOUS | Status: DC | PRN
  Administered 2023-10-30: 100 mg via INTRAVENOUS

## 2023-10-30 MED ORDER — PHENYLEPHRINE 80 MCG/ML (10ML) SYRINGE FOR IV PUSH (FOR BLOOD PRESSURE SUPPORT)
PREFILLED_SYRINGE | INTRAVENOUS | Status: AC
  Filled 2023-10-30: qty 10

## 2023-10-30 MED ORDER — ONDANSETRON HCL 4 MG/2ML IJ SOLN
INTRAMUSCULAR | Status: DC | PRN
  Administered 2023-10-30: 4 mg via INTRAVENOUS

## 2023-10-30 MED ORDER — MORPHINE SULFATE (PF) 4 MG/ML IV SOLN
6.0000 mg | Freq: Once | INTRAVENOUS | Status: AC
  Administered 2023-10-30: 6 mg via INTRAVENOUS
  Filled 2023-10-30: qty 2

## 2023-10-30 MED ORDER — SODIUM CHLORIDE 0.9 % IR SOLN
Status: DC | PRN
  Administered 2023-10-30: 9000 mL

## 2023-10-30 MED ORDER — LIDOCAINE HCL (PF) 2 % IJ SOLN
INTRAMUSCULAR | Status: AC
  Filled 2023-10-30: qty 5

## 2023-10-30 MED ORDER — PHENYLEPHRINE 80 MCG/ML (10ML) SYRINGE FOR IV PUSH (FOR BLOOD PRESSURE SUPPORT)
PREFILLED_SYRINGE | INTRAVENOUS | Status: DC | PRN
Start: 2023-10-30 — End: 2023-10-31
  Administered 2023-10-30 (×2): 80 ug via INTRAVENOUS

## 2023-10-30 MED ORDER — PROPOFOL 10 MG/ML IV BOLUS
INTRAVENOUS | Status: AC
  Filled 2023-10-30: qty 20

## 2023-10-30 MED ORDER — SEVOFLURANE IN SOLN
RESPIRATORY_TRACT | Status: AC
  Filled 2023-10-30: qty 250

## 2023-10-30 SURGICAL SUPPLY — 45 items
BNDG ESMARCH 6X12 STRL LF (GAUZE/BANDAGES/DRESSINGS) IMPLANT
COOLER POLAR GLACIER W/PUMP (MISCELLANEOUS) ×1 IMPLANT
CUFF TRNQT CYL 30X4X21-28X (TOURNIQUET CUFF) IMPLANT
DRESSING PEEL AND PLAC PRVNA20 (GAUZE/BANDAGES/DRESSINGS) IMPLANT
DRSG NON-ADHERENT DERMACEA 3X4 (GAUZE/BANDAGES/DRESSINGS) ×1 IMPLANT
DRSG OPSITE POSTOP 4X14 (GAUZE/BANDAGES/DRESSINGS) ×1 IMPLANT
DRSG PEEL AND PLACE PREVENA 20 (GAUZE/BANDAGES/DRESSINGS) ×1 IMPLANT
ELECT REM PT RETURN 9FT ADLT (ELECTROSURGICAL) ×1 IMPLANT
ELECTRODE REM PT RTRN 9FT ADLT (ELECTROSURGICAL) ×1 IMPLANT
GLOVE BIOGEL M STRL SZ7.5 (GLOVE) ×2 IMPLANT
GOWN STRL REUS W/ TWL LRG LVL3 (GOWN DISPOSABLE) ×2 IMPLANT
HANDPIECE VERSAJET DEBRIDEMENT (MISCELLANEOUS) IMPLANT
HOLDER FOLEY CATH W/STRAP (MISCELLANEOUS) ×1 IMPLANT
KIT TURNOVER KIT A (KITS) ×1 IMPLANT
MANIFOLD NEPTUNE II (INSTRUMENTS) ×2 IMPLANT
NDL SAFETY ECLIPSE 18X1.5 (NEEDLE) ×1 IMPLANT
NDL SPNL 20GX3.5 QUINCKE YW (NEEDLE) ×1 IMPLANT
NEEDLE SPNL 20GX3.5 QUINCKE YW (NEEDLE) ×1 IMPLANT
NS IRRIG 500ML POUR BTL (IV SOLUTION) ×1 IMPLANT
PACK TOTAL KNEE (MISCELLANEOUS) ×1 IMPLANT
PAD WRAPON POLAR KNEE (MISCELLANEOUS) ×1 IMPLANT
PENCIL SMOKE EVACUATOR (MISCELLANEOUS) ×1 IMPLANT
PULSAVAC PLUS IRRIG FAN TIP (DISPOSABLE) ×1 IMPLANT
SOL .9 NS 3000ML IRR UROMATIC (IV SOLUTION) ×1 IMPLANT
SOL PREP PVP 2OZ (MISCELLANEOUS) ×4 IMPLANT
SOLUTION PREP PVP 2OZ (MISCELLANEOUS) ×1 IMPLANT
SPONGE DRAIN TRACH 4X4 STRL 2S (GAUZE/BANDAGES/DRESSINGS) ×1 IMPLANT
STAPLER SKIN PROX 35W (STAPLE) ×1 IMPLANT
STOCKINETTE BIAS CUT 4 980044 (GAUZE/BANDAGES/DRESSINGS) IMPLANT
SUCTION TUBE FRAZIER 10FR DISP (SUCTIONS) ×1 IMPLANT
SUT MNCRL AB 4-0 PS2 18 (SUTURE) ×1 IMPLANT
SUT MON AB 2-0 CT1 36 (SUTURE) ×1 IMPLANT
SUT PROLENE 1 CT 1 30 (SUTURE) ×1 IMPLANT
SUT VIC AB 0 CT1 36 (SUTURE) ×1 IMPLANT
SUT VIC AB 1 CT1 36 (SUTURE) ×2 IMPLANT
SUT VIC AB 2-0 CT2 27 (SUTURE) ×1 IMPLANT
SYR 20ML LL LF (SYRINGE) ×1 IMPLANT
SYR 30ML LL (SYRINGE) ×1 IMPLANT
SYR 50ML LL SCALE MARK (SYRINGE) ×1 IMPLANT
TIP FAN IRRIG PULSAVAC PLUS (DISPOSABLE) ×1 IMPLANT
TOWEL OR 17X26 4PK STRL BLUE (TOWEL DISPOSABLE) ×1 IMPLANT
TRAP FLUID SMOKE EVACUATOR (MISCELLANEOUS) ×1 IMPLANT
TRAY FOLEY MTR SLVR 16FR STAT (SET/KITS/TRAYS/PACK) ×1 IMPLANT
WATER STERILE IRR 500ML POUR (IV SOLUTION) ×1 IMPLANT
WRAPON POLAR PAD KNEE (MISCELLANEOUS) IMPLANT

## 2023-10-30 NOTE — Anesthesia Preprocedure Evaluation (Signed)
 Anesthesia Evaluation  Patient identified by MRN, date of birth, ID band Patient awake  General Assessment Comment:  Patient with open wound from recent knee arhtroplasty due to fall/trauma.  Reviewed: Allergy & Precautions, NPO status , Patient's Chart, lab work & pertinent test results  History of Anesthesia Complications Negative for: history of anesthetic complications  Airway Mallampati: II  TM Distance: >3 FB Neck ROM: Full    Dental  (+) Implants, Chipped   Pulmonary neg pulmonary ROS, neg sleep apnea, neg COPD, Patient abstained from smoking.Not current smoker   Pulmonary exam normal breath sounds clear to auscultation       Cardiovascular Exercise Tolerance: Good METShypertension, On Medications (-) CAD and (-) Past MI Normal cardiovascular exam(-) dysrhythmias  Rhythm:Regular Rate:Normal - Systolic murmurs    Neuro/Psych Seizures - (x1, 2009), Well Controlled,  PSYCHIATRIC DISORDERS Anxiety Depression       GI/Hepatic negative GI ROS, Neg liver ROS,neg GERD  ,,  Endo/Other  negative endocrine ROSneg diabetes    Renal/GU negative Renal ROS     Musculoskeletal   Abdominal   Peds  Hematology negative hematology ROS (+) Skin BCC   Anesthesia Other Findings Past Medical History: No date: Anxiety No date: Arthritis No date: Back pain No date: Complex partial seizures (HCC)     Comment:  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 (last one 2011) No date: Diverticulosis No date: Essential hypertension No date: IBS (irritable bowel syndrome) No date: Iron deficiency anemia No date: Mixed hyperlipidemia 12/21/2007: Osteopenia No date: Primary osteoarthritis of left knee No date: Seizures (HCC)     Comment:  1 in 2010 No date: Vitamin B12 deficiency No date: Vitamin D deficiency  Past Surgical History: 1998: AUGMENTATION MAMMAPLASTY; Bilateral     Comment:  removed 2013 1983: BREAST SURGERY     Comment:   bilateral implants 11/18/2020: CATARACT EXTRACTION W/PHACO; Right     Comment:  Procedure: CATARACT EXTRACTION PHACO AND INTRAOCULAR               LENS PLACEMENT (IOC) RIGHT PANOPTIX LENS 3.49 00:29.8;                Surgeon: Nevada Crane, MD;  Location: [facility];  Service: Ophthalmology;  Laterality:               Right; 12/02/2020: CATARACT EXTRACTION W/PHACO; Left     Comment:  Procedure: CATARACT EXTRACTION PHACO AND INTRAOCULAR               LENS PLACEMENT (IOC) LEFT;  Surgeon: Nevada Crane,               MD;  Location: [facility];  Service:               Ophthalmology;  Laterality: Left;  2.69 00:23.4 05/13/2016: CHONDROPLASTY; Right     Comment:  Procedure: CHONDROPLASTY;  Surgeon: Donato Heinz, MD;               Location: [facility] OR[facility];  Service: Orthopedics;  Laterality:               Right; 05/01/2005: COLONOSCOPY 03/26/2020: COLONOSCOPY WITH ESOPHAGOGASTRODUODENOSCOPY (EGD) No date: FOOT SURGERY; Bilateral     Comment:  straightening toes (4th & 5th) with pins and then had               the pins removed 1999:  GYNECOLOGIC CRYOSURGERY 04/26/2019: KNEE ARTHROPLASTY; Right     Comment:  Procedure: COMPUTER ASSISTED TOTAL KNEE ARTHROPLASTY;                Surgeon: Donato Heinz, MD;  Location: [facility] OR[facility];                Service: Orthopedics;  Laterality: Right; 05/13/2016: KNEE ARTHROSCOPY; Right     Comment:  Procedure: ARTHROSCOPY KNEE WITH MEDIAL AND LATERAL               MENISECTOMY;  Surgeon: Donato Heinz, MD;  Location:               [facility] OR[facility];  Service: Orthopedics;  Laterality: Right; 2007: LASIK No date: MOUTH SURGERY     Comment:  DENTAL IMPLANTS (TOP) 2011: ROTATOR CUFF REPAIR; Left No date: VAGINAL DELIVERY     Comment:  x 1  BMI    Body Mass Index: 31.64 kg/m      Reproductive/Obstetrics negative OB ROS                             Anesthesia Physical Anesthesia Plan  ASA: 2 and  emergent  Anesthesia Plan: General   Post-op Pain Management: Ofirmev IV (intra-op)*   Induction: Intravenous and Rapid sequence  PONV Risk Score and Plan: 2 and Ondansetron, Dexamethasone and Treatment may vary due to age or medical condition  Airway Management Planned: Oral ETT and Video Laryngoscope Planned  Additional Equipment: None  Intra-op Plan:   Post-operative Plan: Extubation in OR  Informed Consent: I have reviewed the patients History and Physical, chart, labs and discussed the procedure including the risks, benefits and alternatives for the proposed anesthesia with the patient or authorized representative who has indicated his/her understanding and acceptance.     Dental advisory given  Plan Discussed with: CRNA and Surgeon  Anesthesia Plan Comments: (Patient last ate at 4pm (6hours ago; hamburger). Discussed with surgeon who deems the surgical risks of waiting full 8 hours to outweigh the benefits and will document it as an emergency. Discussed risks of anesthesia with patient, including PONV, sore throat, aspiration, lip/dental/eye damage. Rare risks discussed as well, such as cardiorespiratory and neurological sequelae, and allergic reactions. Discussed the role of CRNA in patient's perioperative care. Patient understands.)       Anesthesia Quick Evaluation

## 2023-10-30 NOTE — ED Provider Notes (Addendum)
 [facility] Provider Note    Event Date/Time   First MD Initiated Contact with Patient 10/30/23 1834     (approximate)   History   Leg Injury   HPI  Lisa Yu is a 73 y.o. female presents to the emergency department with left knee injury.  Patient had a left knee replacement by Dr. Ernest Pine on 10/06/2023.  Patient had her staples out recently.  Tripped and fell landing on her left knee.  The wounds were her stitches were dehisced and had significant bleeding.  Given 100 mcg of fentanyl with EMS prior to arrival.  Not on anticoagulation.     Physical Exam   Triage Vital Signs: ED Triage Vitals  Encounter Vitals Group     BP      Systolic BP Percentile      Diastolic BP Percentile      Pulse      Resp      Temp      Temp src      SpO2      Weight      Height      Head Circumference      Peak Flow      Pain Score      Pain Loc      Pain Education      Exclude from Growth Chart     Most recent vital signs: Vitals:   10/30/23 1836  BP: 137/70  Pulse: 95  Resp: 18  Temp: 98.4 F (36.9 C)  SpO2: 94%    Physical Exam Constitutional:      Appearance: She is well-developed.  HENT:     Head: Atraumatic.  Eyes:     Conjunctiva/sclera: Conjunctivae normal.  Cardiovascular:     Rate and Rhythm: Regular rhythm.  Pulmonary:     Effort: No respiratory distress.  Abdominal:     General: There is no distension.  Musculoskeletal:        General: Normal range of motion.     Cervical back: Normal range of motion.  Skin:    General: Skin is warm.     Capillary Refill: Capillary refill takes less than 2 seconds.     Findings: Lesion present.     Comments: Large knee laceration that is hemostatic.  +2 DP pulse  Neurological:     Mental Status: She is alert. Mental status is at baseline.  Psychiatric:        Mood and Affect: Mood normal.       IMPRESSION / MDM / ASSESSMENT AND PLAN / ED COURSE  I reviewed the triage vital signs  and the nursing notes.  On chart review patient had knee replacement with Dr. Ernest Pine on 10/06/2023 and patient is not on anticoagulation.    No tachycardic or bradycardic dysrhythmias while on cardiac telemetry.  RADIOLOGY I independently reviewed imaging, my interpretation of imaging: Left knee x-ray no fracture.  LABS (all labs ordered are listed, but only abnormal results are displayed) Labs interpreted as -    Labs Reviewed - No data to display   MDM  Called and discussed the patient's case with orthopedics, Dr. Ernest Pine   Clinical Course as of 10/30/23 2017  Sat Oct 30, 2023  1946 Message Dr. Ernest Pine and again repaging Dr. Ernest Pine on-call with orthopedics.  Given a dose of IV pain medication. [SM]  2016 Discussed the patient's case with Dr. Ernest Pine, plan to go to the OR.  Patient made NPO.  Given  IV Ancef and another dose of IV morphine. [SM]    Clinical Course User Index [SM] Corena Herter, MD     PROCEDURES:  Critical Care performed: No  Procedures  Patient's presentation is most consistent with acute complicated illness / injury requiring diagnostic workup.   MEDICATIONS ORDERED IN ED: Medications  ondansetron (ZOFRAN) injection 4 mg (has no administration in time range)  morphine (PF) 4 MG/ML injection 6 mg (has no administration in time range)  ceFAZolin (ANCEF) IVPB 2g/100 mL premix (has no administration in time range)  morphine (PF) 4 MG/ML injection 4 mg (4 mg Intravenous Given 10/30/23 1953)    FINAL CLINICAL IMPRESSION(S) / ED DIAGNOSES   Final diagnoses:  Wound disruption, post-op, skin, initial encounter     Rx / DC Orders   ED Discharge Orders     None        Note:  This document was prepared using Dragon voice recognition software and may include unintentional dictation errors.   Corena Herter, MD 10/30/23 2011    Corena Herter, MD 10/30/23 2017

## 2023-10-30 NOTE — Group Note (Deleted)
 Date:  10/30/2023 Time:  9:53 PM  Group Topic/Focus:  Wrap-Up Group:   The focus of this group is to help patients review their daily goal of treatment and discuss progress on daily workbooks.     Participation Level:  {BHH PARTICIPATION WUJWJ:19147}  Participation Quality:  {BHH PARTICIPATION QUALITY:22265}  Affect:  {BHH AFFECT:22266}  Cognitive:  {BHH COGNITIVE:22267}  Insight: {BHH Insight2:20797}  Engagement in Group:  {BHH ENGAGEMENT IN WGNFA:21308}  Modes of Intervention:  {BHH MODES OF INTERVENTION:22269}  Additional Comments:  ***  Maglione,Angell Honse E 10/30/2023, 9:53 PM

## 2023-10-30 NOTE — H&P (Signed)
 ORTHOPAEDIC CONSULTATION  PATIENT NAME: Lisa Yu DOB: 01/13/1951  MRN: [id number]  REQUESTING PHYSICIAN: Corena Herter, MD  Chief Complaint: Left knee wound dehiscence  HPI: Lisa Yu is a 73 y.o. female who underwent left total knee arthroplasty on 10/06/2023.  The patient had been doing well postoperatively and progressing in physical therapy.  She was reevaluated in the office on 10/21/2023 and the skin staples were removed and Steri-Strips applied.  Earlier this evening the patient slipped on a toy and fell, landing directly on the anterior aspect of the left knee.  She sustained dehiscence of the surgical incision site.  She denied any other injuries.  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)   Diverticulosis    Essential hypertension    IBS (irritable bowel syndrome)    Iron deficiency anemia    Mixed hyperlipidemia    Osteopenia 12/21/2007   Primary osteoarthritis of left knee    Seizures (HCC)    1 in 2010   Vitamin B12 deficiency    Vitamin D deficiency    Past Surgical History:  Procedure Laterality Date   AUGMENTATION MAMMAPLASTY Bilateral 1998   removed 2013   BREAST SURGERY  1983   bilateral implants   CATARACT EXTRACTION W/PHACO Right 11/18/2020   Procedure: CATARACT EXTRACTION PHACO AND INTRAOCULAR LENS PLACEMENT (IOC) RIGHT PANOPTIX LENS 3.49 00:29.8;  Surgeon: Nevada Crane, MD;  Location: [facility];  Service: Ophthalmology;  Laterality: Right;   CATARACT EXTRACTION W/PHACO Left 12/02/2020   Procedure: CATARACT EXTRACTION PHACO AND INTRAOCULAR LENS PLACEMENT (IOC) LEFT;  Surgeon: Nevada Crane, MD;  Location: [facility];  Service: Ophthalmology;  Laterality: Left;  2.69 00:23.4   CHONDROPLASTY Right 05/13/2016   Procedure: CHONDROPLASTY;  Surgeon: Donato Heinz, MD;  Location: [facility];   Service: Orthopedics;  Laterality: Right;   COLONOSCOPY  05/01/2005   COLONOSCOPY WITH ESOPHAGOGASTRODUODENOSCOPY (EGD)  03/26/2020   FOOT SURGERY Bilateral    straightening toes (4th & 5th) with pins and then had the pins removed   GYNECOLOGIC CRYOSURGERY  1999   KNEE ARTHROPLASTY Right 04/26/2019   Procedure: COMPUTER ASSISTED TOTAL KNEE ARTHROPLASTY;  Surgeon: Donato Heinz, MD;  Location: [facility];  Service: Orthopedics;  Laterality: Right;   KNEE ARTHROPLASTY Left 10/06/2023   Procedure: ARTHROPLASTY, KNEE, TOTAL, USING IMAGELESS COMPUTER-ASSISTED NAVIGATION;  Surgeon: Donato Heinz, MD;  Location: [facility];  Service: Orthopedics;  Laterality: Left;   KNEE ARTHROSCOPY Right 05/13/2016   Procedure: ARTHROSCOPY KNEE WITH MEDIAL AND LATERAL MENISECTOMY;  Surgeon: Donato Heinz, MD;  Location: [facility];  Service: Orthopedics;  Laterality: Right;   LASIK  2007   MOUTH SURGERY     DENTAL IMPLANTS (TOP)   ROTATOR CUFF REPAIR Left 2011   VAGINAL DELIVERY     x 1   Social History   Socioeconomic History   Marital status: Married    Spouse name: Duanne Guess   Number of children: 1   Years of education: 12 th   Highest education level: Not on file  Occupational History   Occupation: Retired from [employer]  Tobacco Use   Smoking status: Never   Smokeless tobacco: Never  Vaping Use   Vaping status: Never Used  Substance and Sexual Activity   Alcohol use: Yes    Alcohol/week: 1.0  standard drink of alcohol    Types: 1 Standard drinks or equivalent per week    Comment: occassional mixed drink every 2 weeks   Drug use: No   Sexual activity: Not on file  Other Topics Concern   Not on file  Social History Narrative   Patient lives at home with her husband Duanne Guess).   Retired and she is self employed.   Education 12 th    Right handed.   Caffeine two cups of coffee one soda daily.      Would desire CPR, no prolonged life support if futile.   Social Drivers of Research scientist (physical sciences) Strain: Low Risk  (09/21/2023)   Received from [facility]   Overall Financial Resource Strain (CARDIA)    Difficulty of Paying Living Expenses: Not hard at all  Food Insecurity: No Food Insecurity (10/06/2023)   Hunger Vital Sign    Worried About Running Out of Food in the Last Year: Never true    Ran Out of Food in the Last Year: Never true  Transportation Needs: No Transportation Needs (10/06/2023)   PRAPARE - Administrator, Civil Service (Medical): No    Lack of Transportation (Non-Medical): No  Physical Activity: Not on file  Stress: Not on file  Social Connections: Socially Integrated (10/06/2023)   Social Connection and Isolation Panel [NHANES]    Frequency of Communication with Friends and Family: Twice a week    Frequency of Social Gatherings with Friends and Family: Twice a week    Attends Religious Services: 1 to 4 times per year    Active Member of [employer] or Organizations: Yes    Attends Banker Meetings: 1 to 4 times per year    Marital Status: Married   Family History  Problem Relation Age of Onset   Cancer Mother        skin   Breast cancer Neg Hx    Allergies  Allergen Reactions   Other Itching and Rash    Shellfish containing products Shrimp   Hydrocodone Rash   Penicillins Rash    IgE = 23 (WNL) on 04/12/2019 Did it involve swelling of the face/tongue/throat, SOB, or low BP? No Did it involve sudden or severe rash/hives, skin peeling, or any reaction on the inside of your mouth or nose? Yes Did you need to seek medical attention at a hospital or doctor's office? Yes When did it last happen?      in her 30s If all above answers are "NO", may proceed with cephalosporin use.    Prior to Admission medications   Medication Sig Start Date End Date Taking? Authorizing Provider  acetaminophen (TYLENOL) 500 MG tablet Take 1,000 mg by mouth every 6 (six) hours as needed for mild pain (pain score 1-3), moderate pain  (pain score 4-6) or headache.    [provider]  Ascorbic Acid (VITAMIN C PO) Take 500 mg by mouth daily.    [provider]  aspirin 81 MG tablet Take 1 tablet (81 mg total) by mouth in the morning and at bedtime. 10/07/23   Rayburn Go, PA-C  Calcium Carb-Cholecalciferol (SM OYSTER SHELL CALCIUM/VIT D) 500-10 MG-MCG TABS Take 1 tablet by mouth daily.    [provider]  celecoxib (CELEBREX) 200 MG capsule Take 1 capsule (200 mg total) by mouth 2 (two) times daily. 10/07/23   Rayburn Go, PA-C  chlorhexidine (PERIDEX) 0.12 % solution Use as directed 5 mLs in  the mouth or throat 2 (two) times daily. 09/07/23   [provider]  Cholecalciferol (VITAMIN D3) 250 MCG (10000 UT) capsule Take 10,000 Units by mouth daily.    [provider]  cyanocobalamin (VITAMIN B12) 1000 MCG tablet Take 1,000 mcg by mouth daily.    [provider]  finasteride (PROPECIA) 1 MG tablet Take 1 mg by mouth daily.    [provider]  hyoscyamine (LEVBID) 0.375 MG 12 hr tablet Take 0.375 mg by mouth every 12 (twelve) hours as needed for cramping.    [provider]  lamoTRIgine (LAMICTAL) 200 MG tablet Take 1 tablet (200 mg total) by mouth 2 (two) times daily. 05/24/17   Nilda Riggs, NP  losartan-hydrochlorothiazide (HYZAAR) 50-12.5 MG tablet Take 1 tablet by mouth daily.    [provider]  mirtazapine (REMERON) 15 MG tablet Take 1 tablet by mouth at bedtime. 09/21/23 09/20/24  [provider]  Multiple Vitamin (MULTIVITAMIN) tablet Take 1 tablet by mouth daily.    [provider]  oxyCODONE (OXY IR/ROXICODONE) 5 MG immediate release tablet Take 1 tablet (5 mg total) by mouth every 4 (four) hours as needed for moderate pain (pain score 4-6) (pain score 4-6). 10/07/23   Rayburn Go, PA-C  PARoxetine (PAXIL) 20 MG tablet Take 20 mg by mouth daily.    [provider]  traMADol (ULTRAM) 50 MG tablet Take  1-2 tablets (50-100 mg total) by mouth every 4 (four) hours as needed for moderate pain (pain score 4-6). 10/07/23   Rayburn Go, PA-C   DG Knee 2 Views Left Result Date: 10/30/2023 CLINICAL DATA:  Fall and left knee pain. EXAM: LEFT KNEE - 1-2 VIEW COMPARISON:  Left knee radiograph dated 10/06/2023. FINDINGS: There is a total left knee arthroplasty. The arthroplasty components appear intact and in anatomic alignment. There is no acute fracture or dislocation. No significant joint effusion. The soft tissues are grossly unremarkable. IMPRESSION: 1. No acute fracture or dislocation. 2. Total left knee arthroplasty. Electronically Signed   By: Elgie Collard M.D.   On: 10/30/2023 19:28    Positive ROS: All other systems have been reviewed and were otherwise negative with the exception of those mentioned in the HPI and as above.  Physical Exam: General: Well developed, well nourished female seen in no acute distress. HEENT: Atraumatic and normocephalic. Sclera are clear. Extraocular motion is intact. Oropharynx is clear with moist mucosa. Neck: Supple, nontender, good range of motion. No JVD or carotid bruits. Lungs: Clear to auscultation bilaterally. Cardiovascular: Regular rate and rhythm with normal S1 and S2. No murmurs. No gallops or rubs. Pedal pulses are palpable bilaterally. Homans test is negative bilaterally. No significant pretibial or ankle edema. Abdomen: Soft, nontender, and nondistended. Bowel sounds are present. Skin: No lesions in the area of chief complaint Neurologic: Awake, alert, and oriented. Sensory function is grossly intact. Motor strength is felt to be 5 over 5 bilaterally. No clonus or tremor. Good motor coordination. Lymphatic: No axillary or cervical lymphadenopathy  MUSCULOSKELETAL: Examination of the left knee demonstrates dehiscence of approximately 75% of the anterior knee incision.  Hemostasis is noted.  Moderate swelling of the leg is noted.  Range of motion was  not assessed.  Assessment: Traumatic dehiscence of the left knee incision Status post left total knee arthroplasty  Plan: The findings were discussed in detail with the patient.  She has already received Ancef in the emergency department.  I recommended formal irrigation and debridement of the knee in  the operating room.  The possible need for knee arthrotomy with polyethylene exchange was discussed with the patient depending upon the depth of the dehiscence.  The patient expressed her understanding of the risk benefits agree with plans for surgical intervention.  Given the open wound and the implants, I have declared this an emergency due to the increased risk of complications by delaying surgical invention.  Angel Hobdy P. Angie Fava M.D.

## 2023-10-30 NOTE — Anesthesia Procedure Notes (Signed)
 Procedure Name: Intubation Date/Time: 10/30/2023 10:01 PM  Performed by: Hezzie Bump, CRNAPre-anesthesia Checklist: Patient identified, Patient being monitored, Timeout performed, Emergency Drugs available and Suction available Patient Re-evaluated:Patient Re-evaluated prior to induction Oxygen Delivery Method: Circle system utilized Preoxygenation: Pre-oxygenation with 100% oxygen Induction Type: IV induction and Rapid sequence Laryngoscope Size: 3 and McGrath Grade View: Grade I Tube type: Oral Tube size: 7.0 mm Number of attempts: 1 Airway Equipment and Method: Stylet and Video-laryngoscopy Placement Confirmation: ETT inserted through vocal cords under direct vision, positive ETCO2 and breath sounds checked- equal and bilateral Secured at: 19 cm Tube secured with: Tape Dental Injury: Teeth and Oropharynx as per pre-operative assessment

## 2023-10-30 NOTE — ED Notes (Signed)
 OR Consent ready to be  signed per Dr.Hooten

## 2023-10-30 NOTE — ED Triage Notes (Signed)
 Pt Bib [facility] from home due to a fall on L knee that had a recent knee replacement aprox 3 weeks ago. Pt slipped on a toy and landed on left knee and previously placed sutures ripped apart.   EMS gave 100 mcg fent and 4 mg zofran. 93 RA 89 HR 187/87 B/P

## 2023-10-31 ENCOUNTER — Encounter: Payer: Self-pay | Admitting: Orthopedic Surgery

## 2023-10-31 ENCOUNTER — Other Ambulatory Visit: Payer: Self-pay

## 2023-10-31 DIAGNOSIS — T8131XD Disruption of external operation (surgical) wound, not elsewhere classified, subsequent encounter: Secondary | ICD-10-CM

## 2023-10-31 MED ORDER — ACETAMINOPHEN 10 MG/ML IV SOLN
1000.0000 mg | Freq: Four times a day (QID) | INTRAVENOUS | Status: DC
  Administered 2023-10-31: 1000 mg via INTRAVENOUS
  Filled 2023-10-31 (×3): qty 100

## 2023-10-31 MED ORDER — LOSARTAN POTASSIUM 50 MG PO TABS
50.0000 mg | ORAL_TABLET | Freq: Every day | ORAL | Status: DC
  Administered 2023-10-31: 50 mg via ORAL
  Filled 2023-10-31: qty 1

## 2023-10-31 MED ORDER — SODIUM CHLORIDE 0.9 % IV SOLN: INTRAVENOUS | Status: DC

## 2023-10-31 MED ORDER — ACETAMINOPHEN 325 MG PO TABS: 325.0000 mg | ORAL_TABLET | Freq: Four times a day (QID) | ORAL | Status: DC | PRN

## 2023-10-31 MED ORDER — LOSARTAN POTASSIUM-HCTZ 50-12.5 MG PO TABS: 1.0000 | ORAL_TABLET | Freq: Every day | ORAL | Status: DC

## 2023-10-31 MED ORDER — CEPHALEXIN 500 MG PO CAPS: 500.0000 mg | ORAL_CAPSULE | Freq: Three times a day (TID) | ORAL | 0 refills | Status: AC

## 2023-10-31 MED ORDER — MENTHOL 3 MG MT LOZG: 1.0000 | LOZENGE | OROMUCOSAL | Status: DC | PRN

## 2023-10-31 MED ORDER — TRANEXAMIC ACID-NACL 1000-0.7 MG/100ML-% IV SOLN
INTRAVENOUS | Status: AC
  Filled 2023-10-31: qty 100

## 2023-10-31 MED ORDER — FENTANYL CITRATE (PF) 100 MCG/2ML IJ SOLN
INTRAMUSCULAR | Status: AC
  Filled 2023-10-31: qty 2

## 2023-10-31 MED ORDER — PAROXETINE HCL 20 MG PO TABS
20.0000 mg | ORAL_TABLET | Freq: Every day | ORAL | Status: DC
  Administered 2023-10-31: 20 mg via ORAL
  Filled 2023-10-31: qty 1

## 2023-10-31 MED ORDER — FLEET ENEMA RE ENEM: 1.0000 | ENEMA | Freq: Once | RECTAL | Status: DC | PRN

## 2023-10-31 MED ORDER — CELECOXIB 200 MG PO CAPS
200.0000 mg | ORAL_CAPSULE | Freq: Two times a day (BID) | ORAL | Status: DC
  Administered 2023-10-31: 200 mg via ORAL
  Filled 2023-10-31: qty 1

## 2023-10-31 MED ORDER — ALUM & MAG HYDROXIDE-SIMETH 200-200-20 MG/5ML PO SUSP: 30.0000 mL | ORAL | Status: DC | PRN

## 2023-10-31 MED ORDER — SODIUM CHLORIDE 0.9 % IV BOLUS
500.0000 mL | Freq: Once | INTRAVENOUS | Status: AC
  Administered 2023-10-31: 500 mL via INTRAVENOUS

## 2023-10-31 MED ORDER — ACETAMINOPHEN 10 MG/ML IV SOLN: 1000.0000 mg | Freq: Once | INTRAVENOUS | Status: DC | PRN

## 2023-10-31 MED ORDER — ONDANSETRON HCL 4 MG PO TABS: 4.0000 mg | ORAL_TABLET | Freq: Four times a day (QID) | ORAL | Status: DC | PRN

## 2023-10-31 MED ORDER — SENNOSIDES-DOCUSATE SODIUM 8.6-50 MG PO TABS: 1.0000 | ORAL_TABLET | Freq: Two times a day (BID) | ORAL | Status: DC

## 2023-10-31 MED ORDER — OXYCODONE HCL 5 MG PO TABS
ORAL_TABLET | ORAL | Status: AC
  Filled 2023-10-31: qty 1

## 2023-10-31 MED ORDER — TRANEXAMIC ACID-NACL 1000-0.7 MG/100ML-% IV SOLN
1000.0000 mg | Freq: Once | INTRAVENOUS | Status: AC
  Administered 2023-10-31: 1000 mg via INTRAVENOUS

## 2023-10-31 MED ORDER — OXYCODONE HCL 5 MG/5ML PO SOLN: 5.0000 mg | Freq: Once | ORAL | Status: AC | PRN

## 2023-10-31 MED ORDER — DIPHENHYDRAMINE HCL 12.5 MG/5ML PO ELIX: 12.5000 mg | ORAL_SOLUTION | ORAL | Status: DC | PRN

## 2023-10-31 MED ORDER — HYDROCHLOROTHIAZIDE 12.5 MG PO TABS
12.5000 mg | ORAL_TABLET | Freq: Every day | ORAL | Status: DC
  Administered 2023-10-31: 12.5 mg via ORAL
  Filled 2023-10-31: qty 1

## 2023-10-31 MED ORDER — TRAMADOL HCL 50 MG PO TABS
50.0000 mg | ORAL_TABLET | ORAL | Status: DC | PRN
  Administered 2023-10-31 (×2): 100 mg via ORAL
  Filled 2023-10-31 (×2): qty 2

## 2023-10-31 MED ORDER — ONDANSETRON HCL 4 MG/2ML IJ SOLN: 4.0000 mg | Freq: Four times a day (QID) | INTRAMUSCULAR | Status: DC | PRN

## 2023-10-31 MED ORDER — BISACODYL 10 MG RE SUPP: 10.0000 mg | Freq: Every day | RECTAL | Status: DC | PRN

## 2023-10-31 MED ORDER — CEFAZOLIN SODIUM-DEXTROSE 2-4 GM/100ML-% IV SOLN
2.0000 g | Freq: Four times a day (QID) | INTRAVENOUS | Status: AC
  Administered 2023-10-31 (×2): 2 g via INTRAVENOUS
  Filled 2023-10-31 (×2): qty 100

## 2023-10-31 MED ORDER — ONDANSETRON HCL 4 MG/2ML IJ SOLN: 4.0000 mg | Freq: Once | INTRAMUSCULAR | Status: DC | PRN

## 2023-10-31 MED ORDER — FENTANYL CITRATE (PF) 100 MCG/2ML IJ SOLN
25.0000 ug | INTRAMUSCULAR | Status: AC | PRN
  Administered 2023-10-31: 50 ug via INTRAVENOUS
  Administered 2023-10-31 (×4): 25 ug via INTRAVENOUS
  Administered 2023-10-31: 50 ug via INTRAVENOUS

## 2023-10-31 MED ORDER — FERROUS SULFATE 325 (65 FE) MG PO TABS
325.0000 mg | ORAL_TABLET | Freq: Two times a day (BID) | ORAL | Status: DC
  Administered 2023-10-31: 325 mg via ORAL
  Filled 2023-10-31: qty 1

## 2023-10-31 MED ORDER — OXYCODONE HCL 5 MG PO TABS
5.0000 mg | ORAL_TABLET | Freq: Once | ORAL | Status: AC | PRN
  Administered 2023-10-31: 5 mg via ORAL

## 2023-10-31 MED ORDER — PHENOL 1.4 % MT LIQD: 1.0000 | OROMUCOSAL | Status: DC | PRN

## 2023-10-31 MED ORDER — FENTANYL CITRATE (PF) 100 MCG/2ML IJ SOLN
INTRAMUSCULAR | Status: AC
Start: 2023-10-31 — End: ?
  Filled 2023-10-31: qty 2

## 2023-10-31 MED ORDER — OXYCODONE HCL 5 MG PO TABS
10.0000 mg | ORAL_TABLET | ORAL | Status: DC | PRN
  Administered 2023-10-31: 10 mg via ORAL
  Filled 2023-10-31: qty 2

## 2023-10-31 MED ORDER — HYDROMORPHONE HCL 1 MG/ML IJ SOLN: 0.5000 mg | INTRAMUSCULAR | Status: DC | PRN

## 2023-10-31 MED ORDER — METOCLOPRAMIDE HCL 10 MG PO TABS
10.0000 mg | ORAL_TABLET | Freq: Three times a day (TID) | ORAL | Status: DC
  Administered 2023-10-31 (×2): 10 mg via ORAL
  Filled 2023-10-31 (×2): qty 1

## 2023-10-31 MED ORDER — SENNOSIDES-DOCUSATE SODIUM 8.6-50 MG PO TABS
1.0000 | ORAL_TABLET | Freq: Two times a day (BID) | ORAL | Status: DC
  Filled 2023-10-31: qty 1

## 2023-10-31 MED ORDER — MAGNESIUM HYDROXIDE 400 MG/5ML PO SUSP
30.0000 mL | Freq: Every day | ORAL | Status: DC
  Filled 2023-10-31: qty 30

## 2023-10-31 MED ORDER — ASPIRIN 81 MG PO CHEW: 81.0000 mg | CHEWABLE_TABLET | Freq: Two times a day (BID) | ORAL | Status: DC

## 2023-10-31 MED ORDER — OXYCODONE HCL 5 MG PO TABS: 5.0000 mg | ORAL_TABLET | ORAL | 0 refills | Status: AC | PRN

## 2023-10-31 MED ORDER — HYOSCYAMINE SULFATE ER 0.375 MG PO TB12
0.3750 mg | ORAL_TABLET | Freq: Two times a day (BID) | ORAL | Status: DC | PRN
Start: 2023-10-31 — End: 2023-10-31

## 2023-10-31 MED ORDER — FINASTERIDE 1 MG PO TABS: 1.0000 mg | ORAL_TABLET | Freq: Every day | ORAL | Status: DC

## 2023-10-31 MED ORDER — MIRTAZAPINE 15 MG PO TABS: 15.0000 mg | ORAL_TABLET | Freq: Every day | ORAL | Status: DC

## 2023-10-31 MED ORDER — PANTOPRAZOLE SODIUM 40 MG PO TBEC
40.0000 mg | DELAYED_RELEASE_TABLET | Freq: Two times a day (BID) | ORAL | Status: DC
  Administered 2023-10-31: 40 mg via ORAL
  Filled 2023-10-31: qty 1

## 2023-10-31 MED ORDER — SUGAMMADEX SODIUM 200 MG/2ML IV SOLN
INTRAVENOUS | Status: DC | PRN
  Administered 2023-10-31: 200 mg via INTRAVENOUS

## 2023-10-31 MED ORDER — OXYCODONE HCL 5 MG PO TABS: 5.0000 mg | ORAL_TABLET | ORAL | Status: DC | PRN

## 2023-10-31 NOTE — Discharge Summary (Signed)
 Physician Discharge Summary  Patient ID: Lisa Yu MRN: [id number] DOB/AGE: 1951/01/26 73 y.o.  Admit date: 10/30/2023 Discharge date: 10/31/2023  Admission Diagnoses:  Wound disruption, post-op, skin, initial encounter [T81.31XA] Wound dehiscence, surgical, subsequent encounter [T81.31XD] Traumatic dehiscence of the left knee incision Status post left total knee arthroplasty  Discharge Diagnoses: Patient Active Problem List   Diagnosis Date Noted   Wound dehiscence, surgical, subsequent encounter 10/31/2023   History of total knee arthroplasty, left 10/06/2023   Major depressive disorder, recurrent, mild (HCC) 09/15/2022   B12 deficiency 12/26/2020   Tubular adenoma 09/04/2020   Vitamin D deficiency 08/16/2019   Status post total right knee replacement 04/26/2019   Hyperlipidemia, mixed 10/17/2018   Low serum vitamin D 10/17/2018   Well woman exam without gynecological exam 06/16/2018   Lumbar radiculopathy 06/16/2018   Anxiety 10/27/2017   OA (osteoarthritis) of knee 03/22/2017   Grief 01/04/2017   Insomnia 01/16/2016   Complex partial seizure (HCC) 05/10/2014   IBS (irritable bowel syndrome) 11/30/2013   Postmenopausal HRT (hormone replacement therapy) 11/30/2013   Elevated blood pressure reading without diagnosis of hypertension 06/20/2013   Partial epilepsy with impairment of consciousness (HCC) 05/09/2013   Iron deficiency anemia, unspecified 01/23/2011   Convulsions (HCC) 02/25/2008   Diverticulosis of colon 12/27/2007   STATE, SYMPTOMATIC MENOPAUSE/FEM CLIMACTERIC 12/27/2007   SPONDYLOSIS, LUMBAR 12/27/2007   Disorder of bone and cartilage 12/27/2007   BREAST IMPLANTS, BILATERAL, HX OF 12/27/2007    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   Consultants (if any):   Discharged Condition: Improved  Hospital Course: Lisa Yu is an 73 y.o. female who was admitted 10/30/2023 with a diagnosis of a traumatic dehiscence of left knee incision in the setting of a left total knee arthroplasty and went to the operating room on 10/30/2023 - 10/31/2023 and underwent the above named procedures.    Surgeries: Procedure(s): IRRIGATION AND DEBRIDEMENT KNEE on 10/30/2023 - 10/31/2023 Patient tolerated the surgery well. Taken to PACU where she was stabilized and then transferred to the orthopedic floor.  Started on Aspirin 81mg  every 12 hrs. Heels elevated on bed with rolled towels. No evidence of DVT. Negative Homan. Physical therapy started on day #1 for gait training and transfer. OT started day #1 for ADL and assisted devices.  Patient's IV was removed on POD1.  Prevena sent home with patient at discharge.  Implants: None.  She was given perioperative antibiotics:  Anti-infectives (From admission, onward)    Start     Dose/Rate Route Frequency Ordered Stop   10/31/23 0400  ceFAZolin (ANCEF) IVPB 2g/100 mL premix        2 g 200 mL/hr over 30 Minutes Intravenous Every 6 hours 10/31/23 0208 10/31/23 1559   10/31/23 0000  cephALEXin (KEFLEX) 500 MG capsule        500 mg Oral 3 times daily 10/31/23 0835     10/30/23 2030  ceFAZolin (  ANCEF) IVPB 2g/100 mL premix        2 g 200 mL/hr over 30 Minutes Intravenous  Once 10/30/23 2016 10/30/23 2206     .  She was given sequential compression devices, early ambulation, and aspirin for DVT prophylaxis.  She benefited maximally from the hospital stay and there were no complications.    Recent vital signs:  Vitals:   10/31/23 0432 10/31/23 0726  BP: (!) 140/78 121/65  Pulse: 93  84  Resp: 18 16  Temp: 98.2 F (36.8 C) 98.9 F (37.2 C)  SpO2: 99% 98%    Recent laboratory studies:  Lab Results  Component Value Date   HGB 10.5 (L) 10/30/2023   HGB 13.2 04/12/2019   HGB 13.7 06/16/2018   Lab Results  Component Value Date   WBC 9.8 10/30/2023   PLT 406 (H) 10/30/2023   Lab Results  Component Value Date   INR 0.9 04/12/2019   Lab Results  Component Value Date   NA 140 10/30/2023   K 4.2 10/30/2023   CL 106 10/30/2023   CO2 25 10/30/2023   BUN 27 (H) 10/30/2023   CREATININE 0.81 10/30/2023   GLUCOSE 109 (H) 10/30/2023    Discharge Medications:   Allergies as of 10/31/2023       Reactions   Other Itching, Rash   Shellfish containing products Shrimp   Hydrocodone Rash   Penicillins Rash   IgE = 23 (WNL) on 04/12/2019 Did it involve swelling of the face/tongue/throat, SOB, or low BP? No Did it involve sudden or severe rash/hives, skin peeling, or any reaction on the inside of your mouth or nose? Yes Did you need to seek medical attention at a hospital or doctor's office? Yes When did it last happen?      in her 30s If all above answers are "NO", may proceed with cephalosporin use.        Medication List     TAKE these medications    acetaminophen 500 MG tablet Commonly known as: TYLENOL Take 1,000 mg by mouth every 6 (six) hours as needed for mild pain (pain score 1-3), moderate pain (pain score 4-6) or headache.   aspirin 81 MG tablet Take 1 tablet (81 mg total) by mouth in the morning and at bedtime.   celecoxib 200 MG capsule Commonly known as: CELEBREX Take 1 capsule (200 mg total) by mouth 2 (two) times daily.   cephALEXin 500 MG capsule Commonly known as: KEFLEX Take 1 capsule (500 mg total) by mouth 3 (three) times daily.   chlorhexidine 0.12 % solution Commonly known as: PERIDEX Use as directed 5 mLs in the mouth or throat 2 (two) times daily.   cyanocobalamin 1000 MCG tablet Commonly known as: VITAMIN B12 Take 1,000  mcg by mouth daily.   finasteride 1 MG tablet Commonly known as: PROPECIA Take 1 mg by mouth daily.   hyoscyamine 0.375 MG 12 hr tablet Commonly known as: LEVBID Take 0.375 mg by mouth every 12 (twelve) hours as needed for cramping.   lamoTRIgine 200 MG tablet Commonly known as: LAMICTAL Take 1 tablet (200 mg total) by mouth 2 (two) times daily.   losartan-hydrochlorothiazide 50-12.5 MG tablet Commonly known as: HYZAAR Take 1 tablet by mouth daily.   mirtazapine 15 MG tablet Commonly known as: REMERON Take 1 tablet by mouth at bedtime.   multivitamin tablet Take 1 tablet by mouth daily.   oxyCODONE 5 MG immediate release tablet Commonly known as: Oxy IR/ROXICODONE Take 1  tablet (5 mg total) by mouth every 4 (four) hours as needed for moderate pain (pain score 4-6) (pain score 4-6).   PARoxetine 20 MG tablet Commonly known as: PAXIL Take 20 mg by mouth daily.   SM Oyster Shell Calcium/Vit D 500-10 MG-MCG Tabs Generic drug: Calcium Carb-Cholecalciferol Take 1 tablet by mouth daily.   traMADol 50 MG tablet Commonly known as: ULTRAM Take 1-2 tablets (50-100 mg total) by mouth every 4 (four) hours as needed for moderate pain (pain score 4-6).   VITAMIN C PO Take 500 mg by mouth daily.   Vitamin D3 250 MCG (10000 UT) capsule Take 10,000 Units by mouth daily.               Durable Medical Equipment  (From admission, onward)           Start     Ordered   10/31/23 0208  DME Walker rolling  Once       Question:  Patient needs a walker to treat with the following condition  Answer:  Total knee replacement status   10/31/23 0208   10/31/23 0208  DME Bedside commode  Once       Comments: Patient is not able to walk the distance required to go the bathroom, or he/she is unable to safely negotiate stairs required to access the bathroom.  A 3in1 BSC will alleviate this problem  Question:  Patient needs a bedside commode to treat with the following condition   Answer:  Total knee replacement status   10/31/23 0208            Diagnostic Studies: DG Knee 2 Views Left Result Date: 10/30/2023 CLINICAL DATA:  Fall and left knee pain. EXAM: LEFT KNEE - 1-2 VIEW COMPARISON:  Left knee radiograph dated 10/06/2023. FINDINGS: There is a total left knee arthroplasty. The arthroplasty components appear intact and in anatomic alignment. There is no acute fracture or dislocation. No significant joint effusion. The soft tissues are grossly unremarkable. IMPRESSION: 1. No acute fracture or dislocation. 2. Total left knee arthroplasty. Electronically Signed   By: Elgie Collard M.D.   On: 10/30/2023 19:28   DG Knee Left Port Result Date: 10/06/2023 CLINICAL DATA:  Total knee replacement. EXAM: PORTABLE LEFT KNEE - 1-2 VIEW COMPARISON:  None Available. FINDINGS: Left knee arthroplasty in expected alignment. No periprosthetic lucency or fracture. There has been patellar resurfacing. Recent postsurgical change includes air and edema in the soft tissues and joint space. Anterior skin staples and suprapatellar drain in place. IMPRESSION: Left knee arthroplasty without immediate postoperative complication. Electronically Signed   By: Narda Rutherford M.D.   On: 10/06/2023 17:46   Disposition: Plan for discharge home today after successful completion of PT.   Follow-up Information     Rayburn Go, PA-C Follow up in 5 day(s).   Specialty: Orthopedic Surgery Why: Follow-up later this week for skin check. Contact information: [address] [place] [address] [phone]                Signed: Meriel Pica PA-C 10/31/2023, 8:36 AM

## 2023-10-31 NOTE — Evaluation (Signed)
 Physical Therapy Evaluation Patient Details Name: Lisa Yu MRN: [id number] DOB: November 22, 1950 Today's Date: 10/31/2023  History of Present Illness  Pt is a 73 year old female s/p L knee I and D after wound dehiscence.     PMH significant for left total knee arthroplasty on 10/06/2023  Clinical Impression  Pt has AMB twice this morning already, she is agreeable to my assessment in which she AMB >246ft then demonstrates abilities on stairs. Pt has all needed DME in place at home. Pt has adequate support at home as well. Pt reports confidence in home mobility needs at this point. No physical assist needed in my assessment today. Pt is ready for DC from PT standpoint.       If plan is discharge home, recommend the following:     Can travel by private vehicle        Equipment Recommendations None recommended by PT  Recommendations for Other Services       Functional Status Assessment Patient has had a recent decline in their functional status and demonstrates the ability to make significant improvements in function in a reasonable and predictable amount of time.     Precautions / Restrictions Precautions Precautions: Fall Recall of Precautions/Restrictions: Intact Restrictions LLE Weight Bearing Per Provider Order: Weight bearing as tolerated      Mobility  Bed Mobility                    Transfers Overall transfer level: Needs assistance Equipment used: Rolling walker (2 wheels) Transfers: Sit to/from Stand Sit to Stand: Supervision                Ambulation/Gait Ambulation/Gait assistance: Supervision Gait Distance (Feet): 200 Feet Assistive device: Rolling walker (2 wheels) Gait Pattern/deviations: WFL(Within Functional Limits), Step-through pattern Gait velocity: 0.47m/s        Stairs Stairs: Yes Stairs assistance: Supervision, Contact guard assist Stair Management: Two rails, One rail Right, Step to pattern, Forwards Number of Stairs: 10     Wheelchair Mobility     Tilt Bed    Modified Rankin (Stroke Patients Only)       Balance                                             Pertinent Vitals/Pain      Home Living Family/patient expects to be discharged to:: Private residence Living Arrangements: Spouse/significant other;Children Available Help at Discharge: Family;Available PRN/intermittently Type of Home: House Home Access: Stairs to enter Entrance Stairs-Rails: None Entrance Stairs-Number of Steps: 2   Home Layout: Two level;Able to live on main level with bedroom/bathroom Home Equipment: Grab bars - toilet;Grab bars - tub/shower;Hand held shower head;BSC/3in1;Rolling Walker (2 wheels);Cane - single point Additional Comments: dcing to son/DIL house    Prior Function Prior Level of Function : Independent/Modified Independent;History of Falls (last six months)             Mobility Comments: working with PT, pt reports she was not using any AD ADLs Comments: MOD I-I in ADL at baseline, PRN assist after TKA on 3/12     Extremity/Trunk Assessment   Upper Extremity Assessment Upper Extremity Assessment: Overall WFL for tasks assessed    Lower Extremity Assessment Lower Extremity Assessment: LLE deficits/detail LLE Deficits / Details: s/p L knee I and D wound vac in place  Communication   Communication Communication: No apparent difficulties    Cognition                                         Cueing       General Comments General comments (skin integrity, edema, etc.): After initial amb to toilet, HR 145 bpm however quickly recovered to 100-105 bpm, HR 110-120s bpm while amb, spo2 >90% on RA throughout    Exercises     Assessment/Plan    PT Assessment Patient needs continued PT services  PT Problem List Decreased range of motion;Decreased activity tolerance;Decreased balance;Decreased mobility       PT Treatment Interventions DME  instruction;Gait training;Stair training;Functional mobility training;Therapeutic activities;Therapeutic exercise    PT Goals (Current goals can be found in the Care Plan section)  Acute Rehab PT Goals Patient Stated Goal: DC to home today PT Goal Formulation: With patient Time For Goal Achievement: 11/14/23 Potential to Achieve Goals: Good    Frequency Min 3X/week     Co-evaluation               AM-PAC PT "6 Clicks" Mobility  Outcome Measure Help needed turning from your back to your side while in a flat bed without using bedrails?: None Help needed moving from lying on your back to sitting on the side of a flat bed without using bedrails?: None Help needed moving to and from a bed to a chair (including a wheelchair)?: None Help needed standing up from a chair using your arms (e.g., wheelchair or bedside chair)?: None Help needed to walk in hospital room?: A Little Help needed climbing 3-5 steps with a railing? : A Little 6 Click Score: 22    End of Session Equipment Utilized During Treatment: Gait belt Activity Tolerance: Patient tolerated treatment well;No increased pain Patient left: in chair;with call bell/phone within reach;with chair alarm set Nurse Communication: Mobility status PT Visit Diagnosis: Difficulty in walking, not elsewhere classified (R26.2);Other abnormalities of gait and mobility (R26.89);History of falling (Z91.81)    Time: 1610-9604 PT Time Calculation (min) (ACUTE ONLY): 14 min   Charges:   PT Evaluation $PT Eval Moderate Complexity: 1 Mod   PT General Charges $$ ACUTE PT VISIT: 1 Visit        11:37 AM, 10/31/23 Rosamaria Lints, PT, DPT Physical Therapist - [facility]  [phone] (ASCOM)    Kanna Dafoe C 10/31/2023, 11:35 AM

## 2023-10-31 NOTE — Progress Notes (Signed)
  Subjective: 1 Day Post-Op Procedure(s) (LRB): IRRIGATION AND DEBRIDEMENT KNEE (Left) Patient reports pain as mild.   Patient is well, and has had no acute complaints or problems Plan is to go Home after hospital stay. Negative for chest pain and shortness of breath Fever: no Gastrointestinal:Negative for nausea and vomiting  Objective: Vital signs in last 24 hours: Temp:  [97.2 F (36.2 C)-98.9 F (37.2 C)] 98.9 F (37.2 C) (04/06 0726) Pulse Rate:  [84-123] 84 (04/06 0726) Resp:  [12-20] 16 (04/06 0726) BP: (121-173)/(65-130) 121/65 (04/06 0726) SpO2:  [91 %-100 %] 98 % (04/06 0726) Weight:  [89.9 kg] 89.9 kg (04/05 1839)  Intake/Output from previous day:  Intake/Output Summary (Last 24 hours) at 10/31/2023 0831 Last data filed at 10/31/2023 0603 Gross per 24 hour  Intake 1700 ml  Output 190 ml  Net 1510 ml    Intake/Output this shift: No intake/output data recorded.  Labs: Recent Labs    10/30/23 2133  HGB 10.5*   Recent Labs    10/30/23 2133  WBC 9.8  RBC 3.49*  HCT 33.4*  PLT 406*   Recent Labs    10/30/23 2133  NA 140  K 4.2  CL 106  CO2 25  BUN 27*  CREATININE 0.81  GLUCOSE 109*  CALCIUM 9.1   No results for input(s): "LABPT", "INR" in the last 72 hours.   EXAM General - Patient is Alert, Appropriate, and Oriented Extremity - ABD soft Neurovascular intact Dorsiflexion/Plantar flexion intact Incision: Prevena intact without drainage this AM No cellulitis present Compartment soft Dressing/Incision - Prevena intact without drainage.  Bulky dressing was removed this AM. Motor Function - intact, moving foot and toes well on exam.  Abdomen soft with intact bowel sounds.  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   Weight as of this encounter: 89.9 kg. Advance diet Up with therapy  Vitals reviewed this AM. Bulky dressing removed.  Prevena intact, will discharge home with Prevena. Up with therapy today. If she does well with PT can d/c home today, has outpatient appointment tomorrow. Will discharge home on Keflex for 7 days as a precaution.  DVT Prophylaxis - Aspirin and TED hose Weight-Bearing as tolerated to left leg  J. Horris Latino, PA-C [facility] 10/31/2023, 8:31 AM

## 2023-10-31 NOTE — Anesthesia Postprocedure Evaluation (Signed)
 Anesthesia Post Note  Patient: Sharlene Dory  Procedure(s) Performed: IRRIGATION AND DEBRIDEMENT KNEE (Left: Knee)  Patient location during evaluation: PACU Anesthesia Type: General Level of consciousness: awake and alert Pain management: pain level controlled Vital Signs Assessment: post-procedure vital signs reviewed and stable Respiratory status: spontaneous breathing, nonlabored ventilation, respiratory function stable and patient connected to nasal cannula oxygen Cardiovascular status: blood pressure returned to baseline and stable Postop Assessment: no apparent nausea or vomiting Anesthetic complications: no   No notable events documented.   Last Vitals:  Vitals:   10/31/23 0214 10/31/23 0432  BP: (!) 146/76 (!) 140/78  Pulse: (!) 107 93  Resp: 16 18  Temp: 36.8 C 36.8 C  SpO2: 98% 99%    Last Pain:  Vitals:   10/31/23 0519  TempSrc:   PainSc: Asleep                 Corinda Gubler

## 2023-10-31 NOTE — Plan of Care (Signed)
  Problem: Clinical Measurements: Goal: Ability to maintain clinical measurements within normal limits will improve Outcome: Progressing   Problem: Activity: Goal: Risk for activity intolerance will decrease Outcome: Progressing   Problem: Nutrition: Goal: Adequate nutrition will be maintained Outcome: Progressing   Problem: Pain Managment: Goal: General experience of comfort will improve and/or be controlled Outcome: Progressing

## 2023-10-31 NOTE — Transfer of Care (Signed)
 Immediate Anesthesia Transfer of Care Note  Patient: Lisa Yu  Procedure(s) Performed: IRRIGATION AND DEBRIDEMENT KNEE (Left: Knee)  Patient Location: PACU  Anesthesia Type:General  Level of Consciousness: awake, alert , and oriented  Airway & Oxygen Therapy: Patient Spontanous Breathing and Patient connected to face mask oxygen  Post-op Assessment: Report given to RN and Post -op Vital signs reviewed and stable  Post vital signs: Reviewed and stable  Last Vitals:  Vitals Value Taken Time  BP    Temp    Pulse    Resp    SpO2      Last Pain:  Vitals:   10/30/23 2124  TempSrc:   PainSc: 10-Worst pain ever         Complications: No notable events documented.

## 2023-10-31 NOTE — Discharge Instructions (Signed)
 Diet: As you were doing prior to hospitalization   Shower:  May shower but keep the wounds dry, use an occlusive plastic wrap, NO SOAKING IN TUB.  If the bandage gets wet, change with a clean dry gauze.  Dressing:  Leave dressing on and intact until we see you in the office.   Activity:  Increase activity slowly as tolerated, but follow the weight bearing instructions below.  No lifting or driving for 6 weeks.  Weight Bearing:   Weight bearing as tolerated to right lower extremity  To prevent constipation: you may use a stool softener such as -  Colace (over the counter) 100 mg by mouth twice a day  Drink plenty of fluids (prune juice may be helpful) and high fiber foods Miralax (over the counter) for constipation as needed.    Itching:  If you experience itching with your medications, try taking only a single pain pill, or even half a pain pill at a time.  You may take up to 10 pain pills per day, and you can also use benadryl over the counter for itching or also to help with sleep.   Precautions:  If you experience chest pain or shortness of breath - call 911 immediately for transfer to the hospital emergency department!!  If you develop a fever greater that 101 F, purulent drainage from wound, increased redness or drainage from wound, or calf pain-Call [facility]                                              Follow- Up Appointment:  Please call for an appointment to be seen in 2 weeks at [facility]

## 2023-10-31 NOTE — Op Note (Signed)
 OPERATIVE NOTE  DATE OF SURGERY:  10/30/2023 - 10/31/2023  PATIENT NAME:  Lisa Yu   DOB: [birth year]  MRN: [id number]   PRE-OPERATIVE DIAGNOSIS:  Traumatic dehiscence of the left knee incision Status post left total knee arthroplasty  POST-OPERATIVE DIAGNOSIS:  Same  PROCEDURE: Irrigation and debridement of the left knee wound, closure of the wound dehiscence  SURGEON:  Jena Gauss., M.D.   ASSISTANT: None  ANESTHESIA: general  ESTIMATED BLOOD LOSS: 20 mL  FLUIDS REPLACED: 800 mL of crystalloid  TOURNIQUET TIME: 37   DRAINS: Wound VAC  INDICATIONS FOR SURGERY: Lisa Yu is a 73 y.o. year old female who underwent left total knee arthroplasty on 10/06/2023.  She had an uneventful postoperative course and was making progress in physical therapy.  She was reevaluated in the office on 10/21/2023 and the sutures were removed and Steri-Strips applied.  Earlier this evening she slipped on a toy that had been left on the floor and fell, landing directly on her left knee.  This resulted in dehiscence and approximately 75% of the surgical incision.   After discussion of the risks and benefits of surgical intervention, the patient expressed understanding of the risks benefits and agree with plans for irrigation and debridement of the left knee wound and closure.Marland Kitchen   PROCEDURE IN DETAIL: The patient was brought into the operating room and, after adequate general endotracheal anesthesia was achieved, tourniquet was placed on the patient's upper left thigh.  The patient's left knee and leg were cleaned and prepped with Betadine and draped in the usual sterile fashion.  A "timeout" was performed as per usual protocol.  The left lower extremity was exsanguinated using Esmarch, and the tourniquet was inflated to 300 mmHg.  Inspection of the wound demonstrated dehiscence of approximately 15 to 16 cm.  The subcutaneous tissue medially was still intact.  Laterally there was separation of  subcutaneous tissue from the underlying fascia.  A large hematoma was evacuated.  The deep fascia appeared to be intact without communication to the joint.  The wound was then irrigated with 3 L of normal saline with antibiotic solution.  The Versajet was then used to further debride the soft tissue laterally.  This was followed by irrigation of an additional 3 L of normal saline with antibiotic solution using pulsatile lavage.  450 mL of Surgiphor was used to irrigate the wound and allowed to soak for approximately 5 minutes.  Finally, an additional 3 L of normal saline with family solution was used to irrigate the wound using pulsatile lavage for a total of 9 L of normal saline.  The tourniquet was then deflated after total tourniquet time of 37 minutes.  Hemostasis was achieved using electrocautery.  The subcutaneous tissue was approximated in layers using first #0 Monocryl followed by #2-0 Monocryl.  The skin was closed with skin staples.  A 20 cm Prevena wound VAC was applied to the wound.  The patient tolerated the procedure well.  She was transported to the recovery room in stable condition.   Montrail Mehrer P. Angie Fava M.D.

## 2023-10-31 NOTE — Evaluation (Signed)
 Occupational Therapy Evaluation Patient Details Name: Lisa Yu MRN: [id number] DOB: 07-Feb-1951 Today's Date: 10/31/2023   History of Present Illness   Pt is a 73 year old female s/p L knee I and D after wound dehiscence.     PMH significant for left total knee arthroplasty on 10/06/2023     Clinical Impressions Chart reviewed, pt greeted semi supine in bed, agreeable to OT evaluation. Pt reports she was doing well postoperatively working with PT. Pt reports PRN assist for ADL and IADL since surgery. Pt performs bed mobility with supervision, STS with supervision-CGA, abm to bathroom with toilet transfer to bsc with supervision-CGA, amb 200' with RW with supervision-CGA. LB dressing completed with MAX A, pt reports she will have assist. Pt reports she plans to discharge to son and DIL house. Pt is left in bedside chair, all needs met. OT will follow acutely but anticipate no further OT needs after discharge.      If plan is discharge home, recommend the following:   A little help with bathing/dressing/bathroom;Assistance with cooking/housework;Help with stairs or ramp for entrance     Functional Status Assessment   Patient has had a recent decline in their functional status and demonstrates the ability to make significant improvements in function in a reasonable and predictable amount of time.     Equipment Recommendations   None recommended by OT;Other (comment) (pt has recommended equipment)     Recommendations for Other Services         Precautions/Restrictions   Precautions Precautions: Fall Recall of Precautions/Restrictions: Intact Restrictions Weight Bearing Restrictions Per Provider Order: Yes LLE Weight Bearing Per Provider Order: Weight bearing as tolerated     Mobility Bed Mobility Overal bed mobility: Needs Assistance Bed Mobility: Supine to Sit     Supine to sit: Supervision, HOB elevated, Used rails          Transfers Overall transfer  level: Needs assistance Equipment used: Rolling walker (2 wheels) Transfers: Sit to/from Stand Sit to Stand: Contact guard assist                  Balance Overall balance assessment: Needs assistance Sitting-balance support: Feet supported Sitting balance-Leahy Scale: Good     Standing balance support: Bilateral upper extremity supported, During functional activity, Reliant on assistive device for balance Standing balance-Leahy Scale: Good                             ADL either performed or assessed with clinical judgement   ADL Overall ADL's : Needs assistance/impaired Eating/Feeding: Set up   Grooming: Set up               Lower Body Dressing: Maximal assistance Lower Body Dressing Details (indicate cue type and reason): reports family will assist as needed, discussed AE Toilet Transfer: Supervision/safety;Contact guard assist;Rolling walker (2 wheels);BSC/3in1;Ambulation Toilet Transfer Details (indicate cue type and reason): BSC over toilet         Functional mobility during ADLs: Supervision/safety;Rolling walker (2 wheels);Contact guard assist (approx 200' with RW)       Vision Patient Visual Report: No change from baseline       Perception         Praxis         Pertinent Vitals/Pain Pain Assessment Pain Assessment: 0-10 Pain Score: 3  Pain Location: L knee Pain Descriptors / Indicators: Discomfort Pain Intervention(s): Limited activity within patient's tolerance  Extremity/Trunk Assessment Upper Extremity Assessment Upper Extremity Assessment: Overall WFL for tasks assessed   Lower Extremity Assessment Lower Extremity Assessment: LLE deficits/detail LLE Deficits / Details: s/p L knee I and D wound vac in place       Communication Communication Communication: No apparent difficulties   Cognition Arousal: Alert Behavior During Therapy: WFL for tasks assessed/performed Cognition: No apparent impairments                                Following commands: Intact       Cueing  General Comments   Cueing Techniques: Verbal cues  After initial amb to toilet, HR 145 bpm however quickly recovered to 100-105 bpm, HR 110-120s bpm while amb, spo2 >90% on RA throughout   Exercises Other Exercises Other Exercises: edu re: role of OT, role of rehab, discharge recommendations   Shoulder Instructions      Home Living Family/patient expects to be discharged to:: Private residence Living Arrangements: Spouse/significant other;Children Available Help at Discharge: Family;Available PRN/intermittently Type of Home: House Home Access: Stairs to enter [employer] of Steps: 2 Entrance Stairs-Rails: None Home Layout: Two level;Able to live on main level with bedroom/bathroom     Bathroom Shower/Tub: Producer, television/film/video: Standard Bathroom Accessibility: Yes How Accessible: Accessible via walker Home Equipment: Grab bars - toilet;Grab bars - tub/shower;Hand held shower head;BSC/3in1;Rolling Walker (2 wheels);Cane - single point   Additional Comments: dcing to son/DIL house      Prior Functioning/Environment Prior Level of Function : Independent/Modified Independent;History of Falls (last six months)             Mobility Comments: working with PT, pt reports she was not using any AD ADLs Comments: MOD I-I in ADL at baseline, PRN assist after TKA on 3/12    OT Problem List: Decreased activity tolerance;Impaired balance (sitting and/or standing)   OT Treatment/Interventions: Self-care/ADL training;DME and/or AE instruction;Therapeutic exercise;Canalith reposition;Balance training;Therapeutic activities      OT Goals(Current goals can be found in the care plan section)   Acute Rehab OT Goals Patient Stated Goal: go home OT Goal Formulation: With patient Time For Goal Achievement: 11/14/23 Potential to Achieve Goals: Good ADL Goals Pt Will Perform Lower  Body Dressing: with modified independence;sitting/lateral leans;sit to/from stand Pt Will Transfer to Toilet: with modified independence;ambulating Pt Will Perform Toileting - Clothing Manipulation and hygiene: with modified independence;sitting/lateral leans;sit to/from stand   OT Frequency:  Min 2X/week    Co-evaluation              AM-PAC OT "6 Clicks" Daily Activity     Outcome Measure Help from another person eating meals?: None Help from another person taking care of personal grooming?: None Help from another person toileting, which includes using toliet, bedpan, or urinal?: None Help from another person bathing (including washing, rinsing, drying)?: A Little Help from another person to put on and taking off regular upper body clothing?: None Help from another person to put on and taking off regular lower body clothing?: A Little 6 Click Score: 22   End of Session Equipment Utilized During Treatment: Rolling walker (2 wheels) Nurse Communication: Mobility status  Activity Tolerance: Patient tolerated treatment well Patient left: in chair;with call bell/phone within reach  OT Visit Diagnosis: Other abnormalities of gait and mobility (R26.89)                Time: 1610-9604 OT Time Calculation (  min): 29 min Charges:  OT General Charges $OT Visit: 1 Visit OT Evaluation $OT Eval Low Complexity: 1 Low  Oleta Mouse, OTD OTR/L  10/31/23, 10:28 AM

## 2024-04-19 ENCOUNTER — Ambulatory Visit
Admission: RE | Admit: 2024-04-19 | Discharge: 2024-04-19 | Disposition: A | Discharge status: Home / Self Care | Source: Ambulatory Visit | Attending: Internal Medicine | Admitting: Internal Medicine

## 2024-04-19 ENCOUNTER — Other Ambulatory Visit: Payer: Self-pay | Admitting: Internal Medicine

## 2024-04-19 DIAGNOSIS — R000 Tachycardia, unspecified: Secondary | ICD-10-CM | POA: Insufficient documentation

## 2024-04-19 DIAGNOSIS — R0609 Other forms of dyspnea: Secondary | ICD-10-CM | POA: Insufficient documentation

## 2024-04-19 MED ORDER — IOHEXOL 350 MG/ML SOLN
75.0000 mL | Freq: Once | INTRAVENOUS | Status: AC | PRN
  Administered 2024-04-19: 75 mL via INTRAVENOUS
